# Patient Record
Sex: Female | Born: 1950 | Race: Black or African American | Hispanic: No | Marital: Married | Smoking: Former smoker
Health system: Southern US, Community
[De-identification: ages and names within clinical notes are randomized; demographics above are authoritative.]

## PROBLEM LIST (undated history)

## (undated) DIAGNOSIS — K635 Polyp of colon: Secondary | ICD-10-CM

## (undated) DIAGNOSIS — M1990 Unspecified osteoarthritis, unspecified site: Secondary | ICD-10-CM

## (undated) DIAGNOSIS — J4520 Mild intermittent asthma, uncomplicated: Secondary | ICD-10-CM

## (undated) DIAGNOSIS — Z87442 Personal history of urinary calculi: Secondary | ICD-10-CM

## (undated) HISTORY — PX: CYST EXCISION: SHX5701

## (undated) HISTORY — DX: Polyp of colon: K63.5

## (undated) HISTORY — PX: ECTOPIC PREGNANCY SURGERY: SHX613

## (undated) HISTORY — DX: Personal history of urinary calculi: Z87.442

## (undated) HISTORY — PX: COLONOSCOPY: SHX174

## (undated) HISTORY — PX: OVARIAN CYST REMOVAL: SHX89

---

## 1982-05-12 HISTORY — PX: TOTAL ABDOMINAL HYSTERECTOMY: SHX209

## 1998-11-29 ENCOUNTER — Other Ambulatory Visit: Admission: RE | Admit: 1998-11-29 | Discharge: 1998-11-29 | Payer: Self-pay | Admitting: *Deleted

## 1998-12-25 ENCOUNTER — Ambulatory Visit (HOSPITAL_COMMUNITY): Admission: RE | Admit: 1998-12-25 | Discharge: 1998-12-25 | Payer: Self-pay | Admitting: *Deleted

## 1999-01-15 ENCOUNTER — Emergency Department (HOSPITAL_COMMUNITY): Admission: EM | Admit: 1999-01-15 | Discharge: 1999-01-15 | Payer: Self-pay | Admitting: Emergency Medicine

## 1999-01-15 ENCOUNTER — Encounter: Payer: Self-pay | Admitting: Emergency Medicine

## 1999-01-26 ENCOUNTER — Other Ambulatory Visit: Admission: RE | Admit: 1999-01-26 | Discharge: 1999-01-26 | Payer: Self-pay | Admitting: *Deleted

## 1999-01-26 ENCOUNTER — Encounter (INDEPENDENT_AMBULATORY_CARE_PROVIDER_SITE_OTHER): Payer: Self-pay

## 2002-10-18 ENCOUNTER — Encounter: Payer: Self-pay | Admitting: Emergency Medicine

## 2002-10-18 ENCOUNTER — Emergency Department (HOSPITAL_COMMUNITY): Admission: EM | Admit: 2002-10-18 | Discharge: 2002-10-18 | Payer: Self-pay | Admitting: Emergency Medicine

## 2005-10-10 ENCOUNTER — Encounter (INDEPENDENT_AMBULATORY_CARE_PROVIDER_SITE_OTHER): Payer: Self-pay | Admitting: *Deleted

## 2005-10-10 ENCOUNTER — Ambulatory Visit (HOSPITAL_COMMUNITY): Admission: RE | Admit: 2005-10-10 | Discharge: 2005-10-10 | Payer: Self-pay | Admitting: Gastroenterology

## 2005-10-30 ENCOUNTER — Inpatient Hospital Stay (HOSPITAL_COMMUNITY): Admission: AD | Admit: 2005-10-30 | Discharge: 2005-10-30 | Payer: Self-pay | Admitting: Obstetrics and Gynecology

## 2005-12-03 ENCOUNTER — Ambulatory Visit (HOSPITAL_COMMUNITY): Admission: RE | Admit: 2005-12-03 | Discharge: 2005-12-03 | Payer: Self-pay | Admitting: Obstetrics & Gynecology

## 2006-02-17 ENCOUNTER — Encounter: Admission: RE | Admit: 2006-02-17 | Discharge: 2006-05-18 | Payer: Self-pay | Admitting: Internal Medicine

## 2006-07-31 ENCOUNTER — Emergency Department (HOSPITAL_COMMUNITY): Admission: EM | Admit: 2006-07-31 | Discharge: 2006-07-31 | Payer: Self-pay | Admitting: Family Medicine

## 2007-03-16 ENCOUNTER — Ambulatory Visit (HOSPITAL_COMMUNITY): Admission: RE | Admit: 2007-03-16 | Discharge: 2007-03-16 | Payer: Self-pay | Admitting: Obstetrics & Gynecology

## 2008-07-07 ENCOUNTER — Ambulatory Visit (HOSPITAL_COMMUNITY): Admission: RE | Admit: 2008-07-07 | Discharge: 2008-07-07 | Payer: Self-pay | Admitting: Obstetrics & Gynecology

## 2009-01-05 ENCOUNTER — Emergency Department (HOSPITAL_COMMUNITY): Admission: EM | Admit: 2009-01-05 | Discharge: 2009-01-05 | Payer: Self-pay | Admitting: Emergency Medicine

## 2009-07-12 ENCOUNTER — Ambulatory Visit (HOSPITAL_COMMUNITY): Admission: RE | Admit: 2009-07-12 | Discharge: 2009-07-12 | Payer: Self-pay | Admitting: Internal Medicine

## 2010-03-29 DIAGNOSIS — K635 Polyp of colon: Secondary | ICD-10-CM

## 2010-03-29 HISTORY — DX: Polyp of colon: K63.5

## 2010-07-22 ENCOUNTER — Other Ambulatory Visit: Payer: Self-pay | Admitting: Obstetrics & Gynecology

## 2010-07-22 DIAGNOSIS — Z1231 Encounter for screening mammogram for malignant neoplasm of breast: Secondary | ICD-10-CM

## 2010-07-31 ENCOUNTER — Ambulatory Visit (HOSPITAL_COMMUNITY)
Admission: RE | Admit: 2010-07-31 | Discharge: 2010-07-31 | Disposition: A | Discharge status: Home / Self Care | Payer: BC Managed Care – PPO | Source: Ambulatory Visit | Attending: Obstetrics & Gynecology | Admitting: Obstetrics & Gynecology

## 2010-07-31 DIAGNOSIS — Z1231 Encounter for screening mammogram for malignant neoplasm of breast: Secondary | ICD-10-CM | POA: Insufficient documentation

## 2010-09-27 NOTE — Op Note (Signed)
[name], [name]               ACCOUNT NO.:  [id number]   MEDICAL RECORD NO.:  [id number]          PATIENT TYPE:  AMB   LOCATION:  ENDO                         FACILITY:  [facility]   PHYSICIAN:  Anselmo Rod, M.D.  DATE OF BIRTH:  12-29-1950   DATE OF PROCEDURE:  10/10/2005  DATE OF DISCHARGE:                                 OPERATIVE REPORT   PROCEDURE:  Colonoscopy with snare polypectomy x2 and cold biopsies x8.   ENDOSCOPIST:  Anselmo Rod, M.D.   INSTRUMENT USED:  Olympus videocolonoscope.   INDICATIONS FOR PROCEDURE:  The patient is a [name] with a family history of cancerous polyp in her sister resulting in a  partial hemicolectomy.  She is undergoing screening colonoscopy to rule out  colonic polyps, masses, etc.   PREPROCEDURE PREPARATION:  Informed consent was procured from the patient.  The patient fasted for four hours prior to the procedure and prepped with  OsmoPrep pill the night of and the morning of the procedure.  The risks and  benefits of the procedure, including a 10% missed rate of cancer and polyp  were discussed with the patient as well.   PREPROCEDURE PHYSICAL EXAMINATION:  VITAL SIGNS:  Stable.  NECK:  Supple.  CHEST:  Clear to auscultation.  S1 and S2 regular.  ABDOMEN:  Obese, nontender, with normal bowel sounds.   DESCRIPTION OF PROCEDURE:  The patient was placed in the left lateral  decubitus position and sedated with a 175 mcg of Fentanyl and 14 mg of  Versed in slow incremental doses.  Once the patient was adequately sedated  and maintained on low-flow oxygen and continuous cardiac monitoring, the  Olympus videocolonoscope was advanced from the rectum to the proximal right  colon with extreme difficulty because of the patient's large abdomen.  The  patient's position was changed from the left lateral to the supine and right  lateral position and then the prone position on several occasions to reach  the cecal  base.  Gentle abdominal pressure was applied.  In spite of all of  these efforts, the cecal base was not visualized.  The IC valve was  visualized.  There was a prominent fold close the valve that was biopsied  for pathology.  Another prominent fold in the mid-right colon was biopsied  for pathology to rule out dysplasia.  There a single isolated diverticulum  seen in the proximal right colon.  The terminal ileum was not visualized.  Retroflexion in the rectum revealed no abnormalities.  A small sessile polyp  was removed via hot snare from the rectum.  A large pedunculated polyp was  snared from the mid-right colon and was removed by Amy [name] retrieval net, as it  could not be suctioned through the scope.  The patient tolerated the  procedure well without immediate complications.  It was a prolonged,  complicated procedure.   IMPRESSION:  1.  One sessile polyp removed via hot snare from the rectum.  2.  Large pedunculated polyp removed via hot snare with a cut and coag  technique (220) from the mid-right colon and retrieved by Amy [name] retrieval      net.  3.  Two prominent folds biopsied from the proximal and the mid-right colon.  4.  Cecal base not visualized.   RECOMMENDATIONS:  1.  Await pathology results.  2.  Avoid all nonsteroidals, including aspirin, for the next four weeks.  3.  Outpatient followup in the next two weeks for further recommendations.      Anselmo Rod, M.D.  Electronically Signed     JNM/MEDQ  D:  10/10/2005  T:  10/10/2005  Job:  [id number]   cc:   Amy [name], M.D.  Fax: [phone]

## 2010-10-28 ENCOUNTER — Ambulatory Visit (HOSPITAL_COMMUNITY)
Admission: RE | Admit: 2010-10-28 | Discharge: 2010-10-28 | Disposition: A | Discharge status: Home / Self Care | Payer: BC Managed Care – PPO | Source: Ambulatory Visit | Attending: Urology | Admitting: Urology

## 2010-10-28 DIAGNOSIS — E119 Type 2 diabetes mellitus without complications: Secondary | ICD-10-CM | POA: Insufficient documentation

## 2010-10-28 DIAGNOSIS — N200 Calculus of kidney: Secondary | ICD-10-CM | POA: Insufficient documentation

## 2010-10-28 LAB — CBC
HCT: 39.8 % (ref 36.0–46.0)
Hemoglobin: 13 g/dL (ref 12.0–15.0)
MCH: 27.8 pg (ref 26.0–34.0)
MCHC: 32.7 g/dL (ref 30.0–36.0)
MCV: 85.2 fL (ref 78.0–100.0)
Platelets: 248 10*3/uL (ref 150–400)
RBC: 4.67 MIL/uL (ref 3.87–5.11)
RDW: 12.9 % (ref 11.5–15.5)
WBC: 6.5 10*3/uL (ref 4.0–10.5)

## 2010-10-28 LAB — BASIC METABOLIC PANEL
BUN: 12 mg/dL (ref 6–23)
CO2: 26 mEq/L (ref 19–32)
Calcium: 9.8 mg/dL (ref 8.4–10.5)
Chloride: 105 mEq/L (ref 96–112)
Creatinine, Ser: 0.47 mg/dL — ABNORMAL LOW (ref 0.50–1.10)
GFR calc Af Amer: >60 mL/min (ref >60)
GFR calc non Af Amer: >60 mL/min (ref >60)
Glucose, Bld: 133 mg/dL — ABNORMAL HIGH (ref 70–99)
Potassium: 3.6 mEq/L (ref 3.5–5.1)
Sodium: 142 mEq/L (ref 135–145)

## 2010-10-28 LAB — GLUCOSE, CAPILLARY: Glucose-Capillary: 140 mg/dL — ABNORMAL HIGH (ref 70–99)

## 2011-05-20 ENCOUNTER — Emergency Department (HOSPITAL_COMMUNITY)
Admission: EM | Admit: 2011-05-20 | Discharge: 2011-05-20 | Disposition: A | Discharge status: Home / Self Care | Payer: BC Managed Care – PPO | Attending: Emergency Medicine | Admitting: Emergency Medicine

## 2011-05-20 ENCOUNTER — Encounter: Payer: Self-pay | Admitting: *Deleted

## 2011-05-20 DIAGNOSIS — Z79899 Other long term (current) drug therapy: Secondary | ICD-10-CM | POA: Insufficient documentation

## 2011-05-20 DIAGNOSIS — R002 Palpitations: Secondary | ICD-10-CM | POA: Insufficient documentation

## 2011-05-20 DIAGNOSIS — E119 Type 2 diabetes mellitus without complications: Secondary | ICD-10-CM | POA: Insufficient documentation

## 2011-05-20 DIAGNOSIS — M546 Pain in thoracic spine: Secondary | ICD-10-CM | POA: Insufficient documentation

## 2011-05-20 DIAGNOSIS — R079 Chest pain, unspecified: Secondary | ICD-10-CM | POA: Insufficient documentation

## 2011-05-20 DIAGNOSIS — R000 Tachycardia, unspecified: Secondary | ICD-10-CM | POA: Insufficient documentation

## 2011-05-20 LAB — CBC
HCT: 40.3 % (ref 36.0–46.0)
Hemoglobin: 13.4 g/dL (ref 12.0–15.0)
MCH: 28.7 pg (ref 26.0–34.0)
MCHC: 33.3 g/dL (ref 30.0–36.0)
MCV: 86.3 fL (ref 78.0–100.0)
Platelets: 223 10*3/uL (ref 150–400)
RBC: 4.67 MIL/uL (ref 3.87–5.11)
RDW: 13.4 % (ref 11.5–15.5)
WBC: 6.2 10*3/uL (ref 4.0–10.5)

## 2011-05-20 LAB — BASIC METABOLIC PANEL
BUN: 10 mg/dL (ref 6–23)
CO2: 26 mEq/L (ref 19–32)
Calcium: 9.6 mg/dL (ref 8.4–10.5)
Chloride: 104 mEq/L (ref 96–112)
Creatinine, Ser: 0.43 mg/dL — ABNORMAL LOW (ref 0.50–1.10)
GFR calc Af Amer: >90 mL/min (ref >90)
GFR calc non Af Amer: >90 mL/min (ref >90)
Glucose, Bld: 137 mg/dL — ABNORMAL HIGH (ref 70–99)
Potassium: 3.7 mEq/L (ref 3.5–5.1)
Sodium: 140 mEq/L (ref 135–145)

## 2011-05-20 LAB — POCT I-STAT TROPONIN I: Troponin i, poc: 0 ng/mL (ref 0.00–0.08)

## 2011-05-20 LAB — DIFFERENTIAL
Basophils Absolute: 0 10*3/uL (ref 0.0–0.1)
Basophils Relative: 1 % (ref 0–1)
Eosinophils Absolute: 0.2 10*3/uL (ref 0.0–0.7)
Eosinophils Relative: 4 % (ref 0–5)
Lymphocytes Relative: 33 % (ref 12–46)
Lymphs Abs: 2 10*3/uL (ref 0.7–4.0)
Monocytes Absolute: 0.4 10*3/uL (ref 0.1–1.0)
Monocytes Relative: 7 % (ref 3–12)
Neutro Abs: 3.5 10*3/uL (ref 1.7–7.7)
Neutrophils Relative %: 56 % (ref 43–77)

## 2011-05-20 LAB — D-DIMER, QUANTITATIVE: D-Dimer, Quant: <0.22 ug/mL-FEU (ref 0.00–0.48)

## 2011-05-20 NOTE — ED Notes (Signed)
States last weekend c/o increased heartrate when lying down. States this has been going on x 3-4 weeks. Lasted longer today. C/o pain in her right upper rib and posterior scapula area.

## 2011-05-20 NOTE — ED Provider Notes (Signed)
History     CSN: [id number]  Arrival date & time 05/20/11  0536   First MD Initiated Contact with Patient 05/20/11 [phone]      Chief Complaint  Patient presents with  . Tachycardia    (Consider location/radiation/quality/duration/timing/severity/associated sxs/prior treatment) Patient is a 61 y.o. female presenting with palpitations. The history is provided by the patient.  Palpitations  This is a new problem. The current episode started more than 2 days ago. The problem occurs every several days. The problem has not changed since onset.The problem is associated with stress. On average, each episode lasts 5 minutes. Associated symptoms include irregular heartbeat and back pain. Pertinent negatives include no diaphoresis, no fever, no chest pain, no chest pressure, no exertional chest pressure, no near-syncope, no syncope, no abdominal pain, no nausea, no vomiting, no headaches, no leg pain, no lower extremity edema, no dizziness, no weakness, no cough, no hemoptysis and no shortness of breath. Risk factors include no known risk factors. Her past medical history does not include heart disease.   States she has two to three episodes in the past several weeks of palpitations. These have occurred at night when lying down. States that the first was a sensation as if her heart was racing and the other two made her feel as if her heart was pounding in her chest. The episodes each lasted for less than 5 minutes. There are no known aggravating/alleviating factors. She has had no associated chest pain, tightness, shortness of breath. Denies nausea, vomiting, diaphoresis. She has never had anything like this before.   Additionally, she had a brief period yesterday of right sided mid rib pain. This was described as sharp and stabbing and lasted for several seconds. She later had some pain in the right upper back. This pain resolved and has not recurred since.  Patient has no prior history of DVT/PE. Denies  recent trauma, surgery, or prolonged immobilization. Denies hemoptysis or use of exogenous estrogen. Has not had any leg swelling.  Past Medical History  Diagnosis Date  . Diabetes mellitus     Past Surgical History  Procedure Date  . Abdominal hysterectomy   . Ectopic pregnancy surgery     History reviewed. No pertinent family history.  History  Substance Use Topics  . Smoking status: Former Games developer  . Smokeless tobacco: Not on file  . Alcohol Use: Yes    OB History    Grav Para Term Preterm Abortions TAB SAB Ect Mult Living                  Review of Systems  Constitutional: Negative for fever, diaphoresis and appetite change.  HENT: Negative for congestion, rhinorrhea and trouble swallowing.   Eyes: Negative for visual disturbance.  Respiratory: Negative for cough, hemoptysis, chest tightness and shortness of breath.   Cardiovascular: Positive for palpitations. Negative for chest pain, leg swelling, syncope and near-syncope.  Gastrointestinal: Negative for nausea, vomiting and abdominal pain.  Genitourinary: Negative for dysuria.  Musculoskeletal: Positive for back pain.  Neurological: Negative for dizziness, weakness and headaches.    Allergies  Review of patient's allergies indicates no known allergies.  Home Medications   Current Outpatient Rx  Name Route Sig Dispense Refill  . DOCUSATE SODIUM 100 MG PO CAPS Oral Take 100 mg by mouth 2 (two) times daily.      Marland Kitchen METFORMIN HCL 500 MG PO TABS Oral Take 500 mg by mouth daily with breakfast.     . CALCIUM POLYCARBOPHIL 625  MG PO TABS Oral Take 625 mg by mouth daily.        BP 128/86  Pulse 72  Temp(Src) 98.3 F (36.8 C) (Oral)  Resp 19  SpO2 97%  Physical Exam  Nursing note and vitals reviewed. Constitutional: She appears well-developed and well-nourished. No distress.  HENT:  Head: Normocephalic and atraumatic.  Eyes: Conjunctivae and EOM are normal. Right eye exhibits no discharge. Left eye exhibits  no discharge.  Neck: Normal range of motion. Neck supple.  Cardiovascular: Normal rate, regular rhythm and normal heart sounds.   Pulmonary/Chest: Effort normal and breath sounds normal. No respiratory distress. She has no wheezes. She has no rales. She exhibits no tenderness.  Abdominal: Soft. Bowel sounds are normal. She exhibits no distension and no mass. There is no tenderness. There is no rebound, no guarding and negative Murphy's sign.  Musculoskeletal: Normal range of motion. She exhibits no edema.  Neurological: She is alert.  Skin: Skin is warm and dry. She is not diaphoretic.  Psychiatric: She has a normal mood and affect.    ED Course  Procedures (including critical care time)   Date: 05/20/2011  Rate: 73  Rhythm: normal sinus rhythm  QRS Axis: normal  Intervals: normal  ST/T Wave abnormalities: normal  Conduction Disutrbances:none  Narrative Interpretation:   Old EKG Reviewed: as compared with June 2004, no significant changes  Labs Reviewed  BASIC METABOLIC PANEL - Abnormal; Notable for the following:    Glucose, Bld 137 (*)    Creatinine, Ser 0.43 (*)    All other components within normal limits  CBC  DIFFERENTIAL  D-DIMER, QUANTITATIVE  POCT I-STAT TROPONIN I  I-STAT TROPONIN I   No results found.   1. Palpitations       MDM  Patient presents with complaint of racing heart; she has had no associated chest pain or shortness of breath or diaphoresis. Her labs, including d-dimer and troponin, were negative. ECG was unchanged from previous. Doubt ACS. Will have her follow up with cardiology for possible event monitoring. Findings discussed with pt. She verbalized understanding and agreed to plan.        [place], [place] 05/20/11 [phone]

## 2011-05-20 NOTE — ED Notes (Signed)
Patient with feeling of heart racing all night last night.  Patient denies any CP, shortness of breath, nausea or vomiting.  No diaphoresis.  Patient states she does not have a fast heart rate at this time.  Patient states she is feeling like it is too slow at this time.  Patient is on cardiac monitor, NSR, rate in 70's.  She is CAOx3, ambulatory.

## 2011-05-20 NOTE — ED Notes (Signed)
Pt d/c home with spouse. NAD noted at this time. Pt states she will follow up with Cardiology later in the week.

## 2011-05-20 NOTE — ED Notes (Signed)
Pt states she doesn't feel like her heart is racing, but she does feel "like it's not pumping right." Pt NSR on monitor.

## 2011-05-20 NOTE — ED Provider Notes (Signed)
Medical screening examination/treatment/procedure(s) were performed by non-physician practitioner and as supervising physician I was immediately available for consultation/collaboration.   Krystal Delduca, MD 05/20/11 1816 

## 2011-08-18 ENCOUNTER — Other Ambulatory Visit (HOSPITAL_COMMUNITY): Payer: Self-pay | Admitting: *Deleted

## 2011-08-18 DIAGNOSIS — Z1231 Encounter for screening mammogram for malignant neoplasm of breast: Secondary | ICD-10-CM

## 2011-09-12 ENCOUNTER — Ambulatory Visit (HOSPITAL_COMMUNITY)
Admission: RE | Admit: 2011-09-12 | Discharge: 2011-09-12 | Disposition: A | Discharge status: Home / Self Care | Payer: BC Managed Care – PPO | Source: Ambulatory Visit | Attending: *Deleted | Admitting: *Deleted

## 2011-09-12 DIAGNOSIS — Z1231 Encounter for screening mammogram for malignant neoplasm of breast: Secondary | ICD-10-CM | POA: Insufficient documentation

## 2011-09-17 ENCOUNTER — Other Ambulatory Visit: Payer: Self-pay | Admitting: *Deleted

## 2011-09-17 DIAGNOSIS — R928 Other abnormal and inconclusive findings on diagnostic imaging of breast: Secondary | ICD-10-CM

## 2011-09-19 ENCOUNTER — Ambulatory Visit
Admission: RE | Admit: 2011-09-19 | Discharge: 2011-09-19 | Disposition: A | Discharge status: Home / Self Care | Payer: BC Managed Care – PPO | Source: Ambulatory Visit | Attending: *Deleted | Admitting: *Deleted

## 2011-09-19 DIAGNOSIS — R928 Other abnormal and inconclusive findings on diagnostic imaging of breast: Secondary | ICD-10-CM

## 2011-11-18 ENCOUNTER — Ambulatory Visit (INDEPENDENT_AMBULATORY_CARE_PROVIDER_SITE_OTHER): Payer: BC Managed Care – PPO | Admitting: Gastroenterology

## 2011-11-18 ENCOUNTER — Encounter: Payer: Self-pay | Admitting: Gastroenterology

## 2011-11-18 VITALS — BP 110/68 | HR 80 | Ht 67.0 in | Wt 225.1 lb

## 2011-11-18 DIAGNOSIS — K625 Hemorrhage of anus and rectum: Secondary | ICD-10-CM

## 2011-11-18 DIAGNOSIS — K644 Residual hemorrhoidal skin tags: Secondary | ICD-10-CM

## 2011-11-18 MED ORDER — HYDROCORTISONE ACE-PRAMOXINE 2.5-1 % RE CREA: TOPICAL_CREAM | Freq: Three times a day (TID) | RECTAL | Status: AC

## 2011-11-18 NOTE — Patient Instructions (Addendum)
Start a fiber supplement such as Metamucil daily which is over the counter. Start Analpram daily for rectal pain. A prescription has been sent to your pharmacy.  We will send this note to Dr. Bosie Clos for continuation of care.   High Fiber Diet A high fiber diet changes your normal diet to include more whole grains, legumes, fruits, and vegetables. Changes in the diet involve replacing refined carbohydrates with unrefined foods. The calorie level of the diet is essentially unchanged. The Dietary Reference Intake (recommended amount) for adult males is 38 g per day. For adult females, it is 25 g per day. Pregnant and lactating women should consume 28 g of fiber per day. Fiber is the intact part of a plant that is not broken down during digestion. Functional fiber is fiber that has been isolated from the plant to provide a beneficial effect in the body. PURPOSE  Increase stool bulk.   Ease and regulate bowel movements.   Lower cholesterol.  INDICATIONS THAT YOU NEED MORE FIBER  Constipation and hemorrhoids.   Uncomplicated diverticulosis (intestine condition) and irritable bowel syndrome.   Weight management.   As a protective measure against hardening of the arteries (atherosclerosis), diabetes, and cancer.  NOTE OF CAUTION If you have a digestive or bowel problem, ask your caregiver for advice before adding high fiber foods to your diet. Some of the following medical problems are such that a high fiber diet should not be used without consulting your caregiver:  Acute diverticulitis (intestine infection).   Partial small bowel obstructions.   Complicated diverticular disease involving bleeding, rupture (perforation), or abscess (boil, furuncle).   Presence of autonomic neuropathy (nerve damage) or gastric paresis (stomach cannot empty itself).  GUIDELINES FOR INCREASING FIBER  Start adding fiber to the diet slowly. A gradual increase of about 5 more grams (2 slices of whole-wheat  bread, 2 servings of most fruits or vegetables, or 1 bowl of high fiber cereal) per day is best. Too rapid an increase in fiber may result in constipation, flatulence, and bloating.   Drink enough water and fluids to keep your urine clear or pale yellow. Water, juice, or caffeine-free drinks are recommended. Not drinking enough fluid may cause constipation.   Eat a variety of high fiber foods rather than one type of fiber.   Try to increase your intake of fiber through using high fiber foods rather than fiber pills or supplements that contain small amounts of fiber.   The goal is to change the types of food eaten. Do not supplement your present diet with high fiber foods, but replace foods in your present diet.  INCLUDE A VARIETY OF FIBER SOURCES  Replace refined and processed grains with whole grains, canned fruits with fresh fruits, and incorporate other fiber sources. White rice, white breads, and most bakery goods contain little or no fiber.   Brown whole-grain rice, buckwheat oats, and many fruits and vegetables are all good sources of fiber. These include: broccoli, Brussels sprouts, cabbage, cauliflower, beets, sweet potatoes, white potatoes (skin on), carrots, tomatoes, eggplant, squash, berries, fresh fruits, and dried fruits.   Cereals appear to be the richest source of fiber. Cereal fiber is found in whole grains and bran. Bran is the fiber-rich outer coat of cereal grain, which is largely removed in refining. In whole-grain cereals, the bran remains. In breakfast cereals, the largest amount of fiber is found in those with "bran" in their names. The fiber content is sometimes indicated on the label.   You  may need to include additional fruits and vegetables each day.   In baking, for 1 cup white flour, you may use the following substitutions:   1 cup whole-wheat flour minus 2 tbs.    cup white flour plus  cup whole-wheat flour.  Document Released: 04/28/2005 Document Revised:  04/17/2011 Document Reviewed: 03/06/2009 [facility] Patient Information 2012 [place], [place].  Hemorrhoids Hemorrhoids are enlarged (dilated) veins around the rectum. There are 2 types of hemorrhoids, and the type of hemorrhoid is determined by its location. Internal hemorrhoids occur in the veins just inside the rectum.They are usually not painful, but they may bleed.However, they may poke through to the outside and become irritated and painful. External hemorrhoids involve the veins outside the anus and can be felt as a painful swelling or hard lump near the anus.They are often itchy and may crack and bleed. Sometimes clots will form in the veins. This makes them swollen and painful. These are called thrombosed hemorrhoids. CAUSES Causes of hemorrhoids include:  Pregnancy. This increases the pressure in the hemorrhoidal veins.   Constipation.   Straining to have a bowel movement.   Obesity.   Heavy lifting or other activity that caused you to strain.  TREATMENT Most of the time hemorrhoids improve in 1 to 2 weeks. However, if symptoms do not seem to be getting better or if you have a lot of rectal bleeding, your caregiver may perform a procedure to help make the hemorrhoids get smaller or remove them completely.Possible treatments include:  Rubber band ligation. A rubber band is placed at the base of the hemorrhoid to cut off the circulation.   Sclerotherapy. A chemical is injected to shrink the hemorrhoid.   Infrared light therapy. Tools are used to burn the hemorrhoid.   Hemorrhoidectomy. This is surgical removal of the hemorrhoid.  HOME CARE INSTRUCTIONS   Increase fiber in your diet. Ask your caregiver about using fiber supplements.   Drink enough water and fluids to keep your urine clear or pale yellow.   Exercise regularly.   Go to the bathroom when you have the urge to have a bowel movement. Do not wait.   Avoid straining to have bowel movements.   Keep the anal  area dry and clean.   Only take over-the-counter or prescription medicines for pain, discomfort, or fever as directed by your caregiver.  If your hemorrhoids are thrombosed:  Take warm sitz baths for 20 to 30 minutes, 3 to 4 times per day.   If the hemorrhoids are very tender and swollen, place ice packs on the area as tolerated. Using ice packs between sitz baths may be helpful. Fill a plastic bag with ice. Place a towel between the bag of ice and your skin.   Medicated creams and suppositories may be used or applied as directed.   Do not use a donut-shaped pillow or sit on the toilet for long periods. This increases blood pooling and pain.  SEEK MEDICAL CARE IF:   You have increasing pain and swelling that is not controlled with your medicine.   You have uncontrolled bleeding.   You have difficulty or you are unable to have a bowel movement.   You have pain or inflammation outside the area of the hemorrhoids.   You have chills or an oral temperature above 102 F (38.9 C).  MAKE SURE YOU:   Understand these instructions.   Will watch your condition.   Will get help right away if you are not doing well or  get worse.  Document Released: 04/25/2000 Document Revised: 04/17/2011 Document Reviewed: 08/31/2007 [facility] Patient Information 2012 [place], [place].

## 2011-11-18 NOTE — Progress Notes (Signed)
History of Present Illness:  This is a 61 year old African American female referred by [facility] for evaluation of asymptomatic rectal bleeding that occurred on June 15. Evaluation that time showed external hemorrhoids. She currently is asymptomatic and denies rectal bleeding, rectal or abdominal pain. She has had 2 colonoscopies by Dr. Bosie Clos at [facility] that have been normal. Her records were reviewed from outside sources. She does have type 2 diabetes and is on metformin. Family history is noncontributory. She denies systemic complaints, anorexia, weight loss, or any specific food intolerances.  I have reviewed this patient's present history, medical and surgical past history, allergies and medications.     ROS: The remainder of the 10 point ROS is negative     Physical Exam: Blood pressure 110/68, pulse 80 and regular, weight 225 pounds with BMI of 35.26. General well developed well nourished patient in no acute distress, appearing her stated age Eyes PERRLA, no icterus, fundoscopic exam per opthamologist Skin no lesions noted Neck supple, no adenopathy, no thyroid enlargement, no tenderness Chest clear to percussion and auscultation Heart no significant murmurs, gallops or rubs noted Abdomen no hepatosplenomegaly masses or tenderness, BS normal.  Rectal inspection normal no fissures, or fistulae noted.  No masses or tenderness on digital exam. Stool guaiac negative. Posterior external hemorrhoid noted without stigmata of recent bleeding. No rectal masses or tenderness noted. Extremities no acute joint lesions, edema, phlebitis or evidence of cellulitis. Neurologic patient oriented x 3, cranial nerves intact, no focal neurologic deficits noted. Psychological mental status normal and normal affect.  Assessment and plan: Mild constipation with hemorrhoidal bleeding which has subsided. Review of her labs show normal CBC. I see no need for followup colonoscopy, but have asked her to return  to the care of Dr. Bosie Clos who is her gastroenterologist of record. I have placed her on a high fiber diet, daily Metamucil, and when necessary sitz baths and Analpram cream. Also information concerning hemorrhoids and their management supplied. Please copy this note to Dr. Charlott Rakes.  No diagnosis found.

## 2012-02-23 ENCOUNTER — Other Ambulatory Visit: Payer: Self-pay | Admitting: Family Medicine

## 2012-02-23 DIAGNOSIS — R921 Mammographic calcification found on diagnostic imaging of breast: Secondary | ICD-10-CM

## 2012-03-22 ENCOUNTER — Ambulatory Visit
Admission: RE | Admit: 2012-03-22 | Discharge: 2012-03-22 | Disposition: A | Discharge status: Home / Self Care | Payer: BC Managed Care – PPO | Source: Ambulatory Visit | Attending: Family Medicine | Admitting: Family Medicine

## 2012-03-22 DIAGNOSIS — R921 Mammographic calcification found on diagnostic imaging of breast: Secondary | ICD-10-CM

## 2012-09-29 ENCOUNTER — Emergency Department (HOSPITAL_COMMUNITY)
Admission: EM | Admit: 2012-09-29 | Discharge: 2012-09-29 | Disposition: A | Discharge status: Home / Self Care | Payer: BC Managed Care – PPO | Attending: Emergency Medicine | Admitting: Emergency Medicine

## 2012-09-29 ENCOUNTER — Encounter (HOSPITAL_COMMUNITY): Payer: Self-pay | Admitting: *Deleted

## 2012-09-29 DIAGNOSIS — R109 Unspecified abdominal pain: Secondary | ICD-10-CM | POA: Insufficient documentation

## 2012-09-29 DIAGNOSIS — Z860100 Personal history of colon polyps, unspecified: Secondary | ICD-10-CM | POA: Insufficient documentation

## 2012-09-29 DIAGNOSIS — M5430 Sciatica, unspecified side: Secondary | ICD-10-CM | POA: Insufficient documentation

## 2012-09-29 DIAGNOSIS — Z87891 Personal history of nicotine dependence: Secondary | ICD-10-CM | POA: Insufficient documentation

## 2012-09-29 DIAGNOSIS — Z87442 Personal history of urinary calculi: Secondary | ICD-10-CM | POA: Insufficient documentation

## 2012-09-29 DIAGNOSIS — E119 Type 2 diabetes mellitus without complications: Secondary | ICD-10-CM | POA: Insufficient documentation

## 2012-09-29 DIAGNOSIS — M5431 Sciatica, right side: Secondary | ICD-10-CM

## 2012-09-29 LAB — CBC WITH DIFFERENTIAL/PLATELET
Basophils Absolute: 0 10*3/uL (ref 0.0–0.1)
Basophils Relative: 0 % (ref 0–1)
Eosinophils Absolute: 0.3 10*3/uL (ref 0.0–0.7)
Eosinophils Relative: 5 % (ref 0–5)
HCT: 41.2 % (ref 36.0–46.0)
Hemoglobin: 13.9 g/dL (ref 12.0–15.0)
Lymphocytes Relative: 34 % (ref 12–46)
Lymphs Abs: 1.7 10*3/uL (ref 0.7–4.0)
MCH: 28.8 pg (ref 26.0–34.0)
MCHC: 33.7 g/dL (ref 30.0–36.0)
MCV: 85.5 fL (ref 78.0–100.0)
Monocytes Absolute: 0.3 10*3/uL (ref 0.1–1.0)
Monocytes Relative: 6 % (ref 3–12)
Neutro Abs: 2.8 10*3/uL (ref 1.7–7.7)
Neutrophils Relative %: 55 % (ref 43–77)
Platelets: 223 10*3/uL (ref 150–400)
RBC: 4.82 MIL/uL (ref 3.87–5.11)
RDW: 12.8 % (ref 11.5–15.5)
WBC: 5.1 10*3/uL (ref 4.0–10.5)

## 2012-09-29 LAB — URINALYSIS, ROUTINE W REFLEX MICROSCOPIC
Bilirubin Urine: NEGATIVE
Glucose, UA: NEGATIVE mg/dL
Hgb urine dipstick: NEGATIVE
Ketones, ur: NEGATIVE mg/dL
Leukocytes, UA: NEGATIVE
Nitrite: NEGATIVE
Protein, ur: NEGATIVE mg/dL
Specific Gravity, Urine: 1.017 (ref 1.005–1.030)
Urobilinogen, UA: 0.2 mg/dL (ref 0.0–1.0)
pH: 5 (ref 5.0–8.0)

## 2012-09-29 LAB — POCT I-STAT, CHEM 8
BUN: 10 mg/dL (ref 6–23)
Calcium, Ion: 1.25 mmol/L (ref 1.13–1.30)
Chloride: 107 mEq/L (ref 96–112)
Creatinine, Ser: 0.6 mg/dL (ref 0.50–1.10)
Glucose, Bld: 129 mg/dL — ABNORMAL HIGH (ref 70–99)
HCT: 43 % (ref 36.0–46.0)
Hemoglobin: 14.6 g/dL (ref 12.0–15.0)
Potassium: 3.9 mEq/L (ref 3.5–5.1)
Sodium: 142 mEq/L (ref 135–145)
TCO2: 29 mmol/L (ref 0–100)

## 2012-09-29 MED ORDER — OXYCODONE-ACETAMINOPHEN 5-325 MG PO TABS: ORAL_TABLET | ORAL | Status: DC

## 2012-09-29 MED ORDER — CYCLOBENZAPRINE HCL 10 MG PO TABS: 10.0000 mg | ORAL_TABLET | Freq: Two times a day (BID) | ORAL | Status: DC | PRN

## 2012-09-29 MED ORDER — OXYCODONE-ACETAMINOPHEN 5-325 MG PO TABS
1.0000 | ORAL_TABLET | Freq: Once | ORAL | Status: AC
  Administered 2012-09-29: 1 via ORAL
  Filled 2012-09-29: qty 1

## 2012-09-29 NOTE — ED Notes (Signed)
Per pt report: Pt reports on Sunday, pt's back began to feel sore.  On Tuesday the pain got worse.  Pt states that this morning, the pain was so severe that it caused her feel short of breath.  Pt reports she is able to walk but walks bending over.  Pt reports pain in back shoots down to her legs.  Pt denies loss of ability of bowel movements or numbness in legs.  Pt a/o x 4 and in no acute distress.  Skin warm and dry.  No deformites or injuries noted on back.  Pt's back not tender upon palpation.

## 2012-09-29 NOTE — ED Provider Notes (Signed)
Medical screening examination/treatment/procedure(s) were performed by non-physician practitioner and as supervising physician I was immediately available for consultation/collaboration.   Charles B. Sheldon, MD 09/29/12 1145 

## 2012-09-29 NOTE — ED Provider Notes (Signed)
History     CSN: [id number]  Arrival date & time 09/29/12  0620   First MD Initiated Contact with Patient 09/29/12 [phone]      No chief complaint on file.   (Consider location/radiation/quality/duration/timing/severity/associated sxs/prior treatment) HPI Comments: 62 y.o. Female with PMHx of lower back pain, kidney stones, DM2 presents with gradual onset lower back pain radiating down the back of both legs. Pain is described as sharp, worse with movement, better with rest, with a severity of 7/10 when present. Pt denies fever, hematuria, dysuria, bowel/bladder incontinence, hx of cancer, constipation, diarrhea, nausea, vomiting.   Hx of stones involves right sided lithotripsy a number of years ago. Was told at that time that she had a small stone on the left that would be watched, but pt never followed up with primary care. Pt states she does have a PCP who recently retired and cannot remember the new physician's name.   Pt also describes sciatica-like sx over a number of years, but has never seen a doctor or been treated for it.    Past Medical History  Diagnosis Date  . Diabetes mellitus   . Colon polyp, hyperplastic 03/29/2010  . History of kidney stones     Past Surgical History  Procedure Laterality Date  . Abdominal hysterectomy    . Ectopic pregnancy surgery      Family History  Problem Relation Age of Onset  . Diabetes Father   . Vaginal cancer Maternal Grandmother   . Heart disease Maternal Grandfather     History  Substance Use Topics  . Smoking status: Former Smoker    Types: Cigarettes  . Smokeless tobacco: Never Used  . Alcohol Use: Yes     Comment: ocass    OB History   Grav Para Term Preterm Abortions TAB SAB Ect Mult Living                  Review of Systems  Constitutional: Negative for fever, chills and diaphoresis.  HENT: Negative for neck pain and neck stiffness.   Eyes: Negative for visual disturbance.  Respiratory: Negative for apnea,  chest tightness and shortness of breath.   Cardiovascular: Negative for chest pain and palpitations.  Gastrointestinal: Negative for nausea, vomiting, abdominal pain, diarrhea and constipation.  Genitourinary: Positive for flank pain. Negative for dysuria, frequency and hematuria.       Left side  Musculoskeletal: Positive for back pain. Negative for gait problem.       Bilateral  Skin: Negative for rash.  Neurological: Negative for dizziness, weakness, light-headedness, numbness and headaches.    Allergies  Review of patient's allergies indicates no known allergies.  Home Medications   Current Outpatient Rx  Name  Route  Sig  Dispense  Refill  . metFORMIN (GLUCOPHAGE) 500 MG tablet   Oral   Take 500 mg by mouth daily with breakfast.            BP 130/78  Pulse 66  Temp(Src) 98.3 F (36.8 C) (Oral)  Resp 18  Ht 5\' 8"  (1.727 m)  Wt 220 lb (99.791 kg)  BMI 33.46 kg/m2  SpO2 95%  Physical Exam  Nursing note and vitals reviewed. Constitutional: She is oriented to person, place, and time. She appears well-developed and well-nourished. No distress.  HENT:  Head: Normocephalic and atraumatic.  Eyes: Conjunctivae and EOM are normal.  Neck: Normal range of motion. Neck supple.  No meningeal signs  Cardiovascular: Normal rate, regular rhythm and normal heart sounds.  Exam reveals no gallop and no friction rub.   No murmur heard. Pulmonary/Chest: Effort normal and breath sounds normal. No respiratory distress. She has no wheezes. She has no rales. She exhibits no tenderness.  Abdominal: Soft. Bowel sounds are normal. She exhibits no distension. There is no tenderness. There is no rebound and no guarding.  Musculoskeletal: Normal range of motion. She exhibits no edema and no tenderness.  FROM to upper and lower extremities No step-offs noted on C-spine No tenderness to palpation of the spinous processes of the C-spine, T-spine or L-spine Full range of motion of C-spine,  T-spine or L-spine Mild tenderness to palpation of the paraspinous muscles   Neurological: She is alert and oriented to person, place, and time. No cranial nerve deficit.  Speech is clear and goal oriented, follows commands Sensation normal to light touch and two point discrimination Moves extremities without ataxia, coordination intact Normal gait and balance Normal strength in upper and lower extremities bilaterally including dorsiflexion and plantar flexion, strong and equal grip strength   Skin: Skin is warm and dry. She is not diaphoretic. No erythema.  Psychiatric: She has a normal mood and affect.    ED Course  Procedures (including critical care time) Medications  oxyCODONE-acetaminophen (PERCOCET/ROXICET) 5-325 MG per tablet 1 tablet (1 tablet Oral Given 09/29/12 0726)    Labs Reviewed  URINALYSIS, ROUTINE W REFLEX MICROSCOPIC - Abnormal; Notable for the following:    APPearance CLOUDY (*)    All other components within normal limits  POCT I-STAT, CHEM 8 - Abnormal; Notable for the following:    Glucose, Bld 129 (*)    All other components within normal limits  CBC WITH DIFFERENTIAL   No results found.   1. Bilateral sciatica       MDM  Patient with bilateral back pain, L>R.  No neurological deficits and normal neuro exam.  Patient can walk but states is painful.  No loss of bowel or bladder control.  No concern for cauda equina.  No fever, night sweats, weight loss, h/o cancer, IVDU.   At this time there does not appear to be any evidence of an acute emergency medical condition and the patient appears stable for discharge with appropriate outpatient follow up.Diagnosis was discussed with patient who verbalizes understanding and is agreeable to discharge. Pt case discussed with Dr. Bernette Mayers who agrees with plan.    Glade Nurse, PA-C 09/29/12 1138

## 2012-10-27 ENCOUNTER — Other Ambulatory Visit: Payer: Self-pay | Admitting: Family Medicine

## 2012-10-27 DIAGNOSIS — R921 Mammographic calcification found on diagnostic imaging of breast: Secondary | ICD-10-CM

## 2012-11-17 ENCOUNTER — Ambulatory Visit
Admission: RE | Admit: 2012-11-17 | Discharge: 2012-11-17 | Disposition: A | Discharge status: Home / Self Care | Payer: BC Managed Care – PPO | Source: Ambulatory Visit | Attending: Family Medicine | Admitting: Family Medicine

## 2012-11-17 DIAGNOSIS — R921 Mammographic calcification found on diagnostic imaging of breast: Secondary | ICD-10-CM

## 2013-02-02 ENCOUNTER — Other Ambulatory Visit: Payer: Self-pay | Admitting: Orthopaedic Surgery

## 2013-02-02 ENCOUNTER — Other Ambulatory Visit: Payer: BC Managed Care – PPO

## 2013-02-02 DIAGNOSIS — M545 Low back pain, unspecified: Secondary | ICD-10-CM

## 2013-02-04 ENCOUNTER — Ambulatory Visit
Admission: RE | Admit: 2013-02-04 | Discharge: 2013-02-04 | Disposition: A | Discharge status: Home / Self Care | Payer: BC Managed Care – PPO | Source: Ambulatory Visit | Attending: Orthopaedic Surgery | Admitting: Orthopaedic Surgery

## 2013-02-04 DIAGNOSIS — M545 Low back pain, unspecified: Secondary | ICD-10-CM

## 2013-08-10 ENCOUNTER — Ambulatory Visit (INDEPENDENT_AMBULATORY_CARE_PROVIDER_SITE_OTHER): Payer: BC Managed Care – PPO | Admitting: Gynecology

## 2013-08-10 ENCOUNTER — Encounter: Payer: Self-pay | Admitting: Gynecology

## 2013-08-10 VITALS — BP 114/82 | HR 70 | Resp 14 | Ht 68.0 in | Wt 238.0 lb

## 2013-08-10 DIAGNOSIS — Z87442 Personal history of urinary calculi: Secondary | ICD-10-CM | POA: Insufficient documentation

## 2013-08-10 DIAGNOSIS — R82998 Other abnormal findings in urine: Secondary | ICD-10-CM

## 2013-08-10 DIAGNOSIS — Z13820 Encounter for screening for osteoporosis: Secondary | ICD-10-CM

## 2013-08-10 DIAGNOSIS — R8290 Unspecified abnormal findings in urine: Secondary | ICD-10-CM

## 2013-08-10 DIAGNOSIS — E119 Type 2 diabetes mellitus without complications: Secondary | ICD-10-CM | POA: Insufficient documentation

## 2013-08-10 DIAGNOSIS — Z0000 Encounter for general adult medical examination without abnormal findings: Secondary | ICD-10-CM

## 2013-08-10 DIAGNOSIS — Z01419 Encounter for gynecological examination (general) (routine) without abnormal findings: Secondary | ICD-10-CM

## 2013-08-10 LAB — POCT WET PREP (WET MOUNT): Clue Cells Wet Prep Whiff POC: NEGATIVE

## 2013-08-10 LAB — POCT URINALYSIS DIPSTICK
Leukocytes, UA: NEGATIVE
Urobilinogen, UA: NEGATIVE
pH, UA: 7
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
## 2013-08-10 NOTE — Patient Instructions (Signed)

## 2013-08-11 LAB — URINALYSIS, MICROSCOPIC ONLY
Bacteria, UA: NONE SEEN
Casts: NONE SEEN
Crystals: NONE SEEN

## 2013-08-12 LAB — URINE CULTURE

## 2013-08-15 ENCOUNTER — Telehealth: Payer: Self-pay | Admitting: *Deleted

## 2013-08-15 NOTE — Telephone Encounter (Signed)
Message copied by Alfonzo Feller on Mon Aug 15, 2013 12:17 PM ------      Message from: Elveria Rising      Created: Mon Aug 15, 2013 11:17 AM       Inform no infection ------

## 2013-08-15 NOTE — Telephone Encounter (Signed)
Left Message To Call Back  

## 2013-08-16 NOTE — Telephone Encounter (Signed)
Patient notified (see results)

## 2013-08-16 NOTE — Telephone Encounter (Signed)
Left Message To Call Back  

## 2013-08-16 NOTE — Telephone Encounter (Signed)
Patient is returning a call to Jasmine °

## 2013-11-30 ENCOUNTER — Other Ambulatory Visit: Payer: Self-pay | Admitting: Family Medicine

## 2013-11-30 DIAGNOSIS — R921 Mammographic calcification found on diagnostic imaging of breast: Secondary | ICD-10-CM

## 2013-12-05 ENCOUNTER — Ambulatory Visit: Payer: BC Managed Care – PPO | Admitting: Podiatry

## 2013-12-21 ENCOUNTER — Ambulatory Visit: Payer: BC Managed Care – PPO | Admitting: Podiatry

## 2013-12-29 ENCOUNTER — Ambulatory Visit
Admission: RE | Admit: 2013-12-29 | Discharge: 2013-12-29 | Disposition: A | Discharge status: Home / Self Care | Payer: BC Managed Care – PPO | Source: Ambulatory Visit | Attending: Family Medicine | Admitting: Family Medicine

## 2013-12-29 DIAGNOSIS — R921 Mammographic calcification found on diagnostic imaging of breast: Secondary | ICD-10-CM

## 2014-01-11 ENCOUNTER — Ambulatory Visit: Payer: BC Managed Care – PPO | Admitting: Podiatry

## 2014-03-13 ENCOUNTER — Encounter: Payer: Self-pay | Admitting: Gynecology

## 2014-04-11 ENCOUNTER — Telehealth: Payer: Self-pay

## 2014-04-11 NOTE — Telephone Encounter (Signed)
lmtcb to reschedule AEX with Dr. Lathrop 

## 2014-04-26 ENCOUNTER — Encounter (INDEPENDENT_AMBULATORY_CARE_PROVIDER_SITE_OTHER): Payer: Self-pay

## 2014-04-26 ENCOUNTER — Encounter: Payer: Self-pay | Admitting: Internal Medicine

## 2014-04-26 ENCOUNTER — Ambulatory Visit (INDEPENDENT_AMBULATORY_CARE_PROVIDER_SITE_OTHER): Payer: BC Managed Care – PPO | Admitting: Internal Medicine

## 2014-04-26 VITALS — BP 132/88 | HR 96 | Ht 66.0 in | Wt 244.0 lb

## 2014-04-26 DIAGNOSIS — R059 Cough, unspecified: Secondary | ICD-10-CM

## 2014-04-26 DIAGNOSIS — R05 Cough: Secondary | ICD-10-CM

## 2014-04-26 MED ORDER — PANTOPRAZOLE SODIUM 40 MG PO TBEC: 40.0000 mg | DELAYED_RELEASE_TABLET | Freq: Every day | ORAL | Status: DC

## 2014-04-26 MED ORDER — FAMOTIDINE 20 MG PO TABS: ORAL_TABLET | ORAL | Status: DC

## 2014-04-26 MED ORDER — PREDNISONE 10 MG PO TABS: ORAL_TABLET | ORAL | Status: DC

## 2014-04-26 NOTE — Progress Notes (Signed)
Subjective:    Patient ID: Amy [name], female    DOB: Sep 28, 1950,   MRN: [id number]  HPI  23 yobf quit smoking in around 1982 s sequelae with onset sob mowing grass summer 2014 then ? bad cold winter 2015 needed an inhaler just during this event and then again 02/2014 same symptoms of "coming down with a cold" assoc nasal drainage and cough worse esp 2 am referred by Dr cloward to pulmonary clinic for eval of cough and first seen 04/26/14    04/26/2014 1st [facility] office visit/ Amy [name]   Chief Complaint  Patient presents with  . Pulmonary Consult    Referred by Dr. Thea Silversmith. Pt c/o "sinus issues".  Pt c/o SOB "because of the PND"- bothers her at night and has prod cough with large amounts of clear sputum.  She is using proair about 3 x per day.   abrupt onset with uri 02/2014 now refractory cough esp 2 am better p prednisone  And better transiently with inhaler 1 h prior to OV "large amt of mucus" = may a tbsp at most. Has also constant sensation of pnds already tried zpak otc cough meds/ decongestants and tussionex none of which really helped except the tussionex.  Really not sob unless coughing.  saba use very ineffective   No obvious other patterns in day to day or daytime variabilty or assoc cp or chest tightness, subjective wheeze overt sinus or hb symptoms. No unusual exp hx or h/o childhood pna/ asthma or knowledge of premature birth.   . Also denies any obvious fluctuation of symptoms with weather or environmental changes or other aggravating or alleviating factors except as outlined above   Current Medications, Allergies, Complete Past Medical History, Past Surgical History, Family History, and Social History were reviewed in [employer] record.             Review of Systems  Constitutional: Negative for fever, chills and unexpected weight change.  HENT: Positive for postnasal drip and sinus pressure. Negative for congestion, dental problem, ear  pain, nosebleeds, rhinorrhea, sneezing, sore throat, trouble swallowing and voice change.   Eyes: Negative for visual disturbance.  Respiratory: Positive for cough and shortness of breath. Negative for choking.   Cardiovascular: Negative for chest pain and leg swelling.  Gastrointestinal: Negative for vomiting, abdominal pain and diarrhea.  Genitourinary: Negative for difficulty urinating.  Musculoskeletal: Negative for arthralgias.  Skin: Negative for rash.  Neurological: Positive for headaches. Negative for tremors and syncope.  Hematological: Does not bruise/bleed easily.       Objective:   Physical Exam   amb bf nad top dentures mostly pseudowheeze  Wt Readings from Last 3 Encounters:  04/26/14 244 lb (110.678 kg)  08/10/13 238 lb (107.956 kg)  09/29/12 220 lb (99.791 kg)    Vital signs reviewed  HEENT: nl dentition, turbinates, and orophanx. Nl external ear canals without cough reflex   NECK :  without JVD/Nodes/TM/ nl carotid upstrokes bilaterally   LUNGS: no acc muscle use, clear to A and P bilaterally without cough on insp or exp maneuvers   CV:  RRR  no s3 or murmur or increase in P2, no edema   ABD:  soft and nontender with nl excursion in the supine position. No bruits or organomegaly, bowel sounds nl  MS:  warm without deformities, calf tenderness, cyanosis or clubbing  SKIN: warm and dry without lesions    NEURO:  alert, approp, no deficits    cxr 03/05/14  per care everywhere No significant infiltrates. Heart size normal. No adenopathy. Mild plaque in the aorta. Mild degenerative spondylosis of the spine      Assessment & Plan:

## 2014-04-26 NOTE — Patient Instructions (Addendum)
Prednisone 10 mg take  4 each am x 2 days,   2 each am x 2 days,  1 each am x 2 days and stop   Pantoprazole (protonix) 40 mg   Take 30-60 min before first meal of the day and Pepcid 20 mg one bedtime until return to office - this is the best way to tell whether stomach acid is contributing to your problem.    For drainage take chlortrimeton (chlorpheniramine) 4 mg every 4 hours available over the counter (may cause drowsiness)   GERD (REFLUX)  is an extremely common cause of respiratory symptoms just like yours , many times with no obvious heartburn at all.    It can be treated with medication, but also with lifestyle changes including avoidance of late meals, excessive alcohol, smoking cessation, and avoid fatty foods, chocolate, peppermint, colas, red wine, and acidic juices such as orange juice.  NO MINT OR MENTHOL PRODUCTS SO NO COUGH DROPS  USE SUGARLESS CANDY INSTEAD (Jolley ranchers or Stover's or Life Savers) or even ice chips will also do - the key is to swallow to prevent all throat clearing. NO OIL BASED VITAMINS - use powdered substitutes.  Only use your albuterol (proair) as a rescue medication to be used if you can't catch your breath by resting or doing a relaxed purse lip breathing pattern.  - The less you use it, the better it will work when you need it. - Ok to use up to 2 puffs  every 4 hours if you must but call for immediate appointment if use goes up over your usual need - Don't leave home without it !!  (think of it like the spare tire for your car)    Work on inhaler technique:  relax and gently blow all the way out then take a nice smooth deep breath back in, triggering the inhaler at same time you start breathing in.  Hold for up to 5 seconds if you can.  Rinse and gargle with water when done     Please schedule a follow up office visit in 4 weeks, sooner if needed with pfts and repeat cxr if still having symptoms

## 2014-04-27 DIAGNOSIS — R05 Cough: Secondary | ICD-10-CM | POA: Insufficient documentation

## 2014-04-27 DIAGNOSIS — R059 Cough, unspecified: Secondary | ICD-10-CM | POA: Insufficient documentation

## 2014-04-27 NOTE — Assessment & Plan Note (Addendum)
The most common causes of chronic cough in immunocompetent adults include the following: upper airway cough syndrome (UACS), previously referred to as postnasal drip syndrome (PNDS), which is caused by variety of rhinosinus conditions; (2) asthma; (3) GERD; (4) chronic bronchitis from cigarette smoking or other inhaled environmental irritants; (5) nonasthmatic eosinophilic bronchitis; and (6) bronchiectasis.   These conditions, singly or in combination, have accounted for up to 94% of the causes of chronic cough in prospective studies.   Other conditions have constituted no >6% of the causes in prospective studies These have included bronchogenic carcinoma, chronic interstitial pneumonia, sarcoidosis, left ventricular failure, ACEI-induced cough, and aspiration from a condition associated with pharyngeal dysfunction.    Chronic cough is often simultaneously caused by more than one condition. A single cause has been found from 38 to 82% of the time, multiple causes from 18 to 62%. Multiply caused cough has been the result of three diseases up to 42% of the time.       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   These are the same pts (now being labeled as having "irritable larynx syndrome" by some cough centers) who not infrequently have a history of having failed to tolerate ace inhibitors,  dry powder inhalers or biphosphonates or report having atypical reflux symptoms that don't respond to standard doses of PPI , and  are easily confused as having aecopd or asthma flares by even experienced allergists/ pulmonologists.   The first step is to maximize acid suppression and eliminate cyclical coughing then regroup if the cough persists.  See instructions for specific recommendations which were reviewed directly with the patient who was given a copy with highlighter outlining the key components.    The proper method of use, as well as anticipated side effects, of a metered-dose inhaler are discussed and demonstrated to the patient. Improved effectiveness after extensive coaching during this visit to a level of approximately  75% from a baseline of < 25 % effective so ok to use the saba prn up to q4 if really helps.  F/u in 4 weeks with pfts off inhalers

## 2014-05-14 ENCOUNTER — Emergency Department (HOSPITAL_COMMUNITY)
Admission: EM | Admit: 2014-05-14 | Discharge: 2014-05-14 | Disposition: A | Discharge status: Home / Self Care | Payer: BC Managed Care – PPO | Attending: Emergency Medicine | Admitting: Emergency Medicine

## 2014-05-14 ENCOUNTER — Emergency Department (HOSPITAL_COMMUNITY): Payer: BC Managed Care – PPO

## 2014-05-14 ENCOUNTER — Encounter (HOSPITAL_COMMUNITY): Payer: Self-pay | Admitting: Emergency Medicine

## 2014-05-14 DIAGNOSIS — Z79899 Other long term (current) drug therapy: Secondary | ICD-10-CM | POA: Insufficient documentation

## 2014-05-14 DIAGNOSIS — R05 Cough: Secondary | ICD-10-CM | POA: Diagnosis present

## 2014-05-14 DIAGNOSIS — Z87442 Personal history of urinary calculi: Secondary | ICD-10-CM | POA: Diagnosis not present

## 2014-05-14 DIAGNOSIS — Z87891 Personal history of nicotine dependence: Secondary | ICD-10-CM | POA: Insufficient documentation

## 2014-05-14 DIAGNOSIS — E119 Type 2 diabetes mellitus without complications: Secondary | ICD-10-CM | POA: Diagnosis not present

## 2014-05-14 DIAGNOSIS — J189 Pneumonia, unspecified organism: Secondary | ICD-10-CM

## 2014-05-14 DIAGNOSIS — Z86010 Personal history of colonic polyps: Secondary | ICD-10-CM | POA: Diagnosis not present

## 2014-05-14 DIAGNOSIS — R062 Wheezing: Secondary | ICD-10-CM

## 2014-05-14 MED ORDER — IPRATROPIUM-ALBUTEROL 0.5-2.5 (3) MG/3ML IN SOLN
3.0000 mL | Freq: Once | RESPIRATORY_TRACT | Status: AC
  Administered 2014-05-14: 3 mL via RESPIRATORY_TRACT
  Filled 2014-05-14: qty 3

## 2014-05-14 MED ORDER — PREDNISONE 20 MG PO TABS: ORAL_TABLET | ORAL | Status: DC

## 2014-05-14 MED ORDER — PREDNISONE 20 MG PO TABS
60.0000 mg | ORAL_TABLET | Freq: Once | ORAL | Status: AC
  Administered 2014-05-14: 60 mg via ORAL
  Filled 2014-05-14: qty 3

## 2014-05-14 NOTE — ED Notes (Signed)
She is awake, alert and in no distress; and is currently receiving her h.h.n. Treatment.  She has just ambulated to and from x-ray without difficulty.  Her husband is with her.

## 2014-05-14 NOTE — ED Notes (Signed)
Pt arrived to the ED with a complaint of a cough.  Pt has been seen for Upper respiratory ailments for three weeks .  Pt is taking various medications which has lead to a cough.  Pt states cough has been present for several days but became irritable in the last two days.

## 2014-05-14 NOTE — Discharge Instructions (Signed)
Pneumonitis Pneumonitis is inflammation of the lungs.  CAUSES  Many things can cause pneumonitis. These can include:   A bacterial or viral infection. Pneumonitis due to an infection is usually called pneumonia.  Work-related exposures, including farm and industrial work. Some substances that can cause pneumonitis include asbestos, silica, inhaled acids, or inhaled chlorine gas.   Repeated exposure to bird feathers, bird feces, or other allergens.   Medicine such as chemotherapy drugs, certain antibiotics, and some heart medicines.   Radiation therapy.   Exposure to mold. A hot tub, sauna, or home humidifier can have mold growing in it, even if it looks clean. The mold can be breathed in through water vapor.  Breathing (aspirating) stomach contents, food, or liquids into the lungs.  SIGNS AND SYMPTOMS   Cough.   Shortness of breath or difficulty breathing.   Fever.   Decreased energy.   Decreased appetite.  DIAGNOSIS  To diagnose pneumonitis, your health care provider will do a complete history and physical exam. Various tests may be ordered, such as:   Pulmonary function test.   Chest X-ray.   CT scan of the lungs.   Bronchoscopy.   Lung biopsy.  TREATMENT  Treatment will depend on the cause of the pneumonitis. If the cause is exposure to a substance, avoiding further exposure to that substance will help reduce your symptoms. Possible medical treatments for pneumonitis include:   Corticosteroid medicine to help decrease inflammation in the lungs.   Antibiotic medicine to help fight a bacterial lung infection.   Oxygen therapy if you are having difficulty breathing.  HOME CARE INSTRUCTIONS   Avoid exposure to any substance identified as the cause of your pneumonitis.   If you must continue to work with substances that can cause pneumonitis, wear a mask to protect your lungs.   Only take over-the-counter or prescription medicine as directed by  your health care provider.   Do not smoke.   If you use inhalers, keep them with you at all times.   Follow up with your health care provider as directed.  SEEK IMMEDIATE MEDICAL CARE IF:   You develop new or increased shortness of breath.   You develop a blue color (cyanosis) under your fingernails.   You have a fever.  MAKE SURE YOU:   Understand these instructions.  Will watch your condition.  Will get help right away if you are not doing well or get worse. Document Released: 10/16/2009 Document Revised: 12/29/2012 Document Reviewed: 10/18/2012 [facility] Patient Information 2015 [place], [place]. This information is not intended to replace advice given to you by your health care provider. Make sure you discuss any questions you have with your health care provider.
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
## 2014-05-25 ENCOUNTER — Emergency Department (HOSPITAL_COMMUNITY)
Admission: EM | Admit: 2014-05-25 | Discharge: 2014-05-25 | Disposition: A | Discharge status: Home / Self Care | Payer: BC Managed Care – PPO | Attending: Emergency Medicine | Admitting: Emergency Medicine

## 2014-05-25 ENCOUNTER — Emergency Department (HOSPITAL_COMMUNITY): Payer: BC Managed Care – PPO

## 2014-05-25 ENCOUNTER — Encounter (HOSPITAL_COMMUNITY): Payer: Self-pay | Admitting: Emergency Medicine

## 2014-05-25 DIAGNOSIS — R0602 Shortness of breath: Secondary | ICD-10-CM | POA: Diagnosis present

## 2014-05-25 DIAGNOSIS — Z7952 Long term (current) use of systemic steroids: Secondary | ICD-10-CM | POA: Diagnosis not present

## 2014-05-25 DIAGNOSIS — R0981 Nasal congestion: Secondary | ICD-10-CM

## 2014-05-25 DIAGNOSIS — J45901 Unspecified asthma with (acute) exacerbation: Secondary | ICD-10-CM | POA: Diagnosis not present

## 2014-05-25 DIAGNOSIS — Z87442 Personal history of urinary calculi: Secondary | ICD-10-CM | POA: Diagnosis not present

## 2014-05-25 DIAGNOSIS — Z86010 Personal history of colonic polyps: Secondary | ICD-10-CM | POA: Insufficient documentation

## 2014-05-25 DIAGNOSIS — Z87891 Personal history of nicotine dependence: Secondary | ICD-10-CM | POA: Diagnosis not present

## 2014-05-25 DIAGNOSIS — E119 Type 2 diabetes mellitus without complications: Secondary | ICD-10-CM | POA: Diagnosis not present

## 2014-05-25 DIAGNOSIS — J392 Other diseases of pharynx: Secondary | ICD-10-CM | POA: Diagnosis not present

## 2014-05-25 DIAGNOSIS — Z79899 Other long term (current) drug therapy: Secondary | ICD-10-CM | POA: Diagnosis not present

## 2014-05-25 MED ORDER — DEXAMETHASONE SODIUM PHOSPHATE 4 MG/ML IJ SOLN
4.0000 mg | Freq: Once | INTRAMUSCULAR | Status: AC
  Administered 2014-05-25: 4 mg via INTRAMUSCULAR
  Filled 2014-05-25: qty 1

## 2014-05-25 MED ORDER — FLUTICASONE PROPIONATE 50 MCG/ACT NA SUSP: 2.0000 | Freq: Every day | NASAL | Status: DC
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
## 2014-05-25 NOTE — Discharge Instructions (Signed)
Try flonase twice a day.   Continue albuterol as needed.   Follow up with your doctor.   Return to ER if you have trouble breathing, throat swelling.
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
## 2014-05-31 ENCOUNTER — Ambulatory Visit: Payer: BC Managed Care – PPO | Admitting: Internal Medicine

## 2014-06-27 ENCOUNTER — Ambulatory Visit: Payer: BC Managed Care – PPO | Admitting: Internal Medicine

## 2014-07-05 ENCOUNTER — Emergency Department (HOSPITAL_COMMUNITY)
Admission: EM | Admit: 2014-07-05 | Discharge: 2014-07-05 | Disposition: A | Discharge status: Home / Self Care | Payer: BC Managed Care – PPO | Attending: Emergency Medicine | Admitting: Emergency Medicine

## 2014-07-05 ENCOUNTER — Emergency Department (HOSPITAL_COMMUNITY): Payer: BC Managed Care – PPO

## 2014-07-05 ENCOUNTER — Encounter (HOSPITAL_COMMUNITY): Payer: Self-pay | Admitting: Emergency Medicine

## 2014-07-05 DIAGNOSIS — Z86010 Personal history of colonic polyps: Secondary | ICD-10-CM | POA: Insufficient documentation

## 2014-07-05 DIAGNOSIS — Z7982 Long term (current) use of aspirin: Secondary | ICD-10-CM | POA: Insufficient documentation

## 2014-07-05 DIAGNOSIS — Z79899 Other long term (current) drug therapy: Secondary | ICD-10-CM | POA: Insufficient documentation

## 2014-07-05 DIAGNOSIS — J45901 Unspecified asthma with (acute) exacerbation: Secondary | ICD-10-CM | POA: Insufficient documentation

## 2014-07-05 DIAGNOSIS — R0602 Shortness of breath: Secondary | ICD-10-CM | POA: Diagnosis present

## 2014-07-05 DIAGNOSIS — Z87891 Personal history of nicotine dependence: Secondary | ICD-10-CM | POA: Diagnosis not present

## 2014-07-05 DIAGNOSIS — Z87442 Personal history of urinary calculi: Secondary | ICD-10-CM | POA: Insufficient documentation

## 2014-07-05 DIAGNOSIS — Z7951 Long term (current) use of inhaled steroids: Secondary | ICD-10-CM | POA: Insufficient documentation

## 2014-07-05 DIAGNOSIS — E119 Type 2 diabetes mellitus without complications: Secondary | ICD-10-CM | POA: Insufficient documentation

## 2014-07-05 MED ORDER — PREDNISONE 50 MG PO TABS: 50.0000 mg | ORAL_TABLET | Freq: Every day | ORAL | Status: DC

## 2014-07-05 MED ORDER — IPRATROPIUM-ALBUTEROL 0.5-2.5 (3) MG/3ML IN SOLN
3.0000 mL | Freq: Once | RESPIRATORY_TRACT | Status: AC
  Administered 2014-07-05: 3 mL via RESPIRATORY_TRACT
  Filled 2014-07-05: qty 3

## 2014-07-05 MED ORDER — IPRATROPIUM-ALBUTEROL 0.5-2.5 (3) MG/3ML IN SOLN: 3.0000 mL | Freq: Once | RESPIRATORY_TRACT | Status: DC

## 2014-07-05 MED ORDER — ALBUTEROL (5 MG/ML) CONTINUOUS INHALATION SOLN
10.0000 mg/h | INHALATION_SOLUTION | RESPIRATORY_TRACT | Status: DC
  Administered 2014-07-05: 10 mg/h via RESPIRATORY_TRACT
  Filled 2014-07-05: qty 20

## 2014-07-05 NOTE — ED Notes (Signed)
Patient's lung sounds improved after first neb treatment, but she still has end expiratory wheezing in all lobes.

## 2014-07-05 NOTE — ED Notes (Signed)
Patient comes from home with a one week history of cold symptoms.  Patient c/o cough productive of yellow sputum.  Patient denies N/V and fever.  On exam, patient has expiratory wheezing in all lobes.  Heart sounds are WNL, S1/S2 and no murmur, gallop or rub.  +1 radial and pedal pulses palpated.  No pedal or pre-tibial edema noted.

## 2014-07-05 NOTE — Discharge Instructions (Signed)

## 2014-07-05 NOTE — ED Provider Notes (Signed)
CSN: [id number]     Arrival date & time 07/05/14  0383 History   First MD Initiated Contact with Patient 07/05/14 0801     Chief Complaint  Patient presents with  . Shortness of Breath  . Cough     (Consider location/radiation/quality/duration/timing/severity/associated sxs/prior Treatment) Patient is a 64 y.o. female presenting with shortness of breath and cough.  Shortness of Breath Severity:  Moderate Onset quality:  Gradual Duration:  1 week Timing:  Constant Progression:  Unchanged Chronicity:  Recurrent Context: URI (1 week ago)   Relieved by:  Nothing Worsened by:  Nothing tried Ineffective treatments:  Inhaler Associated symptoms: cough (yellow, productive)   Associated symptoms: no fever, no rash and no vomiting   Cough Associated symptoms: shortness of breath   Associated symptoms: no fever and no rash     Past Medical History  Diagnosis Date  . Diabetes mellitus   . Colon polyp, hyperplastic 03/29/2010  . History of kidney stones   . Asthma    Past Surgical History  Procedure Laterality Date  . Total abdominal hysterectomy  1984  . Ectopic pregnancy surgery    . Ovarian cyst removal  35 years ago  . Cyst excision Left 15 years ago    Axillary    Family History  Problem Relation Age of Onset  . Diabetes Father   . Vaginal cancer Maternal Grandmother   . Heart disease Maternal Grandfather   . Heart attack Maternal Grandfather   . Diabetes Brother   . Hypertension Mother   . Hypertension Sister   . Thyroid disease Daughter   . Allergies Daughter   . Allergies Sister    History  Substance Use Topics  . Smoking status: Former Smoker -- 0.50 packs/day for 12 years    Types: Cigarettes    Quit date: 05/12/1980  . Smokeless tobacco: Never Used  . Alcohol Use: 0.0 oz/week    0 Standard drinks or equivalent per week     Comment: ocass   OB History    Gravida Para Term Preterm AB TAB SAB Ectopic Multiple Living   3 2 2  1  1   2      Review of  Systems  Constitutional: Negative for fever.  Respiratory: Positive for cough (yellow, productive) and shortness of breath.   Gastrointestinal: Negative for vomiting.  Skin: Negative for rash.  All other systems reviewed and are negative.     Allergies  Review of patient's allergies indicates no known allergies.  Home Medications   Prior to Admission medications   Medication Sig Start Date End Date Taking? Authorizing Provider  albuterol (PROAIR HFA) 108 (90 BASE) MCG/ACT inhaler Inhale 2 puffs into the lungs every 4 (four) hours as needed for wheezing or shortness of breath.   Yes Historical Provider, MD  aspirin 81 MG chewable tablet Chew 81 mg by mouth daily.   Yes Historical Provider, MD  BAYER ASPIRIN PO Take 1 tablet by mouth daily as needed (pain).   Yes Historical Provider, MD  docusate sodium (COLACE) 100 MG capsule Take 100 mg by mouth daily.   Yes Historical Provider, MD  famotidine (PEPCID) 20 MG tablet One at bedtime Patient taking differently: Take 20 mg by mouth daily as needed for heartburn or indigestion. One at bedtime 04/26/14  Yes Tanda Rockers, MD  fluticasone [facility]) 50 MCG/ACT nasal spray Place 2 sprays into both nostrils daily. 05/25/14  Yes Wandra Arthurs, MD  guaiFENesin (MUCINEX) 600 MG 12 hr tablet  Take 600 mg by mouth 2 (two) times daily.   Yes Historical Provider, MD  GuaiFENesin (ROBITUSSIN CHEST CONGESTION PO) Take 10 mLs by mouth every 6 (six) hours as needed (cough). As directed when needed   Yes Historical Provider, MD  HYDROcodone-acetaminophen (NORCO/VICODIN) 5-325 MG per tablet Take 1 tablet by mouth every 6 (six) hours as needed for moderate pain.   Yes Historical Provider, MD  metFORMIN (GLUCOPHAGE-XR) 500 MG 24 hr tablet Take 500 mg by mouth daily with breakfast.   Yes Historical Provider, MD  pantoprazole (PROTONIX) 40 MG tablet Take 1 tablet (40 mg total) by mouth daily. Take 30-60 min before first meal of the day Patient not taking: Reported on  07/05/2014 04/26/14   Tanda Rockers, MD  predniSONE (DELTASONE) 50 MG tablet Take 1 tablet (50 mg total) by mouth daily. 07/05/14   Debby Freiberg, MD   BP 132/77 mmHg  Pulse 90  Temp(Src) 98.7 F (37.1 C) (Oral)  Resp 17  SpO2 96%  LMP 05/12/1982 Physical Exam  Constitutional: She is oriented to person, place, and time. She appears well-developed and well-nourished.  HENT:  Head: Normocephalic and atraumatic.  Right Ear: External ear normal.  Left Ear: External ear normal.  Eyes: Conjunctivae and EOM are normal. Pupils are equal, round, and reactive to light.  Neck: Normal range of motion. Neck supple.  Cardiovascular: Normal rate, regular rhythm, normal heart sounds and intact distal pulses.   Pulmonary/Chest: Effort normal. She has wheezes (diffuse).  Abdominal: Soft. Bowel sounds are normal. There is no tenderness.  Musculoskeletal: Normal range of motion.  Neurological: She is alert and oriented to person, place, and time.  Skin: Skin is warm and dry.  Vitals reviewed.   ED Course  Procedures (including critical care time) Labs Review Labs Reviewed - No data to display  Imaging Review Dg Chest 2 View (if Patient Has Fever And/or Copd)  07/05/2014   CLINICAL DATA:  One week history of difficulty breathing with cough and congestion  EXAM: CHEST  2 VIEW  COMPARISON:  May 14, 2014  FINDINGS: Lungs are clear. Heart size and pulmonary vascularity are normal. No adenopathy. There is mild degenerative change in the thoracic spine.  IMPRESSION: No edema or consolidation.   Electronically Signed   By: Lowella Grip III M.D.   On: 07/05/2014 08:19     EKG Interpretation   Date/Time:  Wednesday July 05 2014 08:43:54 EST Ventricular Rate:  98 PR Interval:  197 QRS Duration: 99 QT Interval:  387 QTC Calculation: 494 R Axis:   72 Text Interpretation:  Sinus rhythm Borderline prolonged QT interval No  significant change since last tracing Confirmed by Debby Freiberg  [phone])  on 07/05/2014 8:49:26 AM      MDM   Final diagnoses:  Asthma attack    64 y.o. female with pertinent PMH of asthma presents with recrrent dyspnea in setting of antecedent URI.  No fevers and pt is systemically well otherwise.  On arrival vital signs and physical exam as above, diffuse wheezing appreciated. Albuterol one hour continuous neb administered, with great improvement. This was followed by a duoneb.  Pt felt better and requested to leave.  Likely asthma attack.  DC home in stable condition with steroids.  I have reviewed all laboratory and imaging studies if ordered as above  1. Asthma attack         Debby Freiberg, MD 07/05/14 1444

## 2014-07-05 NOTE — ED Notes (Addendum)
Pt from home c/o shortness of breath and cough x 1 week with yellow exporectorant. Was seen at PCP yesterday but reports she is worse today. Inspiratory and expiratory wheeze throughout. Albuterol inhaler used with out relief. Appointment with pulmonologist in March.

## 2014-07-19 ENCOUNTER — Ambulatory Visit: Payer: BC Managed Care – PPO | Admitting: Internal Medicine

## 2014-08-02 ENCOUNTER — Other Ambulatory Visit: Payer: Self-pay | Admitting: Internal Medicine

## 2014-08-02 DIAGNOSIS — R059 Cough, unspecified: Secondary | ICD-10-CM

## 2014-08-02 DIAGNOSIS — R05 Cough: Secondary | ICD-10-CM

## 2014-08-15 ENCOUNTER — Ambulatory Visit: Payer: BC Managed Care – PPO | Admitting: Gynecology

## 2014-08-21 ENCOUNTER — Telehealth: Payer: Self-pay | Admitting: Internal Medicine

## 2014-08-21 MED ORDER — FAMOTIDINE 20 MG PO TABS: ORAL_TABLET | ORAL | Status: DC

## 2014-08-21 MED ORDER — PANTOPRAZOLE SODIUM 40 MG PO TBEC: 40.0000 mg | DELAYED_RELEASE_TABLET | Freq: Every day | ORAL | Status: DC

## 2014-08-21 NOTE — Telephone Encounter (Signed)
Rx's have been sent in. Pt has pending appointment with MW on 08/30/14. Pt is aware. Nothing further was needed.

## 2014-08-26 ENCOUNTER — Emergency Department (HOSPITAL_COMMUNITY): Payer: BC Managed Care – PPO

## 2014-08-26 ENCOUNTER — Encounter (HOSPITAL_COMMUNITY): Payer: Self-pay | Admitting: Emergency Medicine

## 2014-08-26 ENCOUNTER — Emergency Department (HOSPITAL_COMMUNITY)
Admission: EM | Admit: 2014-08-26 | Discharge: 2014-08-26 | Disposition: A | Discharge status: Home / Self Care | Payer: BC Managed Care – PPO | Attending: Emergency Medicine | Admitting: Emergency Medicine

## 2014-08-26 DIAGNOSIS — Z86010 Personal history of colonic polyps: Secondary | ICD-10-CM | POA: Diagnosis not present

## 2014-08-26 DIAGNOSIS — Z7982 Long term (current) use of aspirin: Secondary | ICD-10-CM | POA: Diagnosis not present

## 2014-08-26 DIAGNOSIS — Z7951 Long term (current) use of inhaled steroids: Secondary | ICD-10-CM | POA: Diagnosis not present

## 2014-08-26 DIAGNOSIS — J45901 Unspecified asthma with (acute) exacerbation: Secondary | ICD-10-CM | POA: Diagnosis not present

## 2014-08-26 DIAGNOSIS — Z87891 Personal history of nicotine dependence: Secondary | ICD-10-CM | POA: Insufficient documentation

## 2014-08-26 DIAGNOSIS — E119 Type 2 diabetes mellitus without complications: Secondary | ICD-10-CM | POA: Insufficient documentation

## 2014-08-26 DIAGNOSIS — R0602 Shortness of breath: Secondary | ICD-10-CM | POA: Diagnosis present

## 2014-08-26 DIAGNOSIS — Z87442 Personal history of urinary calculi: Secondary | ICD-10-CM | POA: Diagnosis not present

## 2014-08-26 MED ORDER — IPRATROPIUM BROMIDE 0.02 % IN SOLN
0.5000 mg | Freq: Once | RESPIRATORY_TRACT | Status: AC
  Administered 2014-08-26: 0.5 mg via RESPIRATORY_TRACT
  Filled 2014-08-26: qty 2.5

## 2014-08-26 MED ORDER — ALBUTEROL (5 MG/ML) CONTINUOUS INHALATION SOLN
15.0000 mg/h | INHALATION_SOLUTION | Freq: Once | RESPIRATORY_TRACT | Status: AC
  Administered 2014-08-26: 15 mg/h via RESPIRATORY_TRACT
  Filled 2014-08-26: qty 20

## 2014-08-26 MED ORDER — IPRATROPIUM-ALBUTEROL 0.5-2.5 (3) MG/3ML IN SOLN
3.0000 mL | Freq: Once | RESPIRATORY_TRACT | Status: AC
  Administered 2014-08-26: 3 mL via RESPIRATORY_TRACT
  Filled 2014-08-26: qty 3

## 2014-08-26 MED ORDER — PREDNISONE 50 MG PO TABS: ORAL_TABLET | ORAL | Status: DC

## 2014-08-26 MED ORDER — ALBUTEROL SULFATE HFA 108 (90 BASE) MCG/ACT IN AERS: 1.0000 | INHALATION_SPRAY | Freq: Four times a day (QID) | RESPIRATORY_TRACT | Status: DC | PRN

## 2014-08-26 MED ORDER — PREDNISONE 20 MG PO TABS
60.0000 mg | ORAL_TABLET | Freq: Once | ORAL | Status: AC
  Administered 2014-08-26: 60 mg via ORAL
  Filled 2014-08-26: qty 3

## 2014-08-26 NOTE — Discharge Instructions (Signed)
Asthma Take the steroids as prescribed. Monitor your blood sugars closely. Follow up with Dr. Melvyn Novas as scheduled. Return to the ED if you develop new or worsening symptoms. Asthma is a recurring condition in which the airways tighten and narrow. Asthma can make it difficult to breathe. It can cause coughing, wheezing, and shortness of breath. Asthma episodes, also called asthma attacks, range from minor to life-threatening. Asthma cannot be cured, but medicines and lifestyle changes can help control it. CAUSES Asthma is believed to be caused by inherited (genetic) and environmental factors, but its exact cause is unknown. Asthma may be triggered by allergens, lung infections, or irritants in the air. Asthma triggers are different for each person. Common triggers include:   Animal dander.  Dust mites.  Cockroaches.  Pollen from trees or grass.  Mold.  Smoke.  Air pollutants such as dust, household cleaners, hair sprays, aerosol sprays, paint fumes, strong chemicals, or strong odors.  Cold air, weather changes, and winds (which increase molds and pollens in the air).  Strong emotional expressions such as crying or laughing hard.  Stress.  Certain medicines (such as aspirin) or types of drugs (such as beta-blockers).  Sulfites in foods and drinks. Foods and drinks that may contain sulfites include dried fruit, potato chips, and sparkling grape juice.  Infections or inflammatory conditions such as the flu, a cold, or an inflammation of the nasal membranes (rhinitis).  Gastroesophageal reflux disease (GERD).  Exercise or strenuous activity. SYMPTOMS Symptoms may occur immediately after asthma is triggered or many hours later. Symptoms include:  Wheezing.  Excessive nighttime or early morning coughing.  Frequent or severe coughing with a common cold.  Chest tightness.  Shortness of breath. DIAGNOSIS  The diagnosis of asthma is made by a review of your medical history and a  physical exam. Tests may also be performed. These may include:  Lung function studies. These tests show how much air you breathe in and out.  Allergy tests.  Imaging tests such as X-rays. TREATMENT  Asthma cannot be cured, but it can usually be controlled. Treatment involves identifying and avoiding your asthma triggers. It also involves medicines. There are 2 classes of medicine used for asthma treatment:   Controller medicines. These prevent asthma symptoms from occurring. They are usually taken every day.  Reliever or rescue medicines. These quickly relieve asthma symptoms. They are used as needed and provide short-term relief. Your health care provider will help you create an asthma action plan. An asthma action plan is a written plan for managing and treating your asthma attacks. It includes a list of your asthma triggers and how they may be avoided. It also includes information on when medicines should be taken and when their dosage should be changed. An action plan may also involve the use of a device called a peak flow meter. A peak flow meter measures how well the lungs are working. It helps you monitor your condition. HOME CARE INSTRUCTIONS   Take medicines only as directed by your health care provider. Speak with your health care provider if you have questions about how or when to take the medicines.  Use a peak flow meter as directed by your health care provider. Record and keep track of readings.  Understand and use the action plan to help minimize or stop an asthma attack without needing to seek medical care.  Control your home environment in the following ways to help prevent asthma attacks:  Do not smoke. Avoid being exposed to secondhand  smoke.  Change your heating and air conditioning filter regularly.  Limit your use of fireplaces and wood stoves.  Get rid of pests (such as roaches and mice) and their droppings.  Throw away plants if you see mold on them.  Clean  your floors and dust regularly. Use unscented cleaning products.  Try to have someone else vacuum for you regularly. Stay out of rooms while they are being vacuumed and for a short while afterward. If you vacuum, use a dust mask from a hardware store, a double-layered or microfilter vacuum cleaner bag, or a vacuum cleaner with a HEPA filter.  Replace carpet with wood, tile, or vinyl flooring. Carpet can trap dander and dust.  Use allergy-proof pillows, mattress covers, and box spring covers.  Wash bed sheets and blankets every week in hot water and dry them in a dryer.  Use blankets that are made of polyester or cotton.  Clean bathrooms and kitchens with bleach. If possible, have someone repaint the walls in these rooms with mold-resistant paint. Keep out of the rooms that are being cleaned and painted.  Wash hands frequently. SEEK MEDICAL CARE IF:   You have wheezing, shortness of breath, or a cough even if taking medicine to prevent attacks.  The colored mucus you cough up (sputum) is thicker than usual.  Your sputum changes from clear or white to yellow, green, gray, or bloody.  You have any problems that may be related to the medicines you are taking (such as a rash, itching, swelling, or trouble breathing).  You are using a reliever medicine more than 2-3 times per week.  Your peak flow is still at 50-79% of your personal best after following your action plan for 1 hour.  You have a fever. SEEK IMMEDIATE MEDICAL CARE IF:   You seem to be getting worse and are unresponsive to treatment during an asthma attack.  You are short of breath even at rest.  You get short of breath when doing very little physical activity.  You have difficulty eating, drinking, or talking due to asthma symptoms.  You develop chest pain.  You develop a fast heartbeat.  You have a bluish color to your lips or fingernails.  You are light-headed, dizzy, or faint.  Your peak flow is less than  50% of your personal best. MAKE SURE YOU:   Understand these instructions.  Will watch your condition.  Will get help right away if you are not doing well or get worse. Document Released: 04/28/2005 Document Revised: 09/12/2013 Document Reviewed: 11/25/2012 [facility] Patient Information 2015 [place], [place]. This information is not intended to replace advice given to you by your health care provider. Make sure you discuss any questions you have with your health care provider.

## 2014-08-26 NOTE — ED Notes (Signed)
Pt did well while ambulating oxygen  97%

## 2014-08-26 NOTE — ED Notes (Addendum)
Pt from home c/o shortness of breath. Hx of asthma. Expiratory wheezes present. Productive cough with white expectorant. Taking mucinex without relief. She reports having an appointment with a pulmonologist on tuesday

## 2014-08-26 NOTE — ED Notes (Signed)
Rt called for neb tx

## 2014-08-26 NOTE — ED Notes (Signed)
Pt ambulated to BR and back w/o assistance or difficulty

## 2014-08-26 NOTE — ED Provider Notes (Signed)
CSN: [id number]     Arrival date & time 08/26/14  0711 History   First MD Initiated Contact with Patient 08/26/14 [phone]     Chief Complaint  Patient presents with  . Shortness of Breath  . Asthma     (Consider location/radiation/quality/duration/timing/severity/associated sxs/prior Treatment) HPI Comments: Patient presents with 2 day history of shortness of breath, cough, congestion and thinks her asthma is acting up. Her color was red and white sputum. She did Mucinex without relief. She is to inhaler multiple times last night but can't tell me how many. She feels short of breath and has been wheezing. Denies fever, chest pain, abdominal pain, nausea or vomiting. No leg pain or leg swelling. No previous hospitalizations for asthma. She does not smoke. She saw her lung specialist last week and told that she had issues with acid reflux and started on Protonix and Pepcid.  Patient is a 64 y.o. female presenting with asthma. The history is provided by the patient.  Asthma Associated symptoms include shortness of breath. Pertinent negatives include no chest pain and no abdominal pain.    Past Medical History  Diagnosis Date  . Diabetes mellitus   . Colon polyp, hyperplastic 03/29/2010  . History of kidney stones   . Asthma    Past Surgical History  Procedure Laterality Date  . Total abdominal hysterectomy  1984  . Ectopic pregnancy surgery    . Ovarian cyst removal  35 years ago  . Cyst excision Left 15 years ago    Axillary    Family History  Problem Relation Age of Onset  . Diabetes Father   . Vaginal cancer Maternal Grandmother   . Heart disease Maternal Grandfather   . Heart attack Maternal Grandfather   . Diabetes Brother   . Hypertension Mother   . Hypertension Sister   . Thyroid disease Daughter   . Allergies Daughter   . Allergies Sister    History  Substance Use Topics  . Smoking status: Former Smoker -- 0.50 packs/day for 12 years    Types: Cigarettes    Quit  date: 05/12/1980  . Smokeless tobacco: Never Used  . Alcohol Use: 0.0 oz/week    0 Standard drinks or equivalent per week     Comment: ocass   OB History    Gravida Para Term Preterm AB TAB SAB Ectopic Multiple Living   3 2 2  1  1   2      Review of Systems  Constitutional: Negative for fever, activity change and appetite change.  HENT: Positive for congestion.   Respiratory: Positive for cough, shortness of breath and wheezing. Negative for chest tightness.   Cardiovascular: Negative for chest pain.  Gastrointestinal: Negative for nausea, vomiting and abdominal pain.  Genitourinary: Negative for dysuria, hematuria, vaginal bleeding and vaginal discharge.  Musculoskeletal: Negative for myalgias and arthralgias.  Skin: Negative for rash.  A complete 10 system review of systems was obtained and all systems are negative except as noted in the HPI and PMH.      Allergies  Review of patient's allergies indicates no known allergies.  Home Medications   Prior to Admission medications   Medication Sig Start Date End Date Taking? Authorizing Provider  aspirin 81 MG chewable tablet Chew 81 mg by mouth daily.   Yes Historical Provider, MD  docusate sodium (COLACE) 100 MG capsule Take 100 mg by mouth daily.   Yes Historical Provider, MD  famotidine (PEPCID) 20 MG tablet One at bedtime 08/21/14  Yes Tanda Rockers, MD  fluticasone (FLONASE) 50 MCG/ACT nasal spray Place 2 sprays into both nostrils daily. 05/25/14  Yes Wandra Arthurs, MD  guaiFENesin (MUCINEX) 600 MG 12 hr tablet Take 1,200 mg by mouth 2 (two) times daily.    Yes Historical Provider, MD  GuaiFENesin (ROBITUSSIN CHEST CONGESTION PO) Take 10 mLs by mouth every 6 (six) hours as needed (cough). As directed when needed   Yes Historical Provider, MD  HYDROcodone-acetaminophen (NORCO/VICODIN) 5-325 MG per tablet Take 1 tablet by mouth every 6 (six) hours as needed for moderate pain.   Yes Historical Provider, MD  metFORMIN  (GLUCOPHAGE-XR) 500 MG 24 hr tablet Take 500 mg by mouth daily with breakfast.   Yes Historical Provider, MD  pantoprazole (PROTONIX) 40 MG tablet Take 1 tablet (40 mg total) by mouth daily. Take 30-60 min before first meal of the day 08/21/14  Yes Tanda Rockers, MD  albuterol (PROVENTIL HFA;VENTOLIN HFA) 108 (90 BASE) MCG/ACT inhaler Inhale 1-2 puffs into the lungs every 6 (six) hours as needed for wheezing or shortness of breath. 08/26/14   Ezequiel Essex, MD  predniSONE (DELTASONE) 50 MG tablet 1 tablet PO daily 08/26/14   Ezequiel Essex, MD   BP 130/78 mmHg  Pulse 86  Temp(Src) 98.4 F (36.9 C) (Oral)  Resp 22  Wt 230 lb (104.327 kg)  SpO2 97%  LMP 05/12/1982 Physical Exam  Constitutional: She is oriented to person, place, and time. She appears well-developed and well-nourished. No distress.  Speaking in full sentences, no distress  HENT:  Head: Normocephalic and atraumatic.  Mouth/Throat: Oropharynx is clear and moist.  Eyes: Conjunctivae and EOM are normal. Pupils are equal, round, and reactive to light.  Neck: Normal range of motion. Neck supple.  Cardiovascular: Normal rate, regular rhythm and normal heart sounds.   No murmur heard. Pulmonary/Chest: Effort normal. No respiratory distress. She has wheezes.  Diffuse wheezing throughout, inspiratory and expiratory.  Abdominal: Soft. There is no tenderness. There is no rebound and no guarding.  Musculoskeletal: Normal range of motion. She exhibits no edema or tenderness.  Neurological: She is alert and oriented to person, place, and time. No cranial nerve deficit. She exhibits normal muscle tone. Coordination normal.  Skin: Skin is warm. No rash noted.  Nursing note and vitals reviewed.   ED Course  Procedures (including critical care time) Labs Review Labs Reviewed - No data to display  Imaging Review Dg Chest 2 View  08/26/2014   CLINICAL DATA:  Productive cough, shortness of breath and wheezing for several weeks,  worsened over the last 2 days. History of asthma.  EXAM: CHEST  2 VIEW  COMPARISON:  07/05/2014  FINDINGS: The heart size and mediastinal contours are within normal limits. Both lungs are clear. No pleural effusion or pneumothorax. The visualized skeletal structures are unremarkable.  IMPRESSION: No active cardiopulmonary disease.   Electronically Signed   By: Lajean Manes M.D.   On: 08/26/2014 08:29     EKG Interpretation   Date/Time:  Saturday August 26 2014 07:41:49 EDT Ventricular Rate:  81 PR Interval:  194 QRS Duration: 103 QT Interval:  432 QTC Calculation: 501 R Axis:   52 Text Interpretation:  Sinus rhythm Prolonged QT interval Baseline wander  in lead(s) II III aVF No significant change was found Confirmed by Wyvonnia Dusky   MD, Evalise Abruzzese [phone]) on 08/26/2014 7:47:21 AM      MDM   Final diagnoses:  Asthma exacerbation   Wheezing, cough, congestion since yesterday.  No distress, no hypoxia, diffuse wheezing on exam. We'll give nebulizers, steroids Chest x-ray and reassess.  Patient improved after nebulizer. Chest x-ray shows no infiltrate. Wheezing has improved and she is in no distress. Lungs clear on recheck.  She is ambulatory maintaining saturations at 97%.  Patient will be given a course of steroids and refill of her inhaler. She is cautioned to monitor her blood sugars closely while on the prednisone. She has follow-up with her pulmonologist in 3 days. Return precautions discussed.  Ezequiel Essex, MD 08/26/14 [phone]

## 2014-08-30 ENCOUNTER — Encounter: Payer: Self-pay | Admitting: Internal Medicine

## 2014-08-30 ENCOUNTER — Ambulatory Visit (INDEPENDENT_AMBULATORY_CARE_PROVIDER_SITE_OTHER): Payer: BC Managed Care – PPO | Admitting: Internal Medicine

## 2014-08-30 VITALS — BP 120/84 | HR 76 | Ht 68.75 in | Wt 237.0 lb

## 2014-08-30 DIAGNOSIS — R05 Cough: Secondary | ICD-10-CM

## 2014-08-30 DIAGNOSIS — R059 Cough, unspecified: Secondary | ICD-10-CM

## 2014-08-30 LAB — PULMONARY FUNCTION TEST
DL/VA % pred: 120 %
DL/VA: 6.4 ml/min/mmHg/L
DLCO unc % pred: 108 %
DLCO unc: 33.25 ml/min/mmHg
FEF 25-75 Post: 3.81 L/sec
FEF 25-75 Pre: 3.37 L/sec
FEF2575-%Change-Post: 12 %
FEF2575-%Pred-Post: 162 %
FEF2575-%Pred-Pre: 143 %
FEV1-%Change-Post: 5 %
FEV1-%Pred-Post: 114 %
FEV1-%Pred-Pre: 108 %
FEV1-Post: 2.85 L
FEV1-Pre: 2.69 L
FEV1FVC-%Change-Post: 2 %
FEV1FVC-%Pred-Pre: 105 %
FEV6-%Change-Post: 3 %
FEV6-%Pred-Post: 108 %
FEV6-%Pred-Pre: 105 %
FEV6-Post: 3.32 L
FEV6-Pre: 3.22 L
FEV6FVC-%Pred-Post: 103 %
FEV6FVC-%Pred-Pre: 103 %
FVC-%Change-Post: 3 %
FVC-%Pred-Post: 104 %
FVC-%Pred-Pre: 101 %
FVC-Post: 3.32 L
FVC-Pre: 3.22 L
Post FEV1/FVC ratio: 86 %
Post FEV6/FVC ratio: 100 %
Pre FEV1/FVC ratio: 83 %
Pre FEV6/FVC Ratio: 100 %
RV % pred: 101 %
RV: 2.3 L
TLC % pred: 98 %
TLC: 5.68 L

## 2014-08-30 NOTE — Progress Notes (Signed)
PFT done today. 

## 2014-08-30 NOTE — Assessment & Plan Note (Addendum)
The most common causes of chronic cough in immunocompetent adults include the following: upper airway cough syndrome (UACS), previously referred to as postnasal drip syndrome (PNDS), which is caused by variety of rhinosinus conditions; (2) asthma; (3) GERD; (4) chronic bronchitis from cigarette smoking or other inhaled environmental irritants; (5) nonasthmatic eosinophilic bronchitis; and (6) bronchiectasis.   These conditions, singly or in combination, have accounted for up to 94% of the causes of chronic cough in prospective studies.   Other conditions have constituted no >6% of the causes in prospective studies These have included bronchogenic carcinoma, chronic interstitial pneumonia, sarcoidosis, left ventricular failure, ACEI-induced cough, and aspiration from a condition associated with pharyngeal dysfunction.    Chronic cough is often simultaneously caused by more than one condition. A single cause has been found from 38 to 82% of the time, multiple causes from 18 to 62%. Multiply caused cough has been the result of three diseases up to 42% of the time.       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   These are the same pts (now being labeled as having "irritable larynx syndrome" by some cough centers) who not infrequently have a history of having failed to tolerate ace inhibitors,  dry powder inhalers or biphosphonates or report having atypical reflux symptoms that don't respond to standard  doses of PPI , and are easily confused as having aecopd or asthma flares by even experienced allergists/ pulmonologists.   The first step is to maximize acid suppression and check sinus ct/ use h1 at hs and prn dayime then return for allergy profile/ perhaps methacholine testing if dx remains in doubt.  See instructions for specific recommendations which were reviewed directly with the patient who was given a copy with highlighter outlining the key components.

## 2014-08-30 NOTE — Patient Instructions (Addendum)
You appear to have a throat problem not a lung problem that is assoc with sensation of too much throat drainage   First take pantoprazole (protonix) 40 mg   Take 30-60 min before first meal of the day and Pepcid ac 20 mg one bedtime /Plus chorpheriamine 4 mg x 2  until return to office -     For drainage sensation daytime also ok to t ake chlortrimeton (chlorpheniramine) 4 mg every 4 hours available over the counter (may cause drowsiness)   GERD (REFLUX)  is an extremely common cause of respiratory symptoms just like yours , many times with no obvious heartburn at all.    It can be treated with medication, but also with lifestyle changes including avoidance of late meals, excessive alcohol, smoking cessation, and avoid fatty foods, chocolate, peppermint, colas, red wine, and acidic juices such as orange juice.  NO MINT OR MENTHOL PRODUCTS SO NO COUGH DROPS  USE SUGARLESS CANDY INSTEAD (Jolley ranchers or Stover's or Life Savers) or even ice chips will also do - the key is to swallow to prevent all throat clearing. NO OIL BASED VITAMINS - use powdered substitutes.  Please see patient coordinator before you leave today  to schedule sinus CT - we will postpone the allergy and swallowing evaluation until we first verify that you are off prednisone for at least a week and have an accurate inventory of exactly what you are taking   See Tammy NP w/in 2 weeks with all your medications, even over the counter meds, separated in two separate bags, the ones you take no matter what vs the ones you stop once you feel better and take only as needed when you feel you need them.   Tammy  will generate for you a new user friendly medication calendar that will put [place] all on the same page re: your medication use.     Without this process, it simply isn't possible to assure that we are providing  your outpatient care  with  the attention to detail we feel you deserve.   If we cannot assure that you're getting that kind  of care,  then we cannot manage your problem effectively from this clinic.  Once you have seen Tammy and we are sure that we're all on the same page with your medication use she will arrange follow up with me.  Needs allergy profile on return

## 2014-08-30 NOTE — Progress Notes (Signed)
Subjective:    Patient ID: Amy [name], female    DOB: 1951/02/21,   MRN: [id number]    History of Present Illness  6 yobf quit smoking in around 1982 s sequelae with onset sob mowing grass summer 2014 then ? bad cold winter 2015 needed an inhaler just during this event and then again 02/2014 same symptoms of "coming down with a cold" assoc nasal drainage and cough worse esp 2 am referred by Dr cloward to pulmonary clinic for eval of cough and first seen 04/26/14 with pfts perfectly normal 4 days p ER eval for refractory wheeze.     History of Present Illness  04/26/2014 1st [facility] office visit/ Makayela Secrest   Chief Complaint  Patient presents with  . Pulmonary Consult    Referred by Dr. Thea Silversmith. Pt c/o "sinus issues".  Pt c/o SOB "because of the PND"- bothers her at night and has prod cough with large amounts of clear sputum.  She is using proair about 3 x per day.   abrupt onset with uri 02/2014 now refractory cough esp 2 am better p prednisone  And better transiently with inhaler 1 h prior to OV "large amt of mucus" = may a tbsp at most. Has also constant sensation of pnds already tried zpak otc cough meds/ decongestants and tussionex none of which really helped except the tussionex.  Really not sob unless coughing.  saba use very ineffective  rec Prednisone 10 mg take  4 each am x 2 days,   2 each am x 2 days,  1 each am x 2 days and stop  Pantoprazole (protonix) 40 mg   Take 30-60 min before first meal of the day and Pepcid 20 mg one bedtime until return to office - this is the best way to tell whether stomach acid is contributing to your problem.   For drainage take chlortrimeton (chlorpheniramine) 4 mg every 4 hours available over the counter (may cause drowsiness)  GERD  Only use your albuterol (proair)   Work on inhaler technique Did not return    08/30/2014 f/u ov/Jaycelynn Knickerbocker re: chronic cough/ sensation of pnds with "wheezing" since 02/2014  Chief Complaint  Patient presents  with  . Follow-up    PFT done. Pt states that her cough has improved since the last visit. She c/o PND and her cough is occ prod with minimal brown sputum. She is using albuterol inhaler once per day on average.   drainage worse at hs and early in am also aware of it but it doesn't typically wake her up, is better on prednisone Very unsure of what else she's taking  Usually not sob unless coughing   No obvious day to day or daytime variabilty or assoc   cp or chest tightness,  overt  hb symptoms. No unusual exp hx or h/o childhood pna/ asthma or knowledge of premature birth.  Sleeping ok without nocturnal  or early am exacerbation  of respiratory  c/o's or need for noct saba. Also denies any obvious fluctuation of symptoms with weather or environmental changes or other aggravating or alleviating factors except as outlined above   Current Medications, Allergies, Complete Past Medical History, Past Surgical History, Family History, and Social History were reviewed in [employer] record.  ROS  The following are not active complaints unless bolded sore throat, dysphagia, dental problems, itching, sneezing,  nasal congestion or excess mostly mucoid secretions, ear ache,   fever, chills, sweats, unintended wt loss, pleuritic or  exertional cp, hemoptysis,  orthopnea pnd or leg swelling, presyncope, palpitations, heartburn, abdominal pain, anorexia, nausea, vomiting, diarrhea  or change in bowel or urinary habits, change in stools or urine, dysuria,hematuria,  rash, arthralgias, visual complaints, headache, numbness weakness or ataxia or problems with walking or coordination,  change in mood/affect or memory.             Objective:   Physical Exam   amb bf nad top dentures mostly pseudowheeze  08/30/2014       237  Wt Readings from Last 3 Encounters:  04/26/14 244 lb (110.678 kg)  08/10/13 238 lb (107.956 kg)  09/29/12 220 lb (99.791 kg)    Vital signs  reviewed  HEENT: nl dentition, turbinates, and orophanx. Nl external ear canals without cough reflex   NECK :  without JVD/Nodes/TM/ nl carotid upstrokes bilaterally   LUNGS: no acc muscle use, clear to A and P bilaterally without cough on insp or exp maneuvers   CV:  RRR  no s3 or murmur or increase in P2, no edema   ABD:  soft and nontender with nl excursion in the supine position. No bruits or organomegaly, bowel sounds nl  MS:  warm without deformities, calf tenderness, cyanosis or clubbing  SKIN: warm and dry without lesions    NEURO:  alert, approp, no deficits    I personally reviewed images and agree with radiology impression as follows:  CXR:  08/26/14  No active cardiopulmonary disease     Assessment & Plan:

## 2014-09-01 ENCOUNTER — Ambulatory Visit (INDEPENDENT_AMBULATORY_CARE_PROVIDER_SITE_OTHER)
Admission: RE | Admit: 2014-09-01 | Discharge: 2014-09-01 | Disposition: A | Discharge status: Home / Self Care | Payer: BC Managed Care – PPO | Source: Ambulatory Visit | Attending: Internal Medicine | Admitting: Internal Medicine

## 2014-09-01 DIAGNOSIS — R05 Cough: Secondary | ICD-10-CM

## 2014-09-01 DIAGNOSIS — R059 Cough, unspecified: Secondary | ICD-10-CM

## 2014-09-04 NOTE — Progress Notes (Signed)
Quick Note:  Spoke with pt and notified of results per Dr. Wert. Pt verbalized understanding and denied any questions.  ______ 

## 2014-09-14 ENCOUNTER — Encounter: Payer: BC Managed Care – PPO | Admitting: Adult Health

## 2014-09-28 ENCOUNTER — Encounter: Payer: BC Managed Care – PPO | Admitting: Adult Health

## 2014-12-18 ENCOUNTER — Other Ambulatory Visit: Payer: Self-pay

## 2014-12-18 DIAGNOSIS — Z1231 Encounter for screening mammogram for malignant neoplasm of breast: Secondary | ICD-10-CM

## 2015-01-03 ENCOUNTER — Ambulatory Visit: Payer: BC Managed Care – PPO

## 2015-03-01 ENCOUNTER — Ambulatory Visit: Payer: BC Managed Care – PPO | Admitting: Certified Nurse Midwife

## 2015-03-01 ENCOUNTER — Telehealth: Payer: Self-pay | Admitting: Certified Nurse Midwife

## 2015-03-01 NOTE — Telephone Encounter (Signed)
Patient got her appointment mixed up [facility]. She will call back to reschedule.

## 2015-03-01 NOTE — Telephone Encounter (Signed)
Patient missed her aex today due to having another appointment at the same time. Will call back to reschedule.  Routing to provider as Juluis Rainier. Will close encounter.

## 2015-03-28 ENCOUNTER — Ambulatory Visit
Admission: RE | Admit: 2015-03-28 | Discharge: 2015-03-28 | Disposition: A | Discharge status: Home / Self Care | Payer: BC Managed Care – PPO | Source: Ambulatory Visit

## 2015-03-28 DIAGNOSIS — Z1231 Encounter for screening mammogram for malignant neoplasm of breast: Secondary | ICD-10-CM

## 2015-06-16 ENCOUNTER — Emergency Department (HOSPITAL_COMMUNITY)
Admission: EM | Admit: 2015-06-16 | Discharge: 2015-06-16 | Disposition: A | Discharge status: Home / Self Care | Payer: BC Managed Care – PPO | Attending: Emergency Medicine | Admitting: Emergency Medicine

## 2015-06-16 ENCOUNTER — Emergency Department (HOSPITAL_COMMUNITY): Payer: BC Managed Care – PPO

## 2015-06-16 ENCOUNTER — Encounter (HOSPITAL_COMMUNITY): Payer: Self-pay | Admitting: Emergency Medicine

## 2015-06-16 DIAGNOSIS — E119 Type 2 diabetes mellitus without complications: Secondary | ICD-10-CM | POA: Diagnosis not present

## 2015-06-16 DIAGNOSIS — Z87442 Personal history of urinary calculi: Secondary | ICD-10-CM | POA: Diagnosis not present

## 2015-06-16 DIAGNOSIS — Z7982 Long term (current) use of aspirin: Secondary | ICD-10-CM | POA: Insufficient documentation

## 2015-06-16 DIAGNOSIS — Z86010 Personal history of colonic polyps: Secondary | ICD-10-CM | POA: Diagnosis not present

## 2015-06-16 DIAGNOSIS — I493 Ventricular premature depolarization: Secondary | ICD-10-CM | POA: Insufficient documentation

## 2015-06-16 DIAGNOSIS — Z79899 Other long term (current) drug therapy: Secondary | ICD-10-CM | POA: Insufficient documentation

## 2015-06-16 DIAGNOSIS — R079 Chest pain, unspecified: Secondary | ICD-10-CM | POA: Diagnosis present

## 2015-06-16 DIAGNOSIS — Z87891 Personal history of nicotine dependence: Secondary | ICD-10-CM | POA: Insufficient documentation

## 2015-06-16 DIAGNOSIS — J45909 Unspecified asthma, uncomplicated: Secondary | ICD-10-CM | POA: Insufficient documentation

## 2015-06-16 DIAGNOSIS — Z7984 Long term (current) use of oral hypoglycemic drugs: Secondary | ICD-10-CM | POA: Diagnosis not present

## 2015-06-16 LAB — CBC
HCT: 40.6 % (ref 36.0–46.0)
Hemoglobin: 13.4 g/dL (ref 12.0–15.0)
MCH: 28.9 pg (ref 26.0–34.0)
MCHC: 33 g/dL (ref 30.0–36.0)
MCV: 87.5 fL (ref 78.0–100.0)
Platelets: 281 10*3/uL (ref 150–400)
RBC: 4.64 MIL/uL (ref 3.87–5.11)
RDW: 13.1 % (ref 11.5–15.5)
WBC: 8.4 10*3/uL (ref 4.0–10.5)

## 2015-06-16 LAB — APTT: aPTT: 28 seconds (ref 24–37)

## 2015-06-16 LAB — BASIC METABOLIC PANEL
Anion gap: 13 (ref 5–15)
BUN: 9 mg/dL (ref 6–20)
CO2: 25 mmol/L (ref 22–32)
Calcium: 10 mg/dL (ref 8.9–10.3)
Chloride: 107 mmol/L (ref 101–111)
Creatinine, Ser: 0.52 mg/dL (ref 0.44–1.00)
GFR calc Af Amer: >60 mL/min (ref >60)
GFR calc non Af Amer: >60 mL/min (ref >60)
Glucose, Bld: 189 mg/dL — ABNORMAL HIGH (ref 65–99)
Potassium: 3.8 mmol/L (ref 3.5–5.1)
Sodium: 145 mmol/L (ref 135–145)

## 2015-06-16 LAB — DIFFERENTIAL
Basophils Absolute: 0 10*3/uL (ref 0.0–0.1)
Basophils Relative: 0 %
Eosinophils Absolute: 0.4 10*3/uL (ref 0.0–0.7)
Eosinophils Relative: 4 %
Lymphocytes Relative: 32 %
Lymphs Abs: 2.7 10*3/uL (ref 0.7–4.0)
Monocytes Absolute: 0.6 10*3/uL (ref 0.1–1.0)
Monocytes Relative: 7 %
Neutro Abs: 4.8 10*3/uL (ref 1.7–7.7)
Neutrophils Relative %: 57 %

## 2015-06-16 LAB — PROTIME-INR
INR: 1 (ref 0.00–1.49)
Prothrombin Time: 13.4 seconds (ref 11.6–15.2)

## 2015-06-16 LAB — I-STAT TROPONIN, ED: Troponin i, poc: 0 ng/mL (ref 0.00–0.08)

## 2015-06-16 MED ORDER — ASPIRIN 81 MG PO CHEW: 324.0000 mg | CHEWABLE_TABLET | Freq: Once | ORAL | Status: DC

## 2015-06-16 MED ORDER — GI COCKTAIL ~~LOC~~
30.0000 mL | Freq: Once | ORAL | Status: AC
  Administered 2015-06-16: 30 mL via ORAL
  Filled 2015-06-16: qty 30

## 2015-06-16 NOTE — Discharge Instructions (Signed)
Premature Ventricular Contraction A premature ventricular contraction is an irregularity in the normal heart rhythm. These contractions are extra heartbeats that occur too early in the normal sequence. In most cases, these contractions are harmless and do not require treatment. CAUSES Premature ventricular contractions may occur without a known cause. In healthy people, the extra contractions may be caused by:  Smoking.  Drinking alcohol.  Caffeine.  Certain medicines.  Some illegal drugs.  Stress. Sometimes, changes in chemicals in the blood (electrolytes) can also cause premature ventricular contractions. They can also occur in people with heart diseases that cause a decrease in blood flow to the heart. SIGNS AND SYMPTOMS Premature ventricular contractions often do not cause any symptoms. In some cases, you may have a feeling of your heart beating fast or skipping a beat (palpitations). DIAGNOSIS Your health care provider will take your medical history and do a physical exam. During the exam, the health care provider will check for irregular heartbeats. Various tests may be done to help diagnose premature ventricular contractions. These tests may include:  An ECG (electrocardiogram) to monitor the electrical activity of your heart.  Holter monitor testing. A Holter monitor is a portable device that can monitor the electrical activity of your heart over longer periods of time.  Stress tests to see how exercise affects your heart rhythm.  Echocardiogram. This test uses sound waves (ultrasound) to produce an image of your heart.  Electrophysiology study. This is used to evaluate the electrical conduction system of your heart. TREATMENT Usually, no treatment is needed. You may be advised to avoid things that can trigger the premature contractions, such as caffeine or alcohol. Medicines are sometimes given if symptoms are severe or if the extra heartbeats are very frequent. Treatment may  also be needed for an underlying cause of the contractions if one is found. HOME CARE INSTRUCTIONS  Take medicines only as directed by your health care provider.  Make any lifestyle changes recommended by your health care provider. These may include:  Quitting smoking.  Avoiding or limiting caffeine or alcohol.  Exercising. Talk to your health care provider about what type of exercise is safe for you.  Trying to reduce stress.  Keep all follow-up visits with your health care provider. This is important. SEEK IMMEDIATE MEDICAL CARE IF:  You feel palpitations that are frequent or continual.  You have chest pain.  You have shortness of breath.  You have sweating for no reason.  You have nausea and vomiting.  You become light-headed or faint.   This information is not intended to replace advice given to you by your health care provider. Make sure you discuss any questions you have with your health care provider.   Document Released: 12/14/2003 Document Revised: 05/19/2014 Document Reviewed: 09/29/2013 Elsevier Interactive Patient Education 2016 [employer]  

## 2015-06-16 NOTE — ED Notes (Signed)
Pt presents with central chest discomfort, pt states feeling woke her up yesterday @ 0700, feeling resolved then returned this evening. Pt denies n/v/d, denies SHOB. Pt states this week she used laxatives and has used her Breo inhaler. NS on CM with frequent PVC's. Pt A & O.

## 2015-06-16 NOTE — ED Provider Notes (Signed)
CSN: [id number]     Arrival date & time 06/16/15  0051 History  By signing my name below, I, Julien Nordmann, attest that this documentation has been prepared under the direction and in the presence of Nevaeh Casillas, MD. Electronically Signed: Julien Nordmann, ED Scribe. 06/16/2015. 1:29 AM.    Chief Complaint  Patient presents with  . Chest Pain     Patient is a 65 y.o. female presenting with chest pain and palpitations. The history is provided by the patient. No language interpreter was used.  Chest Pain Pain location:  Substernal area Pain quality: dull   Pain radiates to:  Does not radiate Pain radiates to the back: no   Pain severity:  Moderate Onset quality:  Sudden Duration:  1 day Timing:  Constant Progression:  Worsening Chronicity:  New Context: not breathing and no stress   Relieved by:  Nothing Worsened by:  Nothing tried Ineffective treatments:  None tried Associated symptoms: no anorexia, no cough, no diaphoresis, no fever, no palpitations, no shortness of breath, no syncope and not vomiting   Risk factors: diabetes mellitus   Risk factors: not female and no prior DVT/PE   Palpitations Palpitations quality:  Irregular Onset quality:  Gradual Timing:  Intermittent Progression:  Unchanged Chronicity:  New Context: not caffeine   Relieved by:  Nothing Worsened by:  Nothing Ineffective treatments:  None tried Associated symptoms: chest pain   Associated symptoms: no cough, no diaphoresis, no shortness of breath, no syncope and no vomiting   Risk factors: no hx of PE    HPI Comments: Aika Depies is a 65 y.o. female who has a hx of DM presents to the Emergency Department complaining of intermittent, moderate, gradual worsening central chest discomfort with associated PVC's onset this morning, 20 hours ago. Pt reports taking Breo and used Metamucil this week and is unsure if it caused her symptoms or not. She states she believes after she started her Breo on Wednesday  is when her symptoms started to occur. Pt has not taken any medication to alleviate her symptoms. Denies nausea, vomiting, diarrhea, and shortness of breath.  Past Medical History  Diagnosis Date  . Diabetes mellitus   . Colon polyp, hyperplastic 03/29/2010  . History of kidney stones   . Asthma    Past Surgical History  Procedure Laterality Date  . Total abdominal hysterectomy  1984  . Ectopic pregnancy surgery    . Ovarian cyst removal  35 years ago  . Cyst excision Left 15 years ago    Axillary    Family History  Problem Relation Age of Onset  . Diabetes Father   . Vaginal cancer Maternal Grandmother   . Heart disease Maternal Grandfather   . Heart attack Maternal Grandfather   . Diabetes Brother   . Hypertension Mother   . Hypertension Sister   . Thyroid disease Daughter   . Allergies Daughter   . Allergies Sister    Social History  Substance Use Topics  . Smoking status: Former Smoker -- 0.50 packs/day for 12 years    Types: Cigarettes    Quit date: 05/12/1980  . Smokeless tobacco: Never Used  . Alcohol Use: 0.0 oz/week    0 Standard drinks or equivalent per week     Comment: ocass   OB History    Gravida Para Term Preterm AB TAB SAB Ectopic Multiple Living   3 2 2  1  1   2      Review of Systems  Constitutional: Negative for fever and diaphoresis.  Respiratory: Negative for cough, shortness of breath and wheezing.   Cardiovascular: Positive for chest pain. Negative for palpitations, leg swelling and syncope.  Gastrointestinal: Negative for vomiting and anorexia.  All other systems reviewed and are negative.     Allergies  Review of patient's allergies indicates no known allergies.  Home Medications   Prior to Admission medications   Medication Sig Start Date End Date Taking? Authorizing Provider  albuterol (PROVENTIL HFA;VENTOLIN HFA) 108 (90 BASE) MCG/ACT inhaler Inhale 1-2 puffs into the lungs every 6 (six) hours as needed for wheezing or  shortness of breath. 08/26/14   Ezequiel Essex, MD  aspirin 81 MG chewable tablet Chew 81 mg by mouth daily.    Historical Provider, MD  docusate sodium (COLACE) 100 MG capsule Take 100 mg by mouth daily.    Historical Provider, MD  famotidine (PEPCID) 20 MG tablet One at bedtime 08/21/14   Tanda Rockers, MD  metFORMIN (GLUCOPHAGE-XR) 500 MG 24 hr tablet Take 500 mg by mouth daily with breakfast.    Historical Provider, MD  pantoprazole (PROTONIX) 40 MG tablet Take 1 tablet (40 mg total) by mouth daily. Take 30-60 min before first meal of the day 08/21/14   Tanda Rockers, MD   Triage vitals: BP 154/97 mmHg  Pulse 87  Temp(Src) 98.2 F (36.8 C) (Oral)  Resp 20  SpO2 98%  LMP 05/12/1982 Physical Exam  Constitutional: She is oriented to person, place, and time. She appears well-developed and well-nourished. No distress.  HENT:  Head: Normocephalic and atraumatic.  Mouth/Throat: Oropharynx is clear and moist.  Eyes: Conjunctivae are normal. Pupils are equal, round, and reactive to light.  Neck: Normal range of motion. Neck supple. No JVD present.  No Bruit's  Cardiovascular: Normal rate, regular rhythm and intact distal pulses.   Pulmonary/Chest: Effort normal and breath sounds normal. No stridor. No respiratory distress. She has no wheezes. She has no rales.  Abdominal: Soft. Bowel sounds are normal. She exhibits no mass. There is no tenderness. There is no rebound and no guarding.  Musculoskeletal: Normal range of motion. She exhibits no edema or tenderness.  Neurological: She is alert and oriented to person, place, and time. She has normal reflexes.  Skin: Skin is warm and dry.  Psychiatric: She has a normal mood and affect.  Nursing note and vitals reviewed.   ED Course  Procedures  DIAGNOSTIC STUDIES: Oxygen Saturation is 98% on RA, normal by my interpretation.  COORDINATION OF CARE:  1:25 AM Discussed treatment plan with pt which includes CXR at bedside and pt agreed to  plan.  Labs Review Labs Reviewed - No data to display  Imaging Review No results found. I have personally reviewed and evaluated these images and lab results as part of my medical decision-making.   EKG Interpretation None      MDM   Final diagnoses:  None    EKG Interpretation  Date/Time:  Saturday June 16 2015 00:58:36 EST Ventricular Rate:  81 PR Interval:  200 QRS Duration: 96 QT Interval:  381 QTC Calculation: 442 R Axis:   55 Text Interpretation:  Sinus rhythm Premature ventricular and fusion complexes Confirmed by [facility]  MD, Yigit Norkus ([id number]) on 06/16/2015 1:59:12 AM       PVCs improved during visit.  Pressure better with GI cocktail.  Will refer to cards for halter monitor.  No caffeine, no alcohol.  Limit stress.  In the setting of normal EKG and  troponin and > 8 hours of continuous pain ACS is excluded  I personally performed the services described in this documentation, which was scribed in my presence. The recorded information has been reviewed and is accurate.     Veatrice Kells, MD 06/16/15 [phone]

## 2015-07-31 ENCOUNTER — Encounter: Payer: Self-pay | Admitting: Internal Medicine

## 2016-06-04 ENCOUNTER — Other Ambulatory Visit: Payer: Self-pay | Admitting: Family Medicine

## 2016-06-04 DIAGNOSIS — Z1231 Encounter for screening mammogram for malignant neoplasm of breast: Secondary | ICD-10-CM

## 2016-06-17 ENCOUNTER — Ambulatory Visit
Admission: RE | Admit: 2016-06-17 | Discharge: 2016-06-17 | Disposition: A | Discharge status: Home / Self Care | Payer: BC Managed Care – PPO | Source: Ambulatory Visit | Attending: Family Medicine | Admitting: Family Medicine

## 2016-06-17 DIAGNOSIS — Z1231 Encounter for screening mammogram for malignant neoplasm of breast: Secondary | ICD-10-CM

## 2016-07-08 DIAGNOSIS — H00019 Hordeolum externum unspecified eye, unspecified eyelid: Secondary | ICD-10-CM | POA: Insufficient documentation

## 2017-07-08 ENCOUNTER — Emergency Department (HOSPITAL_COMMUNITY): Payer: Medicare Other

## 2017-07-08 ENCOUNTER — Encounter (HOSPITAL_COMMUNITY): Payer: Self-pay | Admitting: Emergency Medicine

## 2017-07-08 ENCOUNTER — Emergency Department (HOSPITAL_COMMUNITY)
Admission: EM | Admit: 2017-07-08 | Discharge: 2017-07-08 | Disposition: A | Discharge status: Home / Self Care | Payer: Medicare Other | Attending: Emergency Medicine | Admitting: Emergency Medicine

## 2017-07-08 DIAGNOSIS — R0602 Shortness of breath: Secondary | ICD-10-CM | POA: Diagnosis present

## 2017-07-08 DIAGNOSIS — Z87891 Personal history of nicotine dependence: Secondary | ICD-10-CM | POA: Insufficient documentation

## 2017-07-08 DIAGNOSIS — E119 Type 2 diabetes mellitus without complications: Secondary | ICD-10-CM | POA: Insufficient documentation

## 2017-07-08 DIAGNOSIS — J40 Bronchitis, not specified as acute or chronic: Secondary | ICD-10-CM

## 2017-07-08 DIAGNOSIS — Z7984 Long term (current) use of oral hypoglycemic drugs: Secondary | ICD-10-CM | POA: Diagnosis not present

## 2017-07-08 DIAGNOSIS — Z7982 Long term (current) use of aspirin: Secondary | ICD-10-CM | POA: Insufficient documentation

## 2017-07-08 DIAGNOSIS — Z79899 Other long term (current) drug therapy: Secondary | ICD-10-CM | POA: Insufficient documentation

## 2017-07-08 LAB — CBC
HCT: 40.2 % (ref 36.0–46.0)
Hemoglobin: 13.6 g/dL (ref 12.0–15.0)
MCH: 29.7 pg (ref 26.0–34.0)
MCHC: 33.8 g/dL (ref 30.0–36.0)
MCV: 87.8 fL (ref 78.0–100.0)
Platelets: 277 10*3/uL (ref 150–400)
RBC: 4.58 MIL/uL (ref 3.87–5.11)
RDW: 12.9 % (ref 11.5–15.5)
WBC: 8.5 10*3/uL (ref 4.0–10.5)

## 2017-07-08 LAB — CBG MONITORING, ED: Glucose-Capillary: 189 mg/dL — ABNORMAL HIGH (ref 65–99)

## 2017-07-08 LAB — BASIC METABOLIC PANEL
Anion gap: 8 (ref 5–15)
BUN: 10 mg/dL (ref 6–20)
CO2: 25 mmol/L (ref 22–32)
Calcium: 9.2 mg/dL (ref 8.9–10.3)
Chloride: 104 mmol/L (ref 101–111)
Creatinine, Ser: 0.54 mg/dL (ref 0.44–1.00)
GFR calc Af Amer: >60 mL/min (ref >60)
GFR calc non Af Amer: >60 mL/min (ref >60)
Glucose, Bld: 170 mg/dL — ABNORMAL HIGH (ref 65–99)
Potassium: 4 mmol/L (ref 3.5–5.1)
Sodium: 137 mmol/L (ref 135–145)

## 2017-07-08 LAB — I-STAT TROPONIN, ED
Troponin i, poc: 0 ng/mL (ref 0.00–0.08)
Troponin i, poc: 0 ng/mL (ref 0.00–0.08)

## 2017-07-08 MED ORDER — BENZONATATE 100 MG PO CAPS: 100.0000 mg | ORAL_CAPSULE | Freq: Three times a day (TID) | ORAL | 0 refills | Status: DC

## 2017-07-08 MED ORDER — GI COCKTAIL ~~LOC~~
30.0000 mL | Freq: Once | ORAL | Status: AC
  Administered 2017-07-08: 30 mL via ORAL
  Filled 2017-07-08: qty 30

## 2017-07-08 MED ORDER — GUAIFENESIN 200 MG PO TABS: 200.0000 mg | ORAL_TABLET | ORAL | 0 refills | Status: DC | PRN

## 2017-07-08 MED ORDER — ALBUTEROL SULFATE HFA 108 (90 BASE) MCG/ACT IN AERS
2.0000 | INHALATION_SPRAY | RESPIRATORY_TRACT | Status: DC | PRN
  Administered 2017-07-08: 2 via RESPIRATORY_TRACT
  Filled 2017-07-08: qty 6.7

## 2017-07-08 NOTE — ED Triage Notes (Signed)
Patient presents ambulatory c/o similar symptoms from 2 years ago where she was prescribed steroids and inhaler that helped with symptoms. C/o shortness of breath and indigestion feeling onset of Friday night. Pt reports one episode of emesis after having alcoholic beverage, but is now having cramp in neck since.

## 2017-07-08 NOTE — Discharge Instructions (Signed)
Please use albuterol inhaler 2 puffs every 4 hours as needed for your wheezing and shortness of breath.  Take Tessalon for cough and guaifenesin for congestion.  Follow-up with your doctor for further management.  Return if you have any concern.

## 2017-07-08 NOTE — ED Provider Notes (Signed)
[facility] DEPT Provider Note   CSN: [id number] Arrival date & time: 07/08/17  1042     History   Chief Complaint Chief Complaint  Patient presents with  . Shortness of Breath    HPI Amy [name] is a 67 y.o. female.  HPI   67 year old female with history of asthma, diabetes, kidney stone presenting complaining of shortness of breath.  Patient mentioned 2 years ago she has shortness of breath and chest congestion and was prescribed steroid inhaler to help with her symptoms.  For the past for 5 days she has the same symptoms of sinus congestion, chest congestion, some mild shortness of breath.  She used 1 of her inhaler that was prescribed previously called Breo but states that she developed indigestion from the medication.  She described as an uncomfortable sensation to her midsternal region as well as her right side of chest.  Symptom has been waxing and waning.  She also went down to the beach over the weekend and admits to eating spicy food and drink alcoholic beverage.  Shortly after, she developed more heartburn and indigestion.  She still complaining some chest congestion.  She was seen by her PCP was giving a course of steroid but her symptoms still persist.  At this time symptom is minimal, she does want some treatment for her congestion.  She denies having fever, lightheadedness, dizziness, diaphoresis, back pain or rash.  Denies any prior cardiac history.  Past Medical History:  Diagnosis Date  . Asthma   . Colon polyp, hyperplastic 03/29/2010  . Diabetes mellitus   . History of kidney stones     Patient Active Problem List   Diagnosis Date Noted  . Cough 04/27/2014  . Diabetes ([place]) 08/10/2013  . History of kidney stones   . Colon polyp, hyperplastic 03/29/2010    Past Surgical History:  Procedure Laterality Date  . CYST EXCISION Left 15 years ago   Axillary   . ECTOPIC PREGNANCY SURGERY    . OVARIAN CYST REMOVAL  35 years ago  .  TOTAL ABDOMINAL HYSTERECTOMY  1984    OB History    Gravida Para Term Preterm AB Living   3 2 2   1 2    SAB TAB Ectopic Multiple Live Births   1               Home Medications    Prior to Admission medications   Medication Sig Start Date End Date Taking? Authorizing Provider  albuterol (PROVENTIL HFA;VENTOLIN HFA) 108 (90 BASE) MCG/ACT inhaler Inhale 1-2 puffs into the lungs every 6 (six) hours as needed for wheezing or shortness of breath. 08/26/14   Rancour, Annie Main, MD  aspirin 81 MG chewable tablet Chew 81 mg by mouth daily.    [provider]  cholecalciferol (VITAMIN D) 1000 units tablet Take 1,000 Units by mouth daily.    [provider]  docusate sodium (COLACE) 100 MG capsule Take 100 mg by mouth daily.    [provider]  famotidine (PEPCID) 20 MG tablet One at bedtime Patient not taking: Reported on 06/16/2015 08/21/14   Tanda Rockers, MD  metFORMIN (GLUCOPHAGE-XR) 500 MG 24 hr tablet Take 500 mg by mouth daily with breakfast.    [provider]  pantoprazole (PROTONIX) 40 MG tablet Take 1 tablet (40 mg total) by mouth daily. Take 30-60 min before first meal of the day Patient not taking: Reported on 06/16/2015 08/21/14   Tanda Rockers, MD  Family History Family History  Problem Relation Age of Onset  . Diabetes Father   . Diabetes Brother   . Hypertension Mother   . Hypertension Sister   . Vaginal cancer Maternal Grandmother   . Heart disease Maternal Grandfather   . Heart attack Maternal Grandfather   . Thyroid disease Daughter   . Allergies Daughter   . Allergies Sister     Social History Social History   Tobacco Use  . Smoking status: Former Smoker    Packs/day: 0.50    Years: 12.00    Pack years: 6.00    Types: Cigarettes    Last attempt to quit: 05/12/1980    Years since quitting: 37.1  . Smokeless tobacco: Never Used  Substance Use Topics  . Alcohol use: Yes    Alcohol/week: 0.0 oz    Comment: ocass  . Drug  use: No     Allergies   Patient has no known allergies.   Review of Systems Review of Systems  All other systems reviewed and are negative.    Physical Exam Updated Vital Signs BP (!) 134/94 (BP Location: Right Arm)   Pulse 78   Temp 97.9 F (36.6 C) (Oral)   Resp 14   LMP 05/12/1982   SpO2 100%   Physical Exam  Constitutional: She is oriented to person, place, and time. She appears well-developed and well-nourished. No distress.  HENT:  Head: Atraumatic.  Eyes: Conjunctivae are normal.  Neck: Neck supple.  Cardiovascular: Normal rate and regular rhythm.  Pulmonary/Chest: Effort normal and breath sounds normal. She has no wheezes. She has no rhonchi. She has no rales.  Abdominal: Soft. There is no tenderness.  Musculoskeletal: Normal range of motion.       Right lower leg: She exhibits no edema.       Left lower leg: She exhibits no edema.  Neurological: She is alert and oriented to person, place, and time.  Skin: Skin is warm. Capillary refill takes less than 2 seconds. No rash noted.  Psychiatric: She has a normal mood and affect.  Nursing note and vitals reviewed.    ED Treatments / Results  Labs (all labs ordered are listed, but only abnormal results are displayed) Labs Reviewed  BASIC METABOLIC PANEL - Abnormal; Notable for the following components:      Result Value   Glucose, Bld 170 (*)    All other components within normal limits  CBG MONITORING, ED - Abnormal; Notable for the following components:   Glucose-Capillary 189 (*)    All other components within normal limits  CBC  I-STAT TROPONIN, ED  I-STAT TROPONIN, ED    EKG  EKG Interpretation  Date/Time:  Wednesday July 08 2017 10:49:41 EST Ventricular Rate:  83 PR Interval:    QRS Duration: 93 QT Interval:  382 QTC Calculation: 449 R Axis:   67 Text Interpretation:  Sinus rhythm Ventricular premature complex Low voltage, precordial leads similar to prior 2/17 Confirmed by Aletta Edouard [phone]) on 07/08/2017 3:51:23 PM       Radiology Dg Chest 2 View  Result Date: 07/08/2017 CLINICAL DATA:  Shortness of breath, sensation of indigestion several nights ago. Former smoker. EXAM: CHEST  2 VIEW COMPARISON:  Chest x-ray of June 16, 2015 FINDINGS: The lungs are well-expanded. The interstitial markings are increased bilaterally. There is no alveolar infiltrate or pleural effusion. The heart and pulmonary vascularity are normal. There is mild multilevel degenerative disc disease of the thoracic spine. IMPRESSION: Mildly increased  prominence of the interstitial markings bilaterally as compared to the previous study. No focal pneumonia. The findings may reflect exacerbation of reactive airway disease or acute bronchitis superimposed upon chronic bronchitis and smoking related changes. No evidence of pulmonary edema. Electronically Signed   By: David  Martinique M.D.   On: 07/08/2017 11:21    Procedures Procedures (including critical care time)  Medications Ordered in ED Medications  gi cocktail (Maalox,Lidocaine,Donnatal) (30 mLs Oral Given 07/08/17 1536)     Initial Impression / Assessment and Plan / ED Course  I have reviewed the triage vital signs and the nursing notes.  Pertinent labs & imaging results that were available during my care of the patient were reviewed by me and considered in my medical decision making (see chart for details).     BP (!) 134/94 (BP Location: Right Arm)   Pulse 78   Temp 97.9 F (36.6 C) (Oral)   Resp 14   LMP 05/12/1982   SpO2 100%    Final Clinical Impressions(s) / ED Diagnoses   Final diagnoses:  Bronchitis    ED Discharge Orders        Ordered    benzonatate (TESSALON) 100 MG capsule  Every 8 hours     07/08/17 1634    guaiFENesin 200 MG tablet  Every 4 hours PRN     07/08/17 1634     3:21 PM Patient's primary complaint is more of a chest congestion/esophageal discomfort or reflux symptoms.  This was brought on by  eating spicy food, and as well as using Breo inhaler.  I suspect the steroid inhaler may have caused some esophagitis.  Her pain is not suggestive of ACS or PE.  She is well-appearing, no hypoxia, no active shortness of breath.  Her labs are reassuring, chest x-ray shown no evidence of pneumonia but may reflect reactive airway disease or acute bronchitis.  She was seen by her PCP recently and was given steroid.  She is requesting additional medication to help with her congestion and indigestion.  Plan to obtain serial troponin  4:29 PM Normal delta troponin.  Plan to treat patient's bronchitis with albuterol rescue inhaler, cough medication, and decongestant.  Patient does not want any additional steroid stating that she finished one very recently.  She otherwise stable for discharge.  No hypoxia.  Return precautions discussed.   Domenic Moras, PA-C 07/08/17 1635    Hayden Rasmussen, MD 07/09/17 [phone]

## 2017-08-19 ENCOUNTER — Emergency Department (HOSPITAL_COMMUNITY)
Admission: EM | Admit: 2017-08-19 | Discharge: 2017-08-19 | Disposition: A | Discharge status: Home / Self Care | Payer: Medicare Other | Attending: Emergency Medicine | Admitting: Emergency Medicine

## 2017-08-19 ENCOUNTER — Emergency Department (HOSPITAL_COMMUNITY): Payer: Medicare Other

## 2017-08-19 ENCOUNTER — Encounter (HOSPITAL_COMMUNITY): Payer: Self-pay

## 2017-08-19 DIAGNOSIS — Z7984 Long term (current) use of oral hypoglycemic drugs: Secondary | ICD-10-CM | POA: Diagnosis not present

## 2017-08-19 DIAGNOSIS — J40 Bronchitis, not specified as acute or chronic: Secondary | ICD-10-CM | POA: Diagnosis not present

## 2017-08-19 DIAGNOSIS — E119 Type 2 diabetes mellitus without complications: Secondary | ICD-10-CM | POA: Insufficient documentation

## 2017-08-19 DIAGNOSIS — Z79899 Other long term (current) drug therapy: Secondary | ICD-10-CM | POA: Insufficient documentation

## 2017-08-19 DIAGNOSIS — Z87891 Personal history of nicotine dependence: Secondary | ICD-10-CM | POA: Diagnosis not present

## 2017-08-19 DIAGNOSIS — R05 Cough: Secondary | ICD-10-CM | POA: Diagnosis present

## 2017-08-19 LAB — CBC WITH DIFFERENTIAL/PLATELET
Basophils Absolute: 0 10*3/uL (ref 0.0–0.1)
Basophils Relative: 0 %
Eosinophils Absolute: 0.2 10*3/uL (ref 0.0–0.7)
Eosinophils Relative: 2 %
HCT: 40.7 % (ref 36.0–46.0)
Hemoglobin: 13.6 g/dL (ref 12.0–15.0)
Lymphocytes Relative: 15 %
Lymphs Abs: 1.4 10*3/uL (ref 0.7–4.0)
MCH: 29.4 pg (ref 26.0–34.0)
MCHC: 33.4 g/dL (ref 30.0–36.0)
MCV: 88.1 fL (ref 78.0–100.0)
Monocytes Absolute: 0.6 10*3/uL (ref 0.1–1.0)
Monocytes Relative: 7 %
Neutro Abs: 6.9 10*3/uL (ref 1.7–7.7)
Neutrophils Relative %: 76 %
Platelets: 261 10*3/uL (ref 150–400)
RBC: 4.62 MIL/uL (ref 3.87–5.11)
RDW: 13.1 % (ref 11.5–15.5)
WBC: 9.1 10*3/uL (ref 4.0–10.5)

## 2017-08-19 LAB — COMPREHENSIVE METABOLIC PANEL
ALT: 19 U/L (ref 14–54)
AST: 20 U/L (ref 15–41)
Albumin: 3.9 g/dL (ref 3.5–5.0)
Alkaline Phosphatase: 70 U/L (ref 38–126)
Anion gap: 11 (ref 5–15)
BUN: 6 mg/dL (ref 6–20)
CO2: 22 mmol/L (ref 22–32)
Calcium: 9.2 mg/dL (ref 8.9–10.3)
Chloride: 106 mmol/L (ref 101–111)
Creatinine, Ser: 0.46 mg/dL (ref 0.44–1.00)
GFR calc Af Amer: >60 mL/min (ref >60)
GFR calc non Af Amer: >60 mL/min (ref >60)
Glucose, Bld: 164 mg/dL — ABNORMAL HIGH (ref 65–99)
Potassium: 3.8 mmol/L (ref 3.5–5.1)
Sodium: 139 mmol/L (ref 135–145)
Total Bilirubin: 0.3 mg/dL (ref 0.3–1.2)
Total Protein: 7.6 g/dL (ref 6.5–8.1)

## 2017-08-19 MED ORDER — ALBUTEROL SULFATE (2.5 MG/3ML) 0.083% IN NEBU
5.0000 mg | INHALATION_SOLUTION | Freq: Once | RESPIRATORY_TRACT | Status: AC
  Administered 2017-08-19: 5 mg via RESPIRATORY_TRACT
  Filled 2017-08-19: qty 6

## 2017-08-19 MED ORDER — DIPHENHYDRAMINE HCL 50 MG/ML IJ SOLN
25.0000 mg | Freq: Once | INTRAMUSCULAR | Status: AC
  Administered 2017-08-19: 25 mg via INTRAVENOUS
  Filled 2017-08-19: qty 1

## 2017-08-19 MED ORDER — PREDNISONE 20 MG PO TABS: ORAL_TABLET | ORAL | 0 refills | Status: DC

## 2017-08-19 MED ORDER — METHYLPREDNISOLONE SODIUM SUCC 125 MG IJ SOLR
125.0000 mg | Freq: Once | INTRAMUSCULAR | Status: AC
  Administered 2017-08-19: 125 mg via INTRAVENOUS
  Filled 2017-08-19: qty 2

## 2017-08-19 MED ORDER — ALBUTEROL SULFATE HFA 108 (90 BASE) MCG/ACT IN AERS
2.0000 | INHALATION_SPRAY | Freq: Once | RESPIRATORY_TRACT | Status: AC
  Administered 2017-08-19: 2 via RESPIRATORY_TRACT
  Filled 2017-08-19: qty 6.7

## 2017-08-19 MED ORDER — IPRATROPIUM BROMIDE 0.02 % IN SOLN
0.5000 mg | Freq: Once | RESPIRATORY_TRACT | Status: AC
  Administered 2017-08-19: 0.5 mg via RESPIRATORY_TRACT
  Filled 2017-08-19: qty 2.5

## 2017-08-19 MED ORDER — SODIUM CHLORIDE 0.9 % IV BOLUS
1000.0000 mL | Freq: Once | INTRAVENOUS | Status: AC
  Administered 2017-08-19: 1000 mL via INTRAVENOUS

## 2017-08-19 NOTE — ED Provider Notes (Signed)
[facility] DEPT Provider Note   CSN: [id number] Arrival date & time: 08/19/17  0506     History   Chief Complaint Chief Complaint  Patient presents with  . Cough    HPI Amy [name] is a 67 y.o. female history of asthma, diabetes, previous kidney failure presenting with cough, congestion.  Patient states that she was seen here recently was diagnosed with bronchitis and was given albuterol as well as a cough medicine.  She states that the cough has improved this morning, patient has been coughing up more and felt that some mucus was stuck in her throat.  She has some subjective shortness of breath.  Denies any chest pain or fevers.  Patient states that she has been having more sinus congestion and postnasal drip as well due to the allergy seasons.  Patient denies any sick contacts.   The history is provided by the patient.    Past Medical History:  Diagnosis Date  . Asthma   . Colon polyp, hyperplastic 03/29/2010  . Diabetes mellitus   . History of kidney stones     Patient Active Problem List   Diagnosis Date Noted  . Cough 04/27/2014  . Diabetes ([place]) 08/10/2013  . History of kidney stones   . Colon polyp, hyperplastic 03/29/2010    Past Surgical History:  Procedure Laterality Date  . CYST EXCISION Left 15 years ago   Axillary   . ECTOPIC PREGNANCY SURGERY    . OVARIAN CYST REMOVAL  35 years ago  . TOTAL ABDOMINAL HYSTERECTOMY  1984     OB History    Gravida  3   Para  2   Term  2   Preterm      AB  1   Living  2     SAB  1   TAB      Ectopic      Multiple      Live Births               Home Medications    Prior to Admission medications   Medication Sig Start Date End Date Taking? Authorizing Provider  aspirin 81 MG chewable tablet Chew 81 mg by mouth daily.   Yes [provider]  BREO ELLIPTA 200-25 MCG/INH AEPB Inhale 1 puff into the lungs daily as needed (wheezing).  06/16/17  Yes [provider]  cholecalciferol (VITAMIN D) 1000 units tablet Take 1,000 Units by mouth daily.   Yes [provider]  docusate sodium (COLACE) 100 MG capsule Take 100 mg by mouth daily.   Yes [provider]  guaiFENesin 200 MG tablet Take 1 tablet (200 mg total) by mouth every 4 (four) hours as needed for cough or to loosen phlegm. 07/08/17  Yes Domenic Moras, PA-C  loratadine (CLARITIN) 10 MG tablet Take 10 mg by mouth daily as needed for allergies.   Yes [provider]  metFORMIN (GLUCOPHAGE-XR) 500 MG 24 hr tablet Take 500 mg by mouth daily with breakfast.   Yes [provider]  Multiple Vitamins-Minerals (ALIVE ONCE DAILY WOMENS PO) Take 1 tablet by mouth daily.   Yes [provider]  benzonatate (TESSALON) 100 MG capsule Take 1 capsule (100 mg total) by mouth every 8 (eight) hours. Patient not taking: Reported on 08/19/2017 07/08/17   Domenic Moras, PA-C    Family History Family History  Problem Relation Age of Onset  . Diabetes Father   . Diabetes Brother   . Hypertension  Mother   . Hypertension Sister   . Vaginal cancer Maternal Grandmother   . Heart disease Maternal Grandfather   . Heart attack Maternal Grandfather   . Thyroid disease Daughter   . Allergies Daughter   . Allergies Sister     Social History Social History   Tobacco Use  . Smoking status: Former Smoker    Packs/day: 0.50    Years: 12.00    Pack years: 6.00    Types: Cigarettes    Last attempt to quit: 05/12/1980    Years since quitting: 37.2  . Smokeless tobacco: Never Used  Substance Use Topics  . Alcohol use: Yes    Alcohol/week: 0.0 oz    Comment: ocass  . Drug use: No     Allergies   Patient has no known allergies.   Review of Systems Review of Systems  Respiratory: Positive for cough.   All other systems reviewed and are negative.    Physical Exam Updated Vital Signs BP (!) 159/95 (BP Location: Left Arm)   Pulse (!) 109   Temp 99.3 F (37.4  C) (Oral)   Resp (!) 22   Ht 5\' 6"  (1.676 m)   Wt 106.6 kg (235 lb)   LMP 05/12/1982   SpO2 98%   BMI 37.93 kg/m   Physical Exam  Constitutional: She is oriented to person, place, and time.  + sinus congestion   HENT:  Head: Normocephalic.  Mouth/Throat: Oropharynx is clear and moist.  Eyes: Pupils are equal, round, and reactive to light. Conjunctivae and EOM are normal.  Neck: Normal range of motion. Neck supple.  No stridor   Cardiovascular: Normal rate, regular rhythm and normal heart sounds.  Pulmonary/Chest:  Diminished throughout, minimal wheezing throughout, no retractions   Abdominal: Soft. Bowel sounds are normal. She exhibits no distension. There is no tenderness. There is no guarding.  Musculoskeletal: Normal range of motion.  Neurological: She is alert and oriented to person, place, and time.  Skin: Skin is warm.  Psychiatric: She has a normal mood and affect.  Nursing note and vitals reviewed.    ED Treatments / Results  Labs (all labs ordered are listed, but only abnormal results are displayed) Labs Reviewed  COMPREHENSIVE METABOLIC PANEL - Abnormal; Notable for the following components:      Result Value   Glucose, Bld 164 (*)    All other components within normal limits  CBC WITH DIFFERENTIAL/PLATELET    EKG None  Radiology Dg Chest 2 View  Result Date: 08/19/2017 CLINICAL DATA:  Cough and congestion. EXAM: CHEST - 2 VIEW COMPARISON:  07/08/2017 FINDINGS: The heart size appears within normal limits. There is no pleural effusion or edema identified. Diminished lung volumes. No airspace opacities identified. Spondylosis noted within the thoracic spine. IMPRESSION: No active cardiopulmonary disease. Electronically Signed   By: Kerby Moors M.D.   On: 08/19/2017 07:44    Procedures Procedures (including critical care time)  Medications Ordered in ED Medications  sodium chloride 0.9 % bolus 1,000 mL (1,000 mLs Intravenous New Bag/Given 08/19/17  0807)  albuterol (PROVENTIL) (2.5 MG/3ML) 0.083% nebulizer solution 5 mg (5 mg Nebulization Given 08/19/17 0808)  ipratropium (ATROVENT) nebulizer solution 0.5 mg (0.5 mg Nebulization Given 08/19/17 0808)  methylPREDNISolone sodium succinate (SOLU-MEDROL) 125 mg/2 mL injection 125 mg (125 mg Intravenous Given 08/19/17 0808)  diphenhydrAMINE (BENADRYL) injection 25 mg (25 mg Intravenous Given 08/19/17 0817)     Initial Impression / Assessment and Plan / ED Course  I have  reviewed the triage vital signs and the nursing notes.  Pertinent labs & imaging results that were available during my care of the patient were reviewed by me and considered in my medical decision making (see chart for details).     Amy [name] is a 67 y.o. female here with cough, congestion, post nasal drip. Likely seasonal allergies vs bronchitis vs asthma exacerbation. Will get labs, CXR. Will give nebs and steroids and reassess.   9:02 AM Labs and CXR unremarkable. Minimal wheezing after nebs, steroids. Will dc home with the same. Stable for discharge.   Final Clinical Impressions(s) / ED Diagnoses   Final diagnoses:  None    ED Discharge Orders    None       Drenda Freeze, MD 08/19/17 [phone]

## 2017-08-19 NOTE — ED Triage Notes (Signed)
Pt states that she had been coughing up mucous and this am it was stuck in her throat, she finally got it back down and she's scared to cough anymore because it will block her airway

## 2017-08-19 NOTE — Discharge Instructions (Signed)
Take prednisone as prescribed.   Use albuterol every 4-6 hrs for cough or wheezing   See your doctor  Return to ER if you have worse shortness of breath, wheezing, cough, fever

## 2017-08-21 ENCOUNTER — Emergency Department (HOSPITAL_COMMUNITY)
Admission: EM | Admit: 2017-08-21 | Discharge: 2017-08-21 | Disposition: A | Discharge status: Home / Self Care | Payer: Medicare Other | Attending: Emergency Medicine | Admitting: Emergency Medicine

## 2017-08-21 ENCOUNTER — Encounter (HOSPITAL_COMMUNITY): Payer: Self-pay | Admitting: Emergency Medicine

## 2017-08-21 DIAGNOSIS — E119 Type 2 diabetes mellitus without complications: Secondary | ICD-10-CM | POA: Diagnosis not present

## 2017-08-21 DIAGNOSIS — J45909 Unspecified asthma, uncomplicated: Secondary | ICD-10-CM | POA: Insufficient documentation

## 2017-08-21 DIAGNOSIS — R05 Cough: Secondary | ICD-10-CM | POA: Diagnosis not present

## 2017-08-21 DIAGNOSIS — B9789 Other viral agents as the cause of diseases classified elsewhere: Secondary | ICD-10-CM

## 2017-08-21 DIAGNOSIS — Z87891 Personal history of nicotine dependence: Secondary | ICD-10-CM | POA: Diagnosis not present

## 2017-08-21 DIAGNOSIS — R062 Wheezing: Secondary | ICD-10-CM

## 2017-08-21 DIAGNOSIS — J069 Acute upper respiratory infection, unspecified: Secondary | ICD-10-CM | POA: Diagnosis not present

## 2017-08-21 DIAGNOSIS — Z7984 Long term (current) use of oral hypoglycemic drugs: Secondary | ICD-10-CM | POA: Insufficient documentation

## 2017-08-21 MED ORDER — NAPHAZOLINE-PHENIRAMINE 0.025-0.3 % OP SOLN: 1.0000 [drp] | Freq: Four times a day (QID) | OPHTHALMIC | 0 refills | Status: DC | PRN

## 2017-08-21 MED ORDER — BENZONATATE 100 MG PO CAPS: 100.0000 mg | ORAL_CAPSULE | Freq: Three times a day (TID) | ORAL | 0 refills | Status: DC | PRN

## 2017-08-21 MED ORDER — FLUTICASONE PROPIONATE 50 MCG/ACT NA SUSP: 2.0000 | Freq: Every day | NASAL | 0 refills | Status: DC

## 2017-08-21 MED ORDER — IPRATROPIUM-ALBUTEROL 0.5-2.5 (3) MG/3ML IN SOLN
3.0000 mL | Freq: Once | RESPIRATORY_TRACT | Status: AC
  Administered 2017-08-21: 3 mL via RESPIRATORY_TRACT
  Filled 2017-08-21: qty 3

## 2017-08-21 NOTE — Discharge Instructions (Signed)
Continue taking your prednisone as prescribed.  Use your albuterol inhaler every 6 hours as needed for shortness of breath.  You may take Tessalon as needed for cough.  Use Flonase for nasal congestion.  You may continue taking Benadryl up to every 6 hours as needed for your symptoms.  You can also use an over-the-counter allergy medicine such as Allegra or Zyrtec in place of your Benadryl.  Use the eyedrops as needed up to every 6 hours for itching of the eyes.  Follow-up with your primary care physician for reevaluation of your symptoms.  Return to the emergency department if any concerning signs or symptoms develop such as persisting chest pain, shortness of breath, high fevers, or coughing up blood.

## 2017-08-21 NOTE — ED Triage Notes (Signed)
Pt reports that she was seen here two days ago and given inhalers, steroids and been taking. But reports her cough is dry and still congested and has not sleep as good as Wed night when given Benadryl here. pt denied taking any more OTC Benadryl at home since, just states that her Claritin isnt working.

## 2017-08-21 NOTE — ED Provider Notes (Signed)
[facility] DEPT Provider Note   CSN: [id number] Arrival date & time: 08/21/17  1404     History   Chief Complaint Chief Complaint  Patient presents with  . Cough  . Nasal Congestion    HPI Amy [name] is a 67 y.o. female with history of asthma, diabetes presents today for evaluation of ongoing nasal congestion and cough.  She was seen and evaluated 2 days ago on 08/19/17 for these symptoms and was given albuterol and steroids with improvement in her symptoms.  Chest x-ray was negative at that time.  She notes a nonproductive cough but states "it feels like there is something that wants to be coughed up ".  She also notes nasal congestion, watery eyes and itchy eyes and mild itching to the back of the throat.  She states that "I get this way every year around the springtime ".    She notes that she took Benadryl 3 days ago which was helpful for her symptoms but has not had any since then. She notes mild shortness of breath which worsens during her coughing fits. Coughing worsens at night.  She was seen and evaluated by her primary care physician who had prescribed Brio in the past for her which she states is helpful.  She denies any indigestion symptoms although it is because this in the past.  She is also been using an albuterol inhaler twice with mild temporary relief of her symptoms.  She is a former smoker and quit several decades ago.  No recent travel or surgeries, no hemoptysis, no prior history of DVT or PE.  The history is provided by the patient.    Past Medical History:  Diagnosis Date  . Asthma   . Colon polyp, hyperplastic 03/29/2010  . Diabetes mellitus   . History of kidney stones     Patient Active Problem List   Diagnosis Date Noted  . Cough 04/27/2014  . Diabetes ([place]) 08/10/2013  . History of kidney stones   . Colon polyp, hyperplastic 03/29/2010    Past Surgical History:  Procedure Laterality Date  . CYST EXCISION Left 15  years ago   Axillary   . ECTOPIC PREGNANCY SURGERY    . OVARIAN CYST REMOVAL  35 years ago  . TOTAL ABDOMINAL HYSTERECTOMY  1984     OB History    Gravida  3   Para  2   Term  2   Preterm      AB  1   Living  2     SAB  1   TAB      Ectopic      Multiple      Live Births               Home Medications    Prior to Admission medications   Medication Sig Start Date End Date Taking? Authorizing Provider  metFORMIN (GLUCOPHAGE-XR) 500 MG 24 hr tablet Take 500 mg by mouth daily with breakfast.   Yes [provider]  aspirin 81 MG chewable tablet Chew 81 mg by mouth daily.    [provider]  benzonatate (TESSALON) 100 MG capsule Take 1 capsule (100 mg total) by mouth 3 (three) times daily as needed for cough. 08/21/17   Logan Vegh A, PA-C  cholecalciferol (VITAMIN D) 1000 units tablet Take 1,000 Units by mouth daily.    [provider]  docusate sodium (COLACE) 100 MG capsule Take 100 mg by mouth daily.  [provider]  fluticasone (FLONASE) 50 MCG/ACT nasal spray Place 2 sprays into both nostrils daily. 08/21/17   Londa Mackowski A, PA-C  guaiFENesin 200 MG tablet Take 1 tablet (200 mg total) by mouth every 4 (four) hours as needed for cough or to loosen phlegm. Patient not taking: Reported on 08/21/2017 07/08/17   Domenic Moras, PA-C  loratadine (CLARITIN) 10 MG tablet Take 10 mg by mouth daily as needed for allergies.    [provider]  Multiple Vitamins-Minerals (ALIVE ONCE DAILY WOMENS PO) Take 1 tablet by mouth daily.    [provider]  naphazoline-pheniramine (NAPHCON-A) 0.025-0.3 % ophthalmic solution Place 1 drop into both eyes 4 (four) times daily as needed for eye irritation. 08/21/17   Nils Flack, Kaniah Rizzolo A, PA-C  predniSONE (DELTASONE) 20 MG tablet Take 60 mg daily x 2 days then 40 mg daily x 2 days then 20 mg daily x 2 days Patient not taking: Reported on 08/21/2017 08/19/17   Drenda Freeze, MD    Family  History Family History  Problem Relation Age of Onset  . Diabetes Father   . Diabetes Brother   . Hypertension Mother   . Hypertension Sister   . Vaginal cancer Maternal Grandmother   . Heart disease Maternal Grandfather   . Heart attack Maternal Grandfather   . Thyroid disease Daughter   . Allergies Daughter   . Allergies Sister     Social History Social History   Tobacco Use  . Smoking status: Former Smoker    Packs/day: 0.50    Years: 12.00    Pack years: 6.00    Types: Cigarettes    Last attempt to quit: 05/12/1980    Years since quitting: 37.3  . Smokeless tobacco: Never Used  Substance Use Topics  . Alcohol use: Yes    Alcohol/week: 0.0 oz    Comment: ocass  . Drug use: No     Allergies   Patient has no known allergies.   Review of Systems Review of Systems  Constitutional: Negative for chills and fever.  HENT: Positive for congestion and sore throat. Negative for sinus pressure and sinus pain.   Respiratory: Positive for cough and shortness of breath.   Cardiovascular: Negative for chest pain.  Gastrointestinal: Negative for abdominal pain, nausea and vomiting.     Physical Exam Updated Vital Signs BP 126/75 (BP Location: Right Arm)   Pulse 71   Temp 98 F (36.7 C) (Oral)   Resp 16   LMP 05/12/1982   SpO2 98%   Physical Exam  Constitutional: She appears well-developed and well-nourished. No distress.  HENT:  Head: Normocephalic and atraumatic.  Right Ear: External ear and ear canal normal. A middle ear effusion is present.  Left Ear: External ear and ear canal normal. A middle ear effusion is present.  Nose: Mucosal edema present. No septal deviation or nasal septal hematoma. Right sinus exhibits no maxillary sinus tenderness and no frontal sinus tenderness. Left sinus exhibits no maxillary sinus tenderness and no frontal sinus tenderness.  Mouth/Throat: Uvula is midline, oropharynx is clear and moist and mucous membranes are normal. No trismus  in the jaw. No oropharyngeal exudate, posterior oropharyngeal edema, posterior oropharyngeal erythema or tonsillar abscesses. No tonsillar exudate.  Postnasal drip noted.  Pale pink boggy mucosa.  Eyes: Pupils are equal, round, and reactive to light. Conjunctivae and EOM are normal. Right eye exhibits no discharge. Left eye exhibits no discharge.  No conjunctival injection, no chemosis, proptosis, or consensual photophobia.  Slight glassy eyed appearance  Neck: Normal range of motion. Neck supple. No JVD present. No tracheal deviation present.  Cardiovascular: Normal rate, regular rhythm, normal heart sounds and intact distal pulses.  2+ radial and DP/PT pulses bl, negative Homan's bl   Pulmonary/Chest: Effort normal. She has wheezes. She exhibits no tenderness.  Equal rise and fall of chest, no increased work of breathing.  Speaking in full sentences without difficulty.  Scattered expiratory wheezing on auscultation of the lungs anteriorly and posteriorly.  Abdominal: Soft. Bowel sounds are normal. She exhibits no distension. There is no tenderness.  Musculoskeletal: She exhibits no edema.  Lymphadenopathy:    She has no cervical adenopathy.  Neurological: She is alert.  Skin: Skin is warm and dry. No erythema.  Psychiatric: She has a normal mood and affect. Her behavior is normal.  Nursing note and vitals reviewed.    ED Treatments / Results  Labs (all labs ordered are listed, but only abnormal results are displayed) Labs Reviewed - No data to display  EKG None  Radiology No results found.  Procedures Procedures (including critical care time)  Medications Ordered in ED Medications  ipratropium-albuterol (DUONEB) 0.5-2.5 (3) MG/3ML nebulizer solution 3 mL (3 mLs Nebulization Given 08/21/17 1655)     Initial Impression / Assessment and Plan / ED Course  I have reviewed the triage vital signs and the nursing notes.  Pertinent labs & imaging results that were available during  my care of the patient were reviewed by me and considered in my medical decision making (see chart for details).     Patient presents with complaint of ongoing nonproductive cough, nasal congestion, watery/itchy eyes for several days.  Recently treated with bronchitis in February with improvement/resolution of her symptoms.  She is afebrile, vital signs are stable today.  She is nontoxic and nonseptic in appearance.  She is tolerating secretions without difficulty.  She has mild scattered expiratory wheezing on auscultation of the lungs.  In the absence of fever or productive cough I doubt pneumonia.  She did have a chest x-ray 2 days ago which was showed no acute abnormalities.  I doubt PE or cardiac etiology of symptoms.  She states the symptoms occur yearly with seasonal changes and seem more consistent with allergic rhinitis/conjunctivitis with associated wheezing secondary to her asthma.  Her wheezing resolved entirely after breathing treatment.  I encouraged her to use her albuterol inhaler as prescribed every 6 hours as needed for shortness of breath.  I also encouraged her to use Benadryl or a nondrowsy antihistamine for her symptoms which she has not done for 3 days.  On reevaluation after the breathing treatment her wheezing has resolved, she is resting comfortably in no apparent distress and states she feels comfortable with discharge home.  She has no increased work of breathing whatsoever while in the ED and vital signs remain within normal limits.  We will also discharged with symptomatic treatment of her cough, nasal congestion, and eye itching.  Discussed strict ED return precautions.  She will follow-up with her primary care physician on an outpatient basis for reevaluation of her symptoms and further management of her asthma.  Pt verbalized understanding of and agreement with plan and is safe for discharge home at this time.  She has no complaints prior to discharge per  Final Clinical  Impressions(s) / ED Diagnoses   Final diagnoses:  Viral URI with cough  Wheezing    ED Discharge Orders  Ordered    benzonatate (TESSALON) 100 MG capsule  3 times daily PRN     08/21/17 1731    fluticasone (FLONASE) 50 MCG/ACT nasal spray  Daily     08/21/17 1731    naphazoline-pheniramine (NAPHCON-A) 0.025-0.3 % ophthalmic solution  4 times daily PRN     08/21/17 1731       Renita Papa, PA-C 08/21/17 Sharen Heck, MD 08/26/17 [phone]

## 2017-10-01 ENCOUNTER — Other Ambulatory Visit: Payer: Self-pay | Admitting: Family Medicine

## 2017-10-01 DIAGNOSIS — Z1231 Encounter for screening mammogram for malignant neoplasm of breast: Secondary | ICD-10-CM

## 2017-10-26 ENCOUNTER — Encounter: Payer: Self-pay | Admitting: Allergy and Immunology

## 2017-10-26 ENCOUNTER — Other Ambulatory Visit: Payer: Self-pay | Admitting: Allergy and Immunology

## 2017-10-26 ENCOUNTER — Ambulatory Visit (INDEPENDENT_AMBULATORY_CARE_PROVIDER_SITE_OTHER): Payer: Medicare Other | Admitting: Allergy and Immunology

## 2017-10-26 VITALS — BP 120/72 | HR 71 | Temp 98.1°F | Resp 18 | Ht 66.6 in | Wt 237.0 lb

## 2017-10-26 DIAGNOSIS — J3089 Other allergic rhinitis: Secondary | ICD-10-CM | POA: Insufficient documentation

## 2017-10-26 DIAGNOSIS — H1013 Acute atopic conjunctivitis, bilateral: Secondary | ICD-10-CM | POA: Diagnosis not present

## 2017-10-26 DIAGNOSIS — J4520 Mild intermittent asthma, uncomplicated: Secondary | ICD-10-CM | POA: Insufficient documentation

## 2017-10-26 DIAGNOSIS — H1010 Acute atopic conjunctivitis, unspecified eye: Secondary | ICD-10-CM | POA: Insufficient documentation

## 2017-10-26 MED ORDER — MONTELUKAST SODIUM 10 MG PO TABS: 10.0000 mg | ORAL_TABLET | Freq: Every day | ORAL | 5 refills | Status: DC

## 2017-10-26 MED ORDER — AZELASTINE HCL 0.15 % NA SOLN: 2.0000 | Freq: Two times a day (BID) | NASAL | 5 refills | Status: DC

## 2017-10-26 MED ORDER — CARBINOXAMINE MALEATE 4 MG PO TABS: 4.0000 mg | ORAL_TABLET | Freq: Three times a day (TID) | ORAL | 0 refills | Status: DC

## 2017-10-26 NOTE — Patient Instructions (Addendum)
Allergic rhinitis with a nonallergic component  Aeroallergen avoidance measures have been discussed and provided in written form.  A prescription has been provided for carbinoxamine, 4 mg every 8 hours if needed.  A prescription has been provided for azelastine nasal spray, 1-2 sprays per nostril 2 times daily as needed. Proper nasal spray technique has been discussed and demonstrated.   Nasal saline lavage (NeilMed) has been recommended as needed and prior to medicated nasal sprays along with instructions for proper administration.  Mild intermittent asthma The patient experiences coughing and wheezing which is relieved by albuterol.  Spirometry today revealed 160 mL postbronchodilator reversibility while asymptomatic.  A prescription has been provided for montelukast 10 mg daily at bedtime.  Continue albuterol HFA, 1 to 2 inhalations every 4-6 hours if needed.  Subjective and objective measures of pulmonary function will be followed and the treatment plan will be adjusted accordingly.   Return in about 4 months (around 02/25/2018), or if symptoms worsen or fail to improve.  Control of House Dust Mite Allergen  House dust mites play a major role in allergic asthma and rhinitis.  They occur in environments with high humidity wherever human skin, the food for dust mites is found. High levels have been detected in dust obtained from mattresses, pillows, carpets, upholstered furniture, bed covers, clothes and soft toys.  The principal allergen of the house dust mite is found in its feces.  A gram of dust may contain 1,000 mites and 250,000 fecal particles.  Mite antigen is easily measured in the air during house cleaning activities.    1. Encase mattresses, including the box spring, and pillow, in an air tight cover.  Seal the zipper end of the encased mattresses with wide adhesive tape. 2. Wash the bedding in water of 130 degrees Farenheit weekly.  Avoid cotton comforters/quilts and flannel  bedding: the most ideal bed covering is the dacron comforter. 3. Remove all upholstered furniture from the bedroom. 4. Remove carpets, carpet padding, rugs, and non-washable window drapes from the bedroom.  Wash drapes weekly or use plastic window coverings. 5. Remove all non-washable stuffed toys from the bedroom.  Wash stuffed toys weekly. 6. Have the room cleaned frequently with a vacuum cleaner and a damp dust-mop.  The patient should not be in a room which is being cleaned and should wait 1 hour after cleaning before going into the room. 7. Close and seal all heating outlets in the bedroom.  Otherwise, the room will become filled with dust-laden air.  An electric heater can be used to heat the room. 8. Reduce indoor humidity to less than 50%.  Do not use a humidifier.  Reducing Pollen Exposure  The American Academy of Allergy, Asthma and Immunology suggests the following steps to reduce your exposure to pollen during allergy seasons.    1. Do not hang sheets or clothing out to dry; pollen may collect on these items. 2. Do not mow lawns or spend time around freshly cut grass; mowing stirs up pollen. 3. Keep windows closed at night.  Keep car windows closed while driving. 4. Minimize morning activities outdoors, a time when pollen counts are usually at their highest. 5. Stay indoors as much as possible when pollen counts or humidity is high and on windy days when pollen tends to remain in the air longer. 6. Use air conditioning when possible.  Many air conditioners have filters that trap the pollen spores. 7. Use a HEPA room air filter to remove pollen form the  indoor air you breathe.

## 2017-10-26 NOTE — Telephone Encounter (Signed)
Dr. Verlin Fester alternatives are desloratadine or semprex-d. Please advise and thank you.

## 2017-10-26 NOTE — Telephone Encounter (Signed)
She has tried and failed these classes of medications. Please send in a PA. Thanks.

## 2017-10-26 NOTE — Assessment & Plan Note (Addendum)
The patient experiences coughing and wheezing which is relieved by albuterol.  Spirometry today revealed 160 mL postbronchodilator reversibility while asymptomatic.  A prescription has been provided for montelukast 10 mg daily at bedtime.  Continue albuterol HFA, 1 to 2 inhalations every 4-6 hours if needed.  Subjective and objective measures of pulmonary function will be followed and the treatment plan will be adjusted accordingly.

## 2017-10-26 NOTE — Assessment & Plan Note (Signed)
   Aeroallergen avoidance measures have been discussed and provided in written form.  A prescription has been provided for carbinoxamine, 4 mg every 8 hours if needed.  A prescription has been provided for azelastine nasal spray, 1-2 sprays per nostril 2 times daily as needed. Proper nasal spray technique has been discussed and demonstrated.   Nasal saline lavage (NeilMed) has been recommended as needed and prior to medicated nasal sprays along with instructions for proper administration.

## 2017-10-26 NOTE — Progress Notes (Signed)
New Patient Note  RE: Amy [name] MRN: [id number] DOB: [birth year] Date of Office Visit: 10/26/2017  Referring provider: Thurman Coyer, MD Primary care provider: Cloward, Dianna Rossetti, MD  Chief Complaint: Sinus Problem; Nasal Congestion; and Wheezing   History of present illness: Amy [name] is a 67 y.o. female seen today in consultation requested by Gwenlyn Perking, MD.  She reports that over the past 3 years she has experienced nasal congestion, thick postnasal drainage, and occasionally chest congestion and wheezing.  Approximately 3 years ago she went to the emergency department and was treated for coughing and wheezing with systemic steroids and albuterol.  She reports that the thick postnasal drainage is persistent.  Loratadine has failed to provide symptom relief.  Guaifenesin has provided mild/moderate relief, however the postnasal drainage has continued.  She states that the postnasal drainage is at its worst at bedtime and occasionally causes wheezing requiring albuterol rescue.  She notes that albuterol does relieve the wheezing and chest congestion temporarily.  She will also use the albuterol in the morning time with exercise or activity with benefit.  Her respiratory symptoms occur year around but tend to be more frequent and severe with pollen exposure.  Assessment and plan: Allergic rhinitis with a nonallergic component  Aeroallergen avoidance measures have been discussed and provided in written form.  A prescription has been provided for carbinoxamine, 4 mg every 8 hours if needed.  A prescription has been provided for azelastine nasal spray, 1-2 sprays per nostril 2 times daily as needed. Proper nasal spray technique has been discussed and demonstrated.   Nasal saline lavage (NeilMed) has been recommended as needed and prior to medicated nasal sprays along with instructions for proper administration.  Mild intermittent asthma The patient experiences coughing and  wheezing which is relieved by albuterol.  Spirometry today revealed 160 mL postbronchodilator reversibility while asymptomatic.  A prescription has been provided for montelukast 10 mg daily at bedtime.  Continue albuterol HFA, 1 to 2 inhalations every 4-6 hours if needed.  Subjective and objective measures of pulmonary function will be followed and the treatment plan will be adjusted accordingly.   Meds ordered this encounter  Medications  . Carbinoxamine Maleate 4 MG TABS    Sig: Take 1 tablet (4 mg total) by mouth every 8 (eight) hours.    Dispense:  60 each    Refill:  0  . Azelastine HCl 0.15 % SOLN    Sig: Place 2 sprays into both nostrils 2 (two) times daily.    Dispense:  30 mL    Refill:  5  . montelukast (SINGULAIR) 10 MG tablet    Sig: Take 1 tablet (10 mg total) by mouth at bedtime.    Dispense:  30 tablet    Refill:  5    Diagnostics: Spirometry: Spirometry reveals an FVC of 2.28 L and an FEV1 of 2.10 L with 160 mL postbronchodilator improvement.  This study was performed while the patient was asymptomatic.  Please see scanned spirometry results for details. Epicutaneous testing: Negative despite a positive histamine control. Intradermal testing: Borderline positive to grass pollen and dust mite antigen.    Physical examination: Blood pressure 120/72, pulse 71, temperature 98.1 F (36.7 C), temperature source Oral, resp. rate 18, height 5' 6.6" (1.692 m), weight 237 lb (107.5 kg), last menstrual period 05/12/1982, SpO2 95 %.  General: Alert, interactive, in no acute distress. HEENT: TMs pearly gray, turbinates moderately edematous without discharge, post-pharynx moderately erythematous. Neck: Supple without lymphadenopathy.  Lungs: Clear to auscultation without wheezing, rhonchi or rales. CV: Normal S1, S2 without murmurs. Abdomen: Nondistended, nontender. Skin: Warm and dry, without lesions or rashes. Extremities:  No clubbing, cyanosis or edema. Neuro:    Grossly intact.  Review of systems:  Review of systems negative except as noted in HPI / PMHx or noted below: Review of Systems  Constitutional: Negative.   HENT: Negative.   Eyes: Negative.   Respiratory: Negative.   Cardiovascular: Negative.   Gastrointestinal: Negative.   Genitourinary: Negative.   Musculoskeletal: Negative.   Skin: Negative.   Neurological: Negative.   Endo/Heme/Allergies: Negative.   Psychiatric/Behavioral: Negative.     Past medical history:  Past Medical History:  Diagnosis Date  . Colon polyp, hyperplastic 03/29/2010  . Diabetes mellitus   . History of kidney stones     Past surgical history:  Past Surgical History:  Procedure Laterality Date  . CYST EXCISION Left 15 years ago   Axillary   . ECTOPIC PREGNANCY SURGERY    . OVARIAN CYST REMOVAL  35 years ago  . TOTAL ABDOMINAL HYSTERECTOMY  1984    Family history: Family History  Problem Relation Age of Onset  . Diabetes Father   . Diabetes Brother   . Hypertension Mother   . Hypertension Sister   . Vaginal cancer Maternal Grandmother   . Heart disease Maternal Grandfather   . Heart attack Maternal Grandfather   . Thyroid disease Daughter   . Allergies Daughter   . Allergies Sister     Social history: Social History   Socioeconomic History  . Marital status: Married    Spouse name: Not on file  . Number of children: 2  . Years of education: Not on file  . Highest education level: Not on file  Occupational History    Employer: [employer]  Social Needs  . Financial resource strain: Not on file  . Food insecurity:    Worry: Not on file    Inability: Not on file  . Transportation needs:    Medical: Not on file    Non-medical: Not on file  Tobacco Use  . Smoking status: Former Smoker    Packs/day: 0.50    Years: 12.00    Pack years: 6.00    Types: Cigarettes    Last attempt to quit: 05/12/1980    Years since quitting: 37.4  . Smokeless tobacco: Never Used    Substance and Sexual Activity  . Alcohol use: Yes    Alcohol/week: 0.0 oz    Comment: ocass  . Drug use: No  . Sexual activity: Yes    Partners: Male    Birth control/protection: Surgical    Comment: Hysterectomy  Lifestyle  . Physical activity:    Days per week: Not on file    Minutes per session: Not on file  . Stress: Not on file  Relationships  . Social connections:    Talks on phone: Not on file    Gets together: Not on file    Attends religious service: Not on file    Active member of club or organization: Not on file    Attends meetings of clubs or organizations: Not on file    Relationship status: Not on file  . Intimate partner violence:    Fear of current or ex partner: Not on file    Emotionally abused: Not on file    Physically abused: Not on file    Forced sexual activity: Not on file  Other  Topics Concern  . Not on file  Social History Narrative  . Not on file   Environmental History: The patient lives in a 65 year old house with laminate floors throughout and central air/heat.  She is a former cigarette smoker having quit in 1980.  There are no pets in the home.  Allergies as of 10/26/2017   No Known Allergies     Medication List        Accurate as of 10/26/17 11:42 AM. Always use your most recent med list.          ALIVE ONCE DAILY WOMENS PO Take 1 tablet by mouth daily.   aspirin 81 MG chewable tablet Chew 81 mg by mouth daily.   Azelastine HCl 0.15 % Soln Place 2 sprays into both nostrils 2 (two) times daily.   Carbinoxamine Maleate 4 MG Tabs Take 1 tablet (4 mg total) by mouth every 8 (eight) hours.   cholecalciferol 1000 units tablet Commonly known as:  VITAMIN D Take 1,000 Units by mouth daily.   docusate sodium 100 MG capsule Commonly known as:  COLACE Take 100 mg by mouth daily.   loratadine 10 MG tablet Commonly known as:  CLARITIN Take 10 mg by mouth daily as needed for allergies.   metFORMIN 500 MG 24 hr tablet Commonly  known as:  GLUCOPHAGE-XR Take 500 mg by mouth daily with breakfast.   montelukast 10 MG tablet Commonly known as:  SINGULAIR Take 1 tablet (10 mg total) by mouth at bedtime.   PROAIR HFA 108 (90 Base) MCG/ACT inhaler Generic drug:  albuterol INHALE 1 TO 2 PUFFS BY MOUTH EVERY 4 TO 6 HOURS AS NEEDED FOR SHORTNESS OF BREATH/WHEEZING       Known medication allergies: No Known Allergies  I appreciate the opportunity to take part in Hidaya's care. Please do not hesitate to contact me with questions.  Sincerely,   R. Edgar Frisk, MD

## 2017-10-27 NOTE — Telephone Encounter (Signed)
Noted. Thanks.

## 2017-10-27 NOTE — Telephone Encounter (Signed)
[id number] Approved per insurance. Pharmacy is aware as is patient.

## 2017-10-27 NOTE — Telephone Encounter (Signed)
The patient called inquiring about the medication at the pharmacy and I let her know that a prior auth was needing to be done. She understands.

## 2017-10-27 NOTE — Telephone Encounter (Signed)
Submitted PA via [email or web address]

## 2017-10-28 ENCOUNTER — Ambulatory Visit
Admission: RE | Admit: 2017-10-28 | Discharge: 2017-10-28 | Disposition: A | Discharge status: Home / Self Care | Payer: Medicare Other | Source: Ambulatory Visit | Attending: Family Medicine | Admitting: Family Medicine

## 2017-10-28 DIAGNOSIS — Z1231 Encounter for screening mammogram for malignant neoplasm of breast: Secondary | ICD-10-CM

## 2017-10-28 HISTORY — DX: Mild intermittent asthma, uncomplicated: J45.20

## 2017-10-29 ENCOUNTER — Other Ambulatory Visit: Payer: Self-pay | Admitting: Internal Medicine

## 2017-10-29 DIAGNOSIS — R5381 Other malaise: Secondary | ICD-10-CM

## 2017-11-04 ENCOUNTER — Other Ambulatory Visit: Payer: Self-pay | Admitting: Internal Medicine

## 2017-11-04 DIAGNOSIS — E2839 Other primary ovarian failure: Secondary | ICD-10-CM

## 2017-12-18 ENCOUNTER — Ambulatory Visit
Admission: RE | Admit: 2017-12-18 | Discharge: 2017-12-18 | Disposition: A | Discharge status: Home / Self Care | Payer: Medicare Other | Source: Ambulatory Visit | Attending: Internal Medicine | Admitting: Internal Medicine

## 2017-12-18 DIAGNOSIS — E2839 Other primary ovarian failure: Secondary | ICD-10-CM

## 2018-03-29 ENCOUNTER — Encounter (HOSPITAL_COMMUNITY): Payer: Self-pay | Admitting: Emergency Medicine

## 2018-03-29 ENCOUNTER — Emergency Department (HOSPITAL_COMMUNITY)
Admission: EM | Admit: 2018-03-29 | Discharge: 2018-03-29 | Disposition: A | Discharge status: Home / Self Care | Payer: Medicare Other | Attending: Emergency Medicine | Admitting: Emergency Medicine

## 2018-03-29 ENCOUNTER — Other Ambulatory Visit: Payer: Self-pay

## 2018-03-29 ENCOUNTER — Emergency Department (HOSPITAL_COMMUNITY): Payer: Medicare Other

## 2018-03-29 DIAGNOSIS — Z79899 Other long term (current) drug therapy: Secondary | ICD-10-CM | POA: Diagnosis not present

## 2018-03-29 DIAGNOSIS — R0600 Dyspnea, unspecified: Secondary | ICD-10-CM | POA: Insufficient documentation

## 2018-03-29 DIAGNOSIS — R079 Chest pain, unspecified: Secondary | ICD-10-CM

## 2018-03-29 DIAGNOSIS — Z7982 Long term (current) use of aspirin: Secondary | ICD-10-CM | POA: Insufficient documentation

## 2018-03-29 DIAGNOSIS — E119 Type 2 diabetes mellitus without complications: Secondary | ICD-10-CM | POA: Diagnosis not present

## 2018-03-29 DIAGNOSIS — Z7984 Long term (current) use of oral hypoglycemic drugs: Secondary | ICD-10-CM | POA: Insufficient documentation

## 2018-03-29 DIAGNOSIS — R05 Cough: Secondary | ICD-10-CM | POA: Diagnosis not present

## 2018-03-29 DIAGNOSIS — Z87891 Personal history of nicotine dependence: Secondary | ICD-10-CM | POA: Insufficient documentation

## 2018-03-29 DIAGNOSIS — J45909 Unspecified asthma, uncomplicated: Secondary | ICD-10-CM | POA: Insufficient documentation

## 2018-03-29 LAB — BASIC METABOLIC PANEL
Anion gap: 11 (ref 5–15)
BUN: 10 mg/dL (ref 8–23)
CO2: 24 mmol/L (ref 22–32)
Calcium: 9.3 mg/dL (ref 8.9–10.3)
Chloride: 105 mmol/L (ref 98–111)
Creatinine, Ser: 0.56 mg/dL (ref 0.44–1.00)
GFR calc Af Amer: >60 mL/min (ref >60)
GFR calc non Af Amer: >60 mL/min (ref >60)
Glucose, Bld: 187 mg/dL — ABNORMAL HIGH (ref 70–99)
Potassium: 3.8 mmol/L (ref 3.5–5.1)
Sodium: 140 mmol/L (ref 135–145)

## 2018-03-29 LAB — I-STAT TROPONIN, ED
Troponin i, poc: 0 ng/mL (ref 0.00–0.08)
Troponin i, poc: 0 ng/mL (ref 0.00–0.08)

## 2018-03-29 LAB — CBC
HCT: 41.3 % (ref 36.0–46.0)
Hemoglobin: 13.4 g/dL (ref 12.0–15.0)
MCH: 29.2 pg (ref 26.0–34.0)
MCHC: 32.4 g/dL (ref 30.0–36.0)
MCV: 90 fL (ref 80.0–100.0)
Platelets: 265 10*3/uL (ref 150–400)
RBC: 4.59 MIL/uL (ref 3.87–5.11)
RDW: 12.7 % (ref 11.5–15.5)
WBC: 6.6 10*3/uL (ref 4.0–10.5)
nRBC: 0 % (ref 0.0–0.2)

## 2018-03-29 MED ORDER — ASPIRIN 81 MG PO CHEW
324.0000 mg | CHEWABLE_TABLET | Freq: Once | ORAL | Status: AC
  Administered 2018-03-29: 324 mg via ORAL
  Filled 2018-03-29: qty 4

## 2018-03-29 MED ORDER — PANTOPRAZOLE SODIUM 20 MG PO TBEC: 20.0000 mg | DELAYED_RELEASE_TABLET | Freq: Every day | ORAL | 0 refills | Status: DC

## 2018-03-29 NOTE — ED Notes (Signed)
Discharge instructions reviewed with patient. Patient verbalizes understanding. VSS.   

## 2018-03-29 NOTE — ED Provider Notes (Signed)
[facility] DEPT Provider Note   CSN: [id number] Arrival date & time: 03/29/18  7371     History   Chief Complaint Chief Complaint  Patient presents with  . Chest Pain    HPI Amy [name] is a 67 y.o. female.  HPI  67 year old female presents with chest pain.  Occurred to her once last week, then had no symptoms until yesterday.  Last night she noticed a sharp pain under her left breast.  It did not radiate.  The pain occurs quickly and lasts only seconds.  Has happened a couple times, including in the middle of the night and then again this morning.  Sometimes she will also have a concurrent pain in her back but it does not feel like the chest pain is radiating.  There is no radiation of the pain to her shoulders.  She has chronic dyspnea and chronic cough from her asthma, but has not had any new or worsening symptoms.  No leg swelling or calf pain.  Currently is not having any symptoms.  She has not been able to relate the onset or recurrence of the pain with any specific cause.  No exertional symptoms.  Past Medical History:  Diagnosis Date  . Colon polyp, hyperplastic 03/29/2010  . Diabetes mellitus   . History of kidney stones   . Mild intermittent asthma     Patient Active Problem List   Diagnosis Date Noted  . Allergic rhinitis with a nonallergic component 10/26/2017  . Mild intermittent asthma 10/26/2017  . Allergic conjunctivitis 10/26/2017  . Cough 04/27/2014  . Diabetes ([place]) 08/10/2013  . History of kidney stones   . Colon polyp, hyperplastic 03/29/2010    Past Surgical History:  Procedure Laterality Date  . CYST EXCISION Left 15 years ago   Axillary   . ECTOPIC PREGNANCY SURGERY    . OVARIAN CYST REMOVAL  35 years ago  . TOTAL ABDOMINAL HYSTERECTOMY  1984     OB History    Gravida  3   Para  2   Term  2   Preterm      AB  1   Living  2     SAB  1   TAB      Ectopic      Multiple      Live Births              Home Medications    Prior to Admission medications   Medication Sig Start Date End Date Taking? Authorizing Provider  albuterol (PROAIR HFA) 108 (90 Base) MCG/ACT inhaler Inhale 1-2 puffs into the lungs every 4 (four) hours as needed for wheezing or shortness of breath.  08/26/17  Yes [provider]  aspirin 81 MG chewable tablet Chew 81 mg by mouth daily.   Yes [provider]  Azelastine HCl 0.15 % SOLN Place 2 sprays into both nostrils 2 (two) times daily. 10/26/17  Yes Bobbitt, Sedalia Muta, MD  cholecalciferol (VITAMIN D) 1000 units tablet Take 1,000 Units by mouth daily.   Yes [provider]  docusate sodium (COLACE) 100 MG capsule Take 100 mg by mouth daily.   Yes [provider]  loratadine (CLARITIN) 10 MG tablet Take 10 mg by mouth daily as needed for allergies.   Yes [provider]  metFORMIN (GLUCOPHAGE-XR) 500 MG 24 hr tablet Take 500 mg by mouth daily with breakfast.   Yes [provider]  montelukast (SINGULAIR) 10 MG tablet Take  1 tablet (10 mg total) by mouth at bedtime. 10/26/17  Yes Bobbitt, Sedalia Muta, MD  Multiple Vitamins-Minerals (ALIVE ONCE DAILY WOMENS PO) Take 1 tablet by mouth daily.   Yes [provider]  Carbinoxamine Maleate 4 MG TABS Take 1 tablet (4 mg total) by mouth every 8 (eight) hours. Patient not taking: Reported on 03/29/2018 10/26/17   Bobbitt, Sedalia Muta, MD  pantoprazole (PROTONIX) 20 MG tablet Take 1 tablet (20 mg total) by mouth daily. 03/29/18   Sherwood Gambler, MD    Family History Family History  Problem Relation Age of Onset  . Diabetes Father   . Diabetes Brother   . Hypertension Mother   . Hypertension Sister   . Vaginal cancer Maternal Grandmother   . Heart disease Maternal Grandfather   . Heart attack Maternal Grandfather   . Thyroid disease Daughter   . Allergies Daughter   . Allergies Sister     Social History Social History   Tobacco Use  .  Smoking status: Former Smoker    Packs/day: 0.50    Years: 12.00    Pack years: 6.00    Types: Cigarettes    Last attempt to quit: 05/12/1980    Years since quitting: 37.9  . Smokeless tobacco: Never Used  Substance Use Topics  . Alcohol use: Yes    Alcohol/week: 0.0 standard drinks    Comment: ocass  . Drug use: No     Allergies   Patient has no known allergies.   Review of Systems Review of Systems  Respiratory: Positive for cough and shortness of breath.   Cardiovascular: Positive for chest pain. Negative for leg swelling.  Gastrointestinal: Negative for abdominal pain.  Musculoskeletal: Positive for back pain.  All other systems reviewed and are negative.    Physical Exam Updated Vital Signs BP 114/71   Pulse 61   Temp 97.8 F (36.6 C) (Oral)   Resp 17   Ht 5\' 6"  (1.676 m)   Wt 104.3 kg   LMP 05/12/1982   SpO2 97%   BMI 37.12 kg/m   Physical Exam  Constitutional: She appears well-developed and well-nourished.  Non-toxic appearance. She does not appear ill. No distress.  obese  HENT:  Head: Normocephalic and atraumatic.  Right Ear: External ear normal.  Left Ear: External ear normal.  Nose: Nose normal.  Eyes: Right eye exhibits no discharge. Left eye exhibits no discharge.  Cardiovascular: Normal rate, regular rhythm and normal heart sounds.  Pulmonary/Chest: Effort normal and breath sounds normal. She exhibits no tenderness.  Abdominal: Soft. There is no tenderness.  Musculoskeletal:       Right lower leg: She exhibits no tenderness and no edema.       Left lower leg: She exhibits no tenderness and no edema.  Neurological: She is alert.  Skin: Skin is warm and dry.  Psychiatric: Her mood appears not anxious.  Nursing note and vitals reviewed.    ED Treatments / Results  Labs (all labs ordered are listed, but only abnormal results are displayed) Labs Reviewed  BASIC METABOLIC PANEL - Abnormal; Notable for the following components:      Result  Value   Glucose, Bld 187 (*)    All other components within normal limits  CBC  I-STAT TROPONIN, ED  I-STAT TROPONIN, ED    EKG EKG Interpretation  Date/Time:  Monday March 29 2018 07:01:15 EST Ventricular Rate:  82 PR Interval:    QRS Duration: 101 QT Interval:  395 QTC Calculation:  462 R Axis:   63 Text Interpretation:  Sinus rhythm Multiple ventricular premature complexes Low voltage, precordial leads no significant change since Feb 2019 Confirmed by Sherwood Gambler [phone]) on 03/29/2018 7:13:37 AM   EKG Interpretation  Date/Time:  Monday March 29 2018 10:41:39 EST Ventricular Rate:  65 PR Interval:    QRS Duration: 105 QT Interval:  423 QTC Calculation: 440 R Axis:   58 Text Interpretation:  Sinus rhythm Low voltage, precordial leads PVCs no longer present compared to earlier in the day Confirmed by Sherwood Gambler [phone]) on 03/29/2018 11:34:34 AM        Radiology Dg Chest 2 View  Result Date: 03/29/2018 CLINICAL DATA:  Chest pain EXAM: CHEST - 2 VIEW COMPARISON:  August 19, 2017 FINDINGS: Lungs are clear. The heart size and pulmonary vascularity are normal. No adenopathy. No pneumothorax. There is mild degenerative change in the thoracic spine. IMPRESSION: No edema or consolidation. Electronically Signed   By: Lowella Grip III M.D.   On: 03/29/2018 07:42    Procedures Procedures (including critical care time)  Medications Ordered in ED Medications  aspirin chewable tablet 324 mg (324 mg Oral Given 03/29/18 0734)     Initial Impression / Assessment and Plan / ED Course  I have reviewed the triage vital signs and the nursing notes.  Pertinent labs & imaging results that were available during my care of the patient were reviewed by me and considered in my medical decision making (see chart for details).     Patient's CP is pretty atypical. Later, patient tells me she also has some indigestion like feeling in between the sharp pains. Abdominal exam  is benign.  I doubt ACS.  I also doubt PE or dissection.  No abdominal tenderness right upper quadrant tenderness to suggest gallbladder pathology.  I will prescribe her PPI and I think is reasonable to discharge her home given the very atypical nature of her chest pain and low suspicion that this is cardiac.  However we did discuss strict return precautions.  She already has follow-up in 2 days with her PCP.  Final Clinical Impressions(s) / ED Diagnoses   Final diagnoses:  Nonspecific chest pain    ED Discharge Orders         Ordered    pantoprazole (PROTONIX) 20 MG tablet  Daily     03/29/18 1135           Sherwood Gambler, MD 03/29/18 1144

## 2018-03-29 NOTE — Discharge Instructions (Signed)
While your labs do not show evidence of heart attack today, labs cannot rule out any heart disease at all.  It is very important to follow-up with your primary care physician.  If you develop recurrent or worsening chest pain, shortness of breath, fever, or any other new/concerning symptoms and return to the ER for evaluation.

## 2018-03-29 NOTE — ED Triage Notes (Signed)
Pt reports having chest pain that has been intermittent since Wednesday and worse this morning. Pt reports pain radiates from anterior chest to back and right shoulder.

## 2018-03-29 NOTE — ED Notes (Signed)
Bed: WA04 Expected date:  Expected time:  Means of arrival:  Comments: 

## 2018-05-17 DIAGNOSIS — Z0000 Encounter for general adult medical examination without abnormal findings: Secondary | ICD-10-CM | POA: Insufficient documentation

## 2018-05-17 DIAGNOSIS — Z23 Encounter for immunization: Secondary | ICD-10-CM | POA: Insufficient documentation

## 2019-01-25 ENCOUNTER — Other Ambulatory Visit: Payer: Self-pay | Admitting: Family Medicine

## 2019-01-25 ENCOUNTER — Other Ambulatory Visit: Payer: Self-pay | Admitting: Internal Medicine

## 2019-01-25 ENCOUNTER — Other Ambulatory Visit: Payer: Self-pay

## 2019-01-25 DIAGNOSIS — Z1231 Encounter for screening mammogram for malignant neoplasm of breast: Secondary | ICD-10-CM

## 2019-03-01 ENCOUNTER — Other Ambulatory Visit: Payer: Self-pay | Admitting: Internal Medicine

## 2019-03-01 DIAGNOSIS — Z1231 Encounter for screening mammogram for malignant neoplasm of breast: Secondary | ICD-10-CM

## 2019-04-08 ENCOUNTER — Other Ambulatory Visit: Payer: Self-pay

## 2019-04-08 ENCOUNTER — Emergency Department (HOSPITAL_COMMUNITY): Payer: Medicare Other

## 2019-04-08 ENCOUNTER — Encounter (HOSPITAL_COMMUNITY): Payer: Self-pay

## 2019-04-08 ENCOUNTER — Emergency Department (HOSPITAL_COMMUNITY)
Admission: EM | Admit: 2019-04-08 | Discharge: 2019-04-08 | Disposition: A | Discharge status: Home / Self Care | Payer: Medicare Other | Attending: Emergency Medicine | Admitting: Emergency Medicine

## 2019-04-08 DIAGNOSIS — Z7982 Long term (current) use of aspirin: Secondary | ICD-10-CM | POA: Insufficient documentation

## 2019-04-08 DIAGNOSIS — R202 Paresthesia of skin: Secondary | ICD-10-CM

## 2019-04-08 DIAGNOSIS — Z79899 Other long term (current) drug therapy: Secondary | ICD-10-CM | POA: Insufficient documentation

## 2019-04-08 DIAGNOSIS — M25512 Pain in left shoulder: Secondary | ICD-10-CM | POA: Diagnosis present

## 2019-04-08 DIAGNOSIS — Z7984 Long term (current) use of oral hypoglycemic drugs: Secondary | ICD-10-CM | POA: Diagnosis not present

## 2019-04-08 DIAGNOSIS — J45909 Unspecified asthma, uncomplicated: Secondary | ICD-10-CM | POA: Insufficient documentation

## 2019-04-08 DIAGNOSIS — E119 Type 2 diabetes mellitus without complications: Secondary | ICD-10-CM | POA: Insufficient documentation

## 2019-04-08 DIAGNOSIS — Z87891 Personal history of nicotine dependence: Secondary | ICD-10-CM | POA: Insufficient documentation

## 2019-04-08 LAB — CBC
HCT: 40 % (ref 36.0–46.0)
Hemoglobin: 13.1 g/dL (ref 12.0–15.0)
MCH: 29 pg (ref 26.0–34.0)
MCHC: 32.8 g/dL (ref 30.0–36.0)
MCV: 88.7 fL (ref 80.0–100.0)
Platelets: 267 10*3/uL (ref 150–400)
RBC: 4.51 MIL/uL (ref 3.87–5.11)
RDW: 12.5 % (ref 11.5–15.5)
WBC: 5.5 10*3/uL (ref 4.0–10.5)
nRBC: 0 % (ref 0.0–0.2)

## 2019-04-08 LAB — BASIC METABOLIC PANEL
Anion gap: 8 (ref 5–15)
BUN: 8 mg/dL (ref 8–23)
CO2: 25 mmol/L (ref 22–32)
Calcium: 9.1 mg/dL (ref 8.9–10.3)
Chloride: 106 mmol/L (ref 98–111)
Creatinine, Ser: 0.52 mg/dL (ref 0.44–1.00)
GFR calc Af Amer: >60 mL/min (ref >60)
GFR calc non Af Amer: >60 mL/min (ref >60)
Glucose, Bld: 176 mg/dL — ABNORMAL HIGH (ref 70–99)
Potassium: 3.7 mmol/L (ref 3.5–5.1)
Sodium: 139 mmol/L (ref 135–145)

## 2019-04-08 LAB — TROPONIN I (HIGH SENSITIVITY): Troponin I (High Sensitivity): <2 ng/L (ref <18)

## 2019-04-08 MED ORDER — SODIUM CHLORIDE 0.9% FLUSH: 3.0000 mL | Freq: Once | INTRAVENOUS | Status: DC

## 2019-04-08 NOTE — ED Triage Notes (Signed)
Pt presents with c/o chest pain and left arm numbness that started yesterday. Pt also c/o nausea. Pt reports she had the same symptoms approx one month ago. Pt reports associated dizziness with her symptoms.

## 2019-04-08 NOTE — ED Provider Notes (Signed)
[facility] DEPT Provider Note   CSN: [id number] Arrival date & time: 04/08/19  [id number]     History   Chief Complaint Chief Complaint  Patient presents with  . Chest Pain  . Numbness    HPI Amy [name] is a 68 y.o. female.  She is complaining of a months worth of on and off discomfort in her left upper chest, pulling sensation that goes into her shoulder.  She is also noticed some pins-and-needles in her left hand going into her forearm.  These are intermittent.  She has some chronic back issues and those have been bothering her after lifting a heavy pot.  She said she is felt on and off dizzy lightheaded.  She has been using over-the-counter medicine for her back along with some Sudafed for head congestion.  Her back doctor also put her on a muscle relaxant.     The history is provided by the patient.  Chest Pain Pain location:  L chest Pain quality comment:  Pulling Pain radiates to:  L shoulder Pain severity:  Mild Onset quality:  Gradual Timing:  Intermittent Progression:  Unchanged Chronicity:  New Relieved by:  None tried Worsened by:  Certain positions Ineffective treatments:  Aspirin Associated symptoms: back pain, dizziness and numbness (tingling)   Associated symptoms: no abdominal pain, no altered mental status, no cough, no diaphoresis, no fever, no nausea, no shortness of breath and no vomiting   Risk factors: diabetes mellitus     Past Medical History:  Diagnosis Date  . Colon polyp, hyperplastic 03/29/2010  . Diabetes mellitus   . History of kidney stones   . Mild intermittent asthma     Patient Active Problem List   Diagnosis Date Noted  . Allergic rhinitis with a nonallergic component 10/26/2017  . Mild intermittent asthma 10/26/2017  . Allergic conjunctivitis 10/26/2017  . Cough 04/27/2014  . Diabetes ([place]) 08/10/2013  . History of kidney stones   . Colon polyp, hyperplastic 03/29/2010    Past Surgical  History:  Procedure Laterality Date  . CYST EXCISION Left 15 years ago   Axillary   . ECTOPIC PREGNANCY SURGERY    . OVARIAN CYST REMOVAL  35 years ago  . TOTAL ABDOMINAL HYSTERECTOMY  1984     OB History    Gravida  3   Para  2   Term  2   Preterm      AB  1   Living  2     SAB  1   TAB      Ectopic      Multiple      Live Births               Home Medications    Prior to Admission medications   Medication Sig Start Date End Date Taking? Authorizing Provider  albuterol (PROAIR HFA) 108 (90 Base) MCG/ACT inhaler Inhale 1-2 puffs into the lungs every 4 (four) hours as needed for wheezing or shortness of breath.  08/26/17   [provider]  aspirin 81 MG chewable tablet Chew 81 mg by mouth daily.    [provider]  Azelastine HCl 0.15 % SOLN Place 2 sprays into both nostrils 2 (two) times daily. 10/26/17   Bobbitt, Sedalia Muta, MD  Carbinoxamine Maleate 4 MG TABS Take 1 tablet (4 mg total) by mouth every 8 (eight) hours. Patient not taking: Reported on 03/29/2018 10/26/17   Bobbitt, Sedalia Muta, MD  cholecalciferol (VITAMIN D) 1000  units tablet Take 1,000 Units by mouth daily.    [provider]  docusate sodium (COLACE) 100 MG capsule Take 100 mg by mouth daily.    [provider]  loratadine (CLARITIN) 10 MG tablet Take 10 mg by mouth daily as needed for allergies.    [provider]  metFORMIN (GLUCOPHAGE-XR) 500 MG 24 hr tablet Take 500 mg by mouth daily with breakfast.    [provider]  montelukast (SINGULAIR) 10 MG tablet Take 1 tablet (10 mg total) by mouth at bedtime. 10/26/17   Bobbitt, Sedalia Muta, MD  Multiple Vitamins-Minerals (ALIVE ONCE DAILY WOMENS PO) Take 1 tablet by mouth daily.    [provider]  pantoprazole (PROTONIX) 20 MG tablet Take 1 tablet (20 mg total) by mouth daily. 03/29/18   Sherwood Gambler, MD    Family History Family History  Problem Relation Age of Onset  .  Diabetes Father   . Diabetes Brother   . Hypertension Mother   . Hypertension Sister   . Vaginal cancer Maternal Grandmother   . Heart disease Maternal Grandfather   . Heart attack Maternal Grandfather   . Thyroid disease Daughter   . Allergies Daughter   . Allergies Sister     Social History Social History   Tobacco Use  . Smoking status: Former Smoker    Packs/day: 0.50    Years: 12.00    Pack years: 6.00    Types: Cigarettes    Quit date: 05/12/1980    Years since quitting: 38.9  . Smokeless tobacco: Never Used  Substance Use Topics  . Alcohol use: Yes    Alcohol/week: 0.0 standard drinks    Comment: ocass  . Drug use: No     Allergies   Patient has no known allergies.   Review of Systems Review of Systems  Constitutional: Negative for diaphoresis and fever.  HENT: Negative for sore throat.   Eyes: Negative for visual disturbance.  Respiratory: Negative for cough and shortness of breath.   Cardiovascular: Positive for chest pain.  Gastrointestinal: Negative for abdominal pain, nausea and vomiting.  Genitourinary: Negative for dysuria.  Musculoskeletal: Positive for back pain.  Skin: Negative for rash.  Neurological: Positive for dizziness and numbness (tingling).     Physical Exam Updated Vital Signs BP 138/78 (BP Location: Left Arm)   Pulse 85   Temp 98.4 F (36.9 C) (Oral)   Resp 18   Ht 5\' 6"  (1.676 m)   Wt 105.2 kg   LMP 05/12/1982   SpO2 98%   BMI 37.45 kg/m   Physical Exam Vitals signs and nursing note reviewed.  Constitutional:      General: She is not in acute distress.    Appearance: She is well-developed.  HENT:     Head: Normocephalic and atraumatic.  Eyes:     Conjunctiva/sclera: Conjunctivae normal.  Neck:     Musculoskeletal: Neck supple.  Cardiovascular:     Rate and Rhythm: Normal rate and regular rhythm.     Heart sounds: Normal heart sounds. No murmur.  Pulmonary:     Effort: Pulmonary effort is normal. No respiratory  distress.     Breath sounds: Normal breath sounds.  Abdominal:     Palpations: Abdomen is soft.     Tenderness: There is no abdominal tenderness.  Musculoskeletal: Normal range of motion.     Right lower leg: She exhibits no tenderness. No edema.     Left lower leg: She exhibits no tenderness. No edema.  Skin:    General: Skin is warm and dry.     Capillary Refill: Capillary refill takes less than 2 seconds.  Neurological:     Mental Status: She is alert.     GCS: GCS eye subscore is 4. GCS verbal subscore is 5. GCS motor subscore is 6.     Cranial Nerves: Cranial nerves are intact.     Sensory: Sensation is intact.     Motor: Motor function is intact.     Gait: Gait is intact.      ED Treatments / Results  Labs (all labs ordered are listed, but only abnormal results are displayed) Labs Reviewed  BASIC METABOLIC PANEL - Abnormal; Notable for the following components:      Result Value   Glucose, Bld 176 (*)    All other components within normal limits  CBC  TROPONIN I (HIGH SENSITIVITY)    EKG EKG Interpretation  Date/Time:  Friday April 08 2019 09:12:31 EST Ventricular Rate:  83 PR Interval:    QRS Duration: 111 QT Interval:  412 QTC Calculation: 485 R Axis:   56 Text Interpretation: Sinus rhythm Borderline prolonged PR interval Low voltage, precordial leads Borderline T abnormalities, anterior leads similar to prior 11/19 Confirmed by Aletta Edouard [phone]) on 04/08/2019 9:23:11 AM   Radiology Dg Chest 2 View  Result Date: 04/08/2019 CLINICAL DATA:  Chest pain and LEFT arm numbness since yesterday, nausea, dizziness EXAM: CHEST - 2 VIEW COMPARISON:  03/29/2018 FINDINGS: Normal heart size, mediastinal contours, and pulmonary vascularity. Atherosclerotic calcification aorta. Lungs clear. No pulmonary infiltrate, pleural effusion or pneumothorax. Scattered endplate spur formation thoracic spine. IMPRESSION: No acute abnormalities. Aortic atherosclerosis.  Electronically Signed   By: Lavonia Dana M.D.   On: 04/08/2019 09:42   Ct Head Wo Contrast  Result Date: 04/08/2019 CLINICAL DATA:  Subacute left arm numbness. Chest pain. Nausea. EXAM: CT HEAD WITHOUT CONTRAST TECHNIQUE: Contiguous axial images were obtained from the base of the skull through the vertex without intravenous contrast. COMPARISON:  None. FINDINGS: Brain: The brainstem, cerebellum, cerebral peduncles, thalami, basal ganglia, basilar cisterns, and ventricular system appear within normal limits. No intracranial hemorrhage, mass lesion, or acute CVA. Vascular: There is atherosclerotic calcification of the cavernous carotid arteries bilaterally. Skull: Unremarkable Sinuses/Orbits: Unremarkable Other: No supplemental non-categorized findings. IMPRESSION: 1. No significant intracranial abnormality is observed to explain the patient's symptoms. 2. Atherosclerosis. Electronically Signed   By: Van Clines M.D.   On: 04/08/2019 10:23    Procedures Procedures (including critical care time)  Medications Ordered in ED Medications - No data to display   Initial Impression / Assessment and Plan / ED Course  I have reviewed the triage vital signs and the nursing notes.  Pertinent labs & imaging results that were available during my care of the patient were reviewed by me and considered in my medical decision making (see chart for details).  Clinical Course as of Apr 08 1803  Fri Apr 08, 2039  2377 68 year old female here with intermittent left upper chest into shoulder pulling sensation along with some tingling in her left hand.  She is concerned she is having a stroke.  Nonfocal neuro exam.  Differential includes musculoskeletal, ACS, pneumonia, arthritis, TIA, stroke, peripheral neuropathy.   [MB]  [id number] Reviewed patient's results with her including unremarkable head CT and normal troponin.   [MB]    Clinical Course User Index [MB] Hayden Rasmussen, MD        Final Clinical  Impressions(s) /  ED Diagnoses   Final diagnoses:  Acute pain of left shoulder  Paresthesias    ED Discharge Orders    None       Hayden Rasmussen, MD 04/08/19 1805

## 2019-04-08 NOTE — Discharge Instructions (Addendum)
You were seen in the emergency department for some left shoulder and chest pulling sensation along with some tingling in your left arm.  You had a CAT scan of your head along with an EKG and lab work that did not show any serious findings.  Please contact your primary care doctor for close follow-up.  Return to the emergency department if any worsening or concerning symptoms.

## 2019-04-21 ENCOUNTER — Other Ambulatory Visit: Payer: Self-pay

## 2019-04-21 ENCOUNTER — Ambulatory Visit
Admission: RE | Admit: 2019-04-21 | Discharge: 2019-04-21 | Disposition: A | Discharge status: Home / Self Care | Payer: Medicare Other | Source: Ambulatory Visit | Attending: Internal Medicine | Admitting: Internal Medicine

## 2019-04-21 DIAGNOSIS — Z1231 Encounter for screening mammogram for malignant neoplasm of breast: Secondary | ICD-10-CM

## 2019-04-27 DIAGNOSIS — R9431 Abnormal electrocardiogram [ECG] [EKG]: Secondary | ICD-10-CM | POA: Insufficient documentation

## 2019-05-12 ENCOUNTER — Ambulatory Visit: Payer: Medicare Other | Attending: Internal Medicine

## 2019-05-12 DIAGNOSIS — Z20822 Contact with and (suspected) exposure to covid-19: Secondary | ICD-10-CM

## 2019-05-14 ENCOUNTER — Encounter (HOSPITAL_COMMUNITY): Payer: Self-pay | Admitting: Emergency Medicine

## 2019-05-14 ENCOUNTER — Emergency Department (HOSPITAL_COMMUNITY)
Admission: EM | Admit: 2019-05-14 | Discharge: 2019-05-14 | Disposition: A | Discharge status: Home / Self Care | Payer: Medicare Other | Attending: Emergency Medicine | Admitting: Emergency Medicine

## 2019-05-14 ENCOUNTER — Other Ambulatory Visit: Payer: Self-pay

## 2019-05-14 DIAGNOSIS — Z87891 Personal history of nicotine dependence: Secondary | ICD-10-CM | POA: Insufficient documentation

## 2019-05-14 DIAGNOSIS — E119 Type 2 diabetes mellitus without complications: Secondary | ICD-10-CM | POA: Insufficient documentation

## 2019-05-14 DIAGNOSIS — U071 COVID-19: Secondary | ICD-10-CM | POA: Diagnosis present

## 2019-05-14 DIAGNOSIS — Z7982 Long term (current) use of aspirin: Secondary | ICD-10-CM | POA: Insufficient documentation

## 2019-05-14 DIAGNOSIS — Z79899 Other long term (current) drug therapy: Secondary | ICD-10-CM | POA: Insufficient documentation

## 2019-05-14 LAB — NOVEL CORONAVIRUS, NAA: SARS-CoV-2, NAA: DETECTED — AB

## 2019-05-14 NOTE — ED Provider Notes (Signed)
[facility] EMERGENCY DEPARTMENT Provider Note   CSN: [id number] Arrival date & time: 05/14/19  1031     History No chief complaint on file.   Amy [name] is a 69 y.o. female presenting with questions regarding Covid diagnosis.  Patient states 9 days ago she started to develop symptoms including congestion, weakness, and cough.  Patient states she was tested for coronavirus 3 days ago, results returned today and were positive.  Patient states she is feeling much better, continues to have slight tiredness, does not have any fevers.  Patient has questions regarding her quarantine duration and how long her husband should isolate.  He is symptom-free.  She has no complaints at this time.  HPI     Past Medical History:  Diagnosis Date  . Colon polyp, hyperplastic 03/29/2010  . Diabetes mellitus   . History of kidney stones   . Mild intermittent asthma     Patient Active Problem List   Diagnosis Date Noted  . Allergic rhinitis with a nonallergic component 10/26/2017  . Mild intermittent asthma 10/26/2017  . Allergic conjunctivitis 10/26/2017  . Cough 04/27/2014  . Diabetes ([place]) 08/10/2013  . History of kidney stones   . Colon polyp, hyperplastic 03/29/2010    Past Surgical History:  Procedure Laterality Date  . CYST EXCISION Left 15 years ago   Axillary   . ECTOPIC PREGNANCY SURGERY    . OVARIAN CYST REMOVAL  35 years ago  . TOTAL ABDOMINAL HYSTERECTOMY  1984     OB History    Gravida  3   Para  2   Term  2   Preterm      AB  1   Living  2     SAB  1   TAB      Ectopic      Multiple      Live Births              Family History  Problem Relation Age of Onset  . Diabetes Father   . Diabetes Brother   . Hypertension Mother   . Hypertension Sister   . Vaginal cancer Maternal Grandmother   . Heart disease Maternal Grandfather   . Heart attack Maternal Grandfather   . Thyroid disease Daughter   . Allergies Daughter   .  Allergies Sister     Social History   Tobacco Use  . Smoking status: Former Smoker    Packs/day: 0.50    Years: 12.00    Pack years: 6.00    Types: Cigarettes    Quit date: 05/12/1980    Years since quitting: 39.0  . Smokeless tobacco: Never Used  Substance Use Topics  . Alcohol use: Yes    Alcohol/week: 0.0 standard drinks    Comment: ocass  . Drug use: No    Home Medications Prior to Admission medications   Medication Sig Start Date End Date Taking? Authorizing Provider  albuterol (PROAIR HFA) 108 (90 Base) MCG/ACT inhaler Inhale 1-2 puffs into the lungs every 4 (four) hours as needed for wheezing or shortness of breath.  08/26/17   [provider]  aspirin 500 MG EC tablet Take 500-1,000 mg by mouth every 6 (six) hours as needed for pain.    [provider]  Azelastine HCl 0.15 % SOLN Place 2 sprays into both nostrils 2 (two) times daily. Patient not taking: Reported on 04/08/2019 10/26/17   Bobbitt, Sedalia Muta, MD  Carbinoxamine Maleate 4 MG TABS Take  1 tablet (4 mg total) by mouth every 8 (eight) hours. Patient not taking: Reported on 03/29/2018 10/26/17   Bobbitt, Sedalia Muta, MD  cyclobenzaprine (FLEXERIL) 10 MG tablet Take 10 mg by mouth 3 (three) times daily as needed for muscle spasms. 03/24/19   [provider]  docusate sodium (COLACE) 100 MG capsule Take 100 mg by mouth daily as needed for mild constipation.     [provider]  metFORMIN (GLUCOPHAGE) 1000 MG tablet Take 1,000 mg by mouth every morning. 01/14/19   [provider]  montelukast (SINGULAIR) 10 MG tablet Take 1 tablet (10 mg total) by mouth at bedtime. Patient not taking: Reported on 04/08/2019 10/26/17   Bobbitt, Sedalia Muta, MD  pantoprazole (PROTONIX) 20 MG tablet Take 1 tablet (20 mg total) by mouth daily. Patient not taking: Reported on 04/08/2019 03/29/18   Sherwood Gambler, MD  phenylephrine (SUDAFED PE) 10 MG TABS tablet Take 10 mg by mouth every 4 (four)  hours as needed (CONGESTION).    [provider]    Allergies    Patient has no known allergies.  Review of Systems   Review of Systems  Respiratory: Positive for cough (Resolving).   Neurological: Positive for weakness (Resolving).    Physical Exam Updated Vital Signs BP 130/81   Pulse 86   Temp 98.4 F (36.9 C)   Resp 15   LMP 05/12/1982   SpO2 99%   Physical Exam Vitals and nursing note reviewed.  Constitutional:      General: She is not in acute distress.    Appearance: She is well-developed.     Comments: Sitting comfortably in the chair no acute distress  HENT:     Head: Normocephalic and atraumatic.  Cardiovascular:     Rate and Rhythm: Normal rate and regular rhythm.     Pulses: Normal pulses.  Pulmonary:     Effort: Pulmonary effort is normal.     Breath sounds: Normal breath sounds.     Comments: Speaking in full sentences.  Clear lung sounds in all fields.  Sats stable on room air.  No signs of respiratory distress. Abdominal:     General: There is no distension.  Musculoskeletal:        General: Normal range of motion.     Cervical back: Normal range of motion.  Skin:    General: Skin is warm.     Capillary Refill: Capillary refill takes less than 2 seconds.     Findings: No rash.  Neurological:     Mental Status: She is alert and oriented to person, place, and time.     ED Results / Procedures / Treatments   Labs (all labs ordered are listed, but only abnormal results are displayed) Labs Reviewed - No data to display  EKG None  Radiology No results found.  Procedures Procedures (including critical care time)  Medications Ordered in ED Medications - No data to display  ED Course  I have reviewed the triage vital signs and the nursing notes.  Pertinent labs & imaging results that were available during my care of the patient were reviewed by me and considered in my medical decision making (see chart for details).    MDM  Rules/Calculators/A&P                      Patient presenting with questions regarding her Covid diagnosis.  Physical exam reassuring, patient appears nontoxic.  She reports she is almost symptom-free.  She  has questions regarding how long she should isolate and how long her husband needs to quarantine.  I discussed current CDC guidelines regarding isolation and a positive test and how long to quarantine after an exposure.  As she has improving symptoms and no signs of respiratory distress, I do not believe she needs further pulmonary work-up at this time.  At this time, patient appears safe for discharge.  Return precautions given.  Patient states she understands and agrees to plan.  Amy [name] was evaluated in Emergency Department on 05/14/2019 for the symptoms described in the history of present illness. She was evaluated in the context of the global COVID-19 pandemic, which necessitated consideration that the patient might be at risk for infection with the SARS-CoV-2 virus that causes COVID-19. Institutional protocols and algorithms that pertain to the evaluation of patients at risk for COVID-19 are in a state of rapid change based on information released by regulatory bodies including the CDC and federal and state organizations. These policies and algorithms were followed during the patient's care in the ED.  Final Clinical Impression(s) / ED Diagnoses Final diagnoses:  [id number]    Rx / DC Orders ED Discharge Orders    None       Franchot Heidelberg, PA-C 05/14/19 1737    Pattricia Boss, MD 05/15/19 1513

## 2019-05-14 NOTE — Discharge Instructions (Addendum)
You should continue to quarantine for 10 days from when your symptoms first began (on the 24th). You should not be retested after your quarantine time is done. Your husband will start his quarantine today.  This is at 14 day period. If he is symptom free for 10 days, and his test is negative, he can end quarantine.  If his test is negative on Monday, he will still need to quarantine.  If he develops symptoms, he should be treated symptomatically.  Use Tylenol and ibuprofen as needed for pain or body aches or fever.  Use Mucinex only as needed for cough.  Make sure he is staying hydrated water. Return to the emergency room with significant difficulty breathing, persistent chest pain, any new, worsening, or concerning symptoms.

## 2019-05-14 NOTE — ED Triage Notes (Signed)
Pt states she had cold symptoms and was tested for COVID on Thursday.  Received + results today.  Denies any symptoms at present and is here to see what she needs to do and what her husband needs to do.

## 2019-05-14 NOTE — ED Notes (Signed)
Patient Alert and oriented to baseline. Stable and ambulatory to baseline. Patient verbalized understanding of the discharge instructions.  Patient belongings were taken by the patient.   

## 2019-05-17 ENCOUNTER — Emergency Department (HOSPITAL_COMMUNITY)
Admission: EM | Admit: 2019-05-17 | Discharge: 2019-05-17 | Disposition: A | Discharge status: Home / Self Care | Payer: Medicare Other | Attending: Emergency Medicine | Admitting: Emergency Medicine

## 2019-05-17 ENCOUNTER — Encounter (HOSPITAL_COMMUNITY): Payer: Self-pay | Admitting: *Deleted

## 2019-05-17 ENCOUNTER — Other Ambulatory Visit: Payer: Self-pay

## 2019-05-17 DIAGNOSIS — E119 Type 2 diabetes mellitus without complications: Secondary | ICD-10-CM | POA: Diagnosis not present

## 2019-05-17 DIAGNOSIS — U071 COVID-19: Secondary | ICD-10-CM | POA: Diagnosis not present

## 2019-05-17 DIAGNOSIS — Z7982 Long term (current) use of aspirin: Secondary | ICD-10-CM | POA: Diagnosis not present

## 2019-05-17 DIAGNOSIS — Z87891 Personal history of nicotine dependence: Secondary | ICD-10-CM | POA: Insufficient documentation

## 2019-05-17 DIAGNOSIS — Z79899 Other long term (current) drug therapy: Secondary | ICD-10-CM | POA: Diagnosis not present

## 2019-05-17 DIAGNOSIS — R0602 Shortness of breath: Secondary | ICD-10-CM | POA: Diagnosis present

## 2019-05-17 DIAGNOSIS — J4521 Mild intermittent asthma with (acute) exacerbation: Secondary | ICD-10-CM | POA: Diagnosis not present

## 2019-05-17 DIAGNOSIS — Z7984 Long term (current) use of oral hypoglycemic drugs: Secondary | ICD-10-CM | POA: Diagnosis not present

## 2019-05-17 MED ORDER — DEXAMETHASONE 4 MG PO TABS
10.0000 mg | ORAL_TABLET | Freq: Once | ORAL | Status: AC
  Administered 2019-05-17: 10 mg via ORAL
  Filled 2019-05-17: qty 2

## 2019-05-17 MED ORDER — ALBUTEROL SULFATE HFA 108 (90 BASE) MCG/ACT IN AERS
2.0000 | INHALATION_SPRAY | Freq: Once | RESPIRATORY_TRACT | Status: AC
  Administered 2019-05-17: 09:00:00 2 via RESPIRATORY_TRACT
  Filled 2019-05-17: qty 6.7

## 2019-05-17 NOTE — Discharge Instructions (Signed)
Please let your doctor know that you have tested positive for the coronavirus.  See them in the office when they are able to see you.  Please return for worsening shortness of breath.  Continue to self isolate.     Person Under Monitoring Name: Amy [name]  Location: [place] [address]   Infection Prevention Recommendations for Individuals Confirmed to have, or Being Evaluated for, 2019 Novel Coronavirus (COVID-19) Infection Who Receive Care at Home  Individuals who are confirmed to have, or are being evaluated for, COVID-19 should follow the prevention steps below until a healthcare provider or local or state health department says they can return to normal activities.  Stay home except to get medical care You should restrict activities outside your home, except for getting medical care. Do not go to work, school, or public areas, and do not use public transportation or taxis.  Call ahead before visiting your doctor Before your medical appointment, call the healthcare provider and tell them that you have, or are being evaluated for, COVID-19 infection. This will help the healthcare provider's office take steps to keep other people from getting infected. Ask your healthcare provider to call the local or state health department.  Monitor your symptoms Seek prompt medical attention if your illness is worsening (e.g., difficulty breathing). Before going to your medical appointment, call the healthcare provider and tell them that you have, or are being evaluated for, COVID-19 infection. Ask your healthcare provider to call the local or state health department.  Wear a facemask You should wear a facemask that covers your nose and mouth when you are in the same room with other people and when you visit a healthcare provider. People who live with or visit you should also wear a facemask while they are in the same room with you.  Separate yourself from other people  in your home As much as possible, you should stay in a different room from other people in your home. Also, you should use a separate bathroom, if available.  Avoid sharing household items You should not share dishes, drinking glasses, cups, eating utensils, towels, bedding, or other items with other people in your home. After using these items, you should wash them thoroughly with soap and water.  Cover your coughs and sneezes Cover your mouth and nose with a tissue when you cough or sneeze, or you can cough or sneeze into your sleeve. Throw used tissues in a lined trash can, and immediately wash your hands with soap and water for at least 20 seconds or use an alcohol-based hand rub.  Wash your [facility] your hands often and thoroughly with soap and water for at least 20 seconds. You can use an alcohol-based hand sanitizer if soap and water are not available and if your hands are not visibly dirty. Avoid touching your eyes, nose, and mouth with unwashed hands.   Prevention Steps for Caregivers and Household Members of Individuals Confirmed to have, or Being Evaluated for, COVID-19 Infection Being Cared for in the Home  If you live with, or provide care at home for, a person confirmed to have, or being evaluated for, COVID-19 infection please follow these guidelines to prevent infection:  Follow healthcare provider's instructions Make sure that you understand and can help the patient follow any healthcare provider instructions for all care.  Provide for the patient's basic needs You should help the patient with basic needs in the home and provide support for getting groceries, prescriptions,  and other personal needs.  Monitor the patient's symptoms If they are getting sicker, call his or her medical provider and tell them that the patient has, or is being evaluated for, COVID-19 infection. This will help the healthcare provider's office take steps to keep other people from getting  infected. Ask the healthcare provider to call the local or state health department.  Limit the number of people who have contact with the patient If possible, have only one caregiver for the patient. Other household members should stay in another home or place of residence. If this is not possible, they should stay in another room, or be separated from the patient as much as possible. Use a separate bathroom, if available. Restrict visitors who do not have an essential need to be in the home.  Keep older adults, very young children, and other sick people away from the patient Keep older adults, very young children, and those who have compromised immune systems or chronic health conditions away from the patient. This includes people with chronic heart, lung, or kidney conditions, diabetes, and cancer.  Ensure good ventilation Make sure that shared spaces in the home have good air flow, such as from an air conditioner or an opened window, weather permitting.  Wash your hands often Wash your hands often and thoroughly with soap and water for at least 20 seconds. You can use an alcohol based hand sanitizer if soap and water are not available and if your hands are not visibly dirty. Avoid touching your eyes, nose, and mouth with unwashed hands. Use disposable paper towels to dry your hands. If not available, use dedicated cloth towels and replace them when they become wet.  Wear a facemask and gloves Wear a disposable facemask at all times in the room and gloves when you touch or have contact with the patient's blood, body fluids, and/or secretions or excretions, such as sweat, saliva, sputum, nasal mucus, vomit, urine, or feces.  Ensure the mask fits over your nose and mouth tightly, and do not touch it during use. Throw out disposable facemasks and gloves after using them. Do not reuse. Wash your hands immediately after removing your facemask and gloves. If your personal clothing becomes  contaminated, carefully remove clothing and launder. Wash your hands after handling contaminated clothing. Place all used disposable facemasks, gloves, and other waste in a lined container before disposing them with other household waste. Remove gloves and wash your hands immediately after handling these items.  Do not share dishes, glasses, or other household items with the patient Avoid sharing household items. You should not share dishes, drinking glasses, cups, eating utensils, towels, bedding, or other items with a patient who is confirmed to have, or being evaluated for, COVID-19 infection. After the person uses these items, you should wash them thoroughly with soap and water.  Wash laundry thoroughly Immediately remove and wash clothes or bedding that have blood, body fluids, and/or secretions or excretions, such as sweat, saliva, sputum, nasal mucus, vomit, urine, or feces, on them. Wear gloves when handling laundry from the patient. Read and follow directions on labels of laundry or clothing items and detergent. In general, wash and dry with the warmest temperatures recommended on the label.  Clean all areas the individual has used often Clean all touchable surfaces, such as counters, tabletops, doorknobs, bathroom fixtures, toilets, phones, keyboards, tablets, and bedside tables, every day. Also, clean any surfaces that may have blood, body fluids, and/or secretions or excretions on them. Wear gloves when  cleaning surfaces the patient has come in contact with. Use a diluted bleach solution (e.g., dilute bleach with 1 part bleach and 10 parts water) or a household disinfectant with a label that says EPA-registered for coronaviruses. To make a bleach solution at home, add 1 tablespoon of bleach to 1 quart (4 cups) of water. For a larger supply, add  cup of bleach to 1 gallon (16 cups) of water. Read labels of cleaning products and follow recommendations provided on product labels. Labels  contain instructions for safe and effective use of the cleaning product including precautions you should take when applying the product, such as wearing gloves or eye protection and making sure you have good ventilation during use of the product. Remove gloves and wash hands immediately after cleaning.  Monitor yourself for signs and symptoms of illness Caregivers and household members are considered close contacts, should monitor their health, and will be asked to limit movement outside of the home to the extent possible. Follow the monitoring steps for close contacts listed on the symptom monitoring form.   ? If you have additional questions, contact your local health department or call the epidemiologist on call at [phone] (available 24/7). ? This guidance is subject to change. For the most up-to-date guidance from [facility], please refer to their website: [email or web address]

## 2019-05-17 NOTE — ED Triage Notes (Signed)
Pt tested + for Covid on Thursday 12/31. SHIOB stated, needs refill on inhaler, states she feels fine otherwise.

## 2019-05-17 NOTE — ED Provider Notes (Signed)
Amy [name]   CSN: [id number] Arrival date & time: 05/17/19  [id number]     History Chief Complaint  Patient presents with  . Shortness of Breath  . +Covid    Amy [name] is a 69 y.o. female.  69 yo F with a chief complaint of shortness of breath.  Patient has a history of reactive airway disease.  Has an inhaler at home but feels it has not been working for the past couple days.  She is also almost out of it and its an old prescription so she thinks it may not be effective.  She has been having a cough.  She denies any chest pain denies abdominal pain denies nausea or vomiting.  Had a fever couple days ago but no longer.   The history is provided by the patient.  Shortness of Breath Severity:  Mild Onset quality:  Gradual Duration:  2 weeks Timing:  Constant Progression:  Worsening Chronicity:  New Relieved by:  Nothing Worsened by:  Nothing Ineffective treatments:  None tried Associated symptoms: no chest pain, no fever, no headaches, no vomiting and no wheezing        Past Medical History:  Diagnosis Date  . Colon polyp, hyperplastic 03/29/2010  . Diabetes mellitus   . History of kidney stones   . Mild intermittent asthma     Patient Active Problem List   Diagnosis Date Noted  . Allergic rhinitis with a nonallergic component 10/26/2017  . Mild intermittent asthma 10/26/2017  . Allergic conjunctivitis 10/26/2017  . Cough 04/27/2014  . Diabetes ([place]) 08/10/2013  . History of kidney stones   . Colon polyp, hyperplastic 03/29/2010    Past Surgical History:  Procedure Laterality Date  . CYST EXCISION Left 15 years ago   Axillary   . ECTOPIC PREGNANCY SURGERY    . OVARIAN CYST REMOVAL  35 years ago  . TOTAL ABDOMINAL HYSTERECTOMY  1984     OB History    Gravida  3   Para  2   Term  2   Preterm      AB  1   Living  2     SAB  1   TAB      Ectopic      Multiple      Live Births              Family History  Problem Relation Age of Onset  . Diabetes Father   . Diabetes Brother   . Hypertension Mother   . Hypertension Sister   . Vaginal cancer Maternal Grandmother   . Heart disease Maternal Grandfather   . Heart attack Maternal Grandfather   . Thyroid disease Daughter   . Allergies Daughter   . Allergies Sister     Social History   Tobacco Use  . Smoking status: Former Smoker    Packs/day: 0.50    Years: 12.00    Pack years: 6.00    Types: Cigarettes    Quit date: 05/12/1980    Years since quitting: 39.0  . Smokeless tobacco: Never Used  Substance Use Topics  . Alcohol use: Yes    Alcohol/week: 0.0 standard drinks    Comment: ocass  . Drug use: No    Home Medications Prior to Admission medications   Medication Sig Start Date End Date Taking? Authorizing Provider  albuterol (PROAIR HFA) 108 (90 Base) MCG/ACT inhaler Inhale 1-2 puffs into the lungs every 4 (four) hours as  needed for wheezing or shortness of breath.  08/26/17   [provider]  aspirin 500 MG EC tablet Take 500-1,000 mg by mouth every 6 (six) hours as needed for pain.    [provider]  Azelastine HCl 0.15 % SOLN Place 2 sprays into both nostrils 2 (two) times daily. Patient not taking: Reported on 04/08/2019 10/26/17   Bobbitt, Sedalia Muta, MD  Carbinoxamine Maleate 4 MG TABS Take 1 tablet (4 mg total) by mouth every 8 (eight) hours. Patient not taking: Reported on 03/29/2018 10/26/17   Bobbitt, Sedalia Muta, MD  cyclobenzaprine (FLEXERIL) 10 MG tablet Take 10 mg by mouth 3 (three) times daily as needed for muscle spasms. 03/24/19   [provider]  docusate sodium (COLACE) 100 MG capsule Take 100 mg by mouth daily as needed for mild constipation.     [provider]  metFORMIN (GLUCOPHAGE) 1000 MG tablet Take 1,000 mg by mouth every morning. 01/14/19   [provider]  montelukast (SINGULAIR) 10 MG tablet Take 1 tablet (10 mg total) by mouth at  bedtime. Patient not taking: Reported on 04/08/2019 10/26/17   Bobbitt, Sedalia Muta, MD  pantoprazole (PROTONIX) 20 MG tablet Take 1 tablet (20 mg total) by mouth daily. Patient not taking: Reported on 04/08/2019 03/29/18   Sherwood Gambler, MD  phenylephrine (SUDAFED PE) 10 MG TABS tablet Take 10 mg by mouth every 4 (four) hours as needed (CONGESTION).    [provider]    Allergies    Patient has no known allergies.  Review of Systems   Review of Systems  Constitutional: Negative for chills and fever.  HENT: Negative for congestion and rhinorrhea.   Eyes: Negative for redness and visual disturbance.  Respiratory: Positive for shortness of breath. Negative for wheezing.   Cardiovascular: Negative for chest pain and palpitations.  Gastrointestinal: Negative for nausea and vomiting.  Genitourinary: Negative for dysuria and urgency.  Musculoskeletal: Negative for arthralgias and myalgias.  Skin: Negative for pallor and wound.  Neurological: Negative for dizziness and headaches.    Physical Exam Updated Vital Signs BP (!) 147/89 (BP Location: Right Arm)   Pulse 90   Temp 99.3 F (37.4 C) (Oral)   Resp 18   Ht 5\' 5"  (1.651 m)   Wt 103.9 kg   LMP 05/12/1982   SpO2 99%   BMI 38.11 kg/m   Physical Exam Vitals and nursing [name] reviewed.  Constitutional:      General: She is not in acute distress.    Appearance: She is well-developed. She is not diaphoretic.  HENT:     Head: Normocephalic and atraumatic.  Eyes:     Pupils: Pupils are equal, round, and reactive to light.  Cardiovascular:     Rate and Rhythm: Normal rate and regular rhythm.     Heart sounds: No murmur. No friction rub. No gallop.   Pulmonary:     Effort: Pulmonary effort is normal.     Breath sounds: No wheezing or rales.  Abdominal:     General: There is no distension.     Palpations: Abdomen is soft.     Tenderness: There is no abdominal tenderness.  Musculoskeletal:        General: No  tenderness.     Cervical back: Normal range of motion and neck supple.  Skin:    General: Skin is warm and dry.  Neurological:     Mental Status: She is alert and oriented to person, place, and time.  Psychiatric:  Behavior: Behavior normal.     ED Results / Procedures / Treatments   Labs (all labs ordered are listed, but only abnormal results are displayed) Labs Reviewed - No data to display  EKG None  Radiology No results found.  Procedures Procedures (including critical care time)  Medications Ordered in ED Medications  albuterol (VENTOLIN HFA) 108 (90 Base) MCG/ACT inhaler 2 puff (has no administration in time range)  dexamethasone (DECADRON) tablet 10 mg (has no administration in time range)    ED Course  I have reviewed the triage vital signs and the nursing notes.  Pertinent labs & imaging results that were available during my care of the patient were reviewed by me and considered in my medical decision making (see chart for details).    MDM Rules/Calculators/A&P                      69 yo F with a cc of sob.  Going on for the past couple days.  Using inhaler but not working.  Clear lungs for me.  Patient not requiring O2, recent + covid test.  Will d/c home, give 2 puffs albuterol.  Single dose decadron.   RONNIESHA WEBB was evaluated in Emergency Department on 05/17/2019 for the symptoms described in the history of present illness. He/she was evaluated in the context of the global COVID-19 pandemic, which necessitated consideration that the patient might be at risk for infection with the SARS-CoV-2 virus that causes COVID-19. Institutional protocols and algorithms that pertain to the evaluation of patients at risk for COVID-19 are in a state of rapid change based on information released by regulatory bodies including the CDC and federal and state organizations. These policies and algorithms were followed during the patient's care in the ED.  8:37 AM:  I have  discussed the diagnosis/risks/treatment options with the patient and believe the pt to be eligible for discharge home to follow-up with PCP. We also discussed returning to the ED immediately if new or worsening sx occur. We discussed the sx which are most concerning (e.g., sudden worsening fever, inability to tolerate by mouth) that necessitate immediate return. Medications administered to the patient during their visit and any new prescriptions provided to the patient are listed below.  Medications given during this visit Medications  albuterol (VENTOLIN HFA) 108 (90 Base) MCG/ACT inhaler 2 puff (has no administration in time range)  dexamethasone (DECADRON) tablet 10 mg (has no administration in time range)     The patient appears reasonably screen and/or stabilized for discharge and I doubt any other medical condition or other [facility] requiring further screening, evaluation, or treatment in the ED at this time prior to discharge.   Final Clinical Impression(s) / ED Diagnoses Final diagnoses:  Mild intermittent reactive airway disease with acute exacerbation  COVID-19 virus infection    Rx / DC Orders ED Discharge Orders    None       Deno Etienne, DO 05/17/19 [phone]

## 2019-07-12 ENCOUNTER — Other Ambulatory Visit: Payer: Self-pay

## 2019-07-12 ENCOUNTER — Emergency Department (HOSPITAL_COMMUNITY)
Admission: EM | Admit: 2019-07-12 | Discharge: 2019-07-12 | Disposition: A | Discharge status: Home / Self Care | Payer: Medicare HMO | Attending: Emergency Medicine | Admitting: Emergency Medicine

## 2019-07-12 ENCOUNTER — Encounter (HOSPITAL_COMMUNITY): Payer: Self-pay | Admitting: Family Medicine

## 2019-07-12 DIAGNOSIS — E119 Type 2 diabetes mellitus without complications: Secondary | ICD-10-CM | POA: Insufficient documentation

## 2019-07-12 DIAGNOSIS — J4541 Moderate persistent asthma with (acute) exacerbation: Secondary | ICD-10-CM | POA: Insufficient documentation

## 2019-07-12 DIAGNOSIS — Z7984 Long term (current) use of oral hypoglycemic drugs: Secondary | ICD-10-CM | POA: Diagnosis not present

## 2019-07-12 DIAGNOSIS — Z87891 Personal history of nicotine dependence: Secondary | ICD-10-CM | POA: Diagnosis not present

## 2019-07-12 DIAGNOSIS — Z79899 Other long term (current) drug therapy: Secondary | ICD-10-CM | POA: Insufficient documentation

## 2019-07-12 DIAGNOSIS — R062 Wheezing: Secondary | ICD-10-CM | POA: Diagnosis present

## 2019-07-12 MED ORDER — ALBUTEROL SULFATE HFA 108 (90 BASE) MCG/ACT IN AERS
2.0000 | INHALATION_SPRAY | Freq: Once | RESPIRATORY_TRACT | Status: AC
  Administered 2019-07-12: 2 via RESPIRATORY_TRACT
  Filled 2019-07-12: qty 6.7

## 2019-07-12 MED ORDER — PREDNISONE 10 MG PO TABS: 20.0000 mg | ORAL_TABLET | Freq: Two times a day (BID) | ORAL | 0 refills | Status: DC

## 2019-07-12 MED ORDER — PREDNISONE 20 MG PO TABS
40.0000 mg | ORAL_TABLET | Freq: Once | ORAL | Status: AC
  Administered 2019-07-12: 40 mg via ORAL
  Filled 2019-07-12: qty 2

## 2019-07-12 NOTE — ED Triage Notes (Signed)
Patient reports she is experiencing an asthma attack about 1.5 weeks ago. She had the 2nd COVID vaccine yesterday. She has been using ALBUTEROL inhaler with no relief and requesting steroids.

## 2019-07-12 NOTE — ED Provider Notes (Signed)
[facility] DEPT Provider Note   CSN: [id number] Arrival date & time: 07/12/19  1507     History Chief Complaint  Patient presents with  . Asthma    Amy [name] is a 69 y.o. female.  Patient is a 69 year old female with history of asthma, diabetes.  She presents today for evaluation of wheezing, shortness of breath, and cough.  This has worsened over the past week.  This is similar to prior flareups of her asthma.  She denies fevers, chills, chest pain.  She denies any Covid exposures.  She does report receiving her second dose of the Covid vaccine yesterday.  She has tolerated this well with minimal side effects.  Patient has been treated with steroids in the past and is requesting this now.  She is tried using her steroid inhaler with minimal relief.  Patient has no rescue albuterol inhaler.  The history is provided by the patient.  Asthma This is a recurrent problem. The current episode started more than 1 week ago. The problem occurs constantly. The problem has been gradually worsening. Associated symptoms include shortness of breath. Pertinent negatives include no chest pain. Nothing aggravates the symptoms. Nothing relieves the symptoms.       Past Medical History:  Diagnosis Date  . Colon polyp, hyperplastic 03/29/2010  . Diabetes mellitus   . History of kidney stones   . Mild intermittent asthma     Patient Active Problem List   Diagnosis Date Noted  . Allergic rhinitis with a nonallergic component 10/26/2017  . Mild intermittent asthma 10/26/2017  . Allergic conjunctivitis 10/26/2017  . Cough 04/27/2014  . Diabetes ([place]) 08/10/2013  . History of kidney stones   . Colon polyp, hyperplastic 03/29/2010    Past Surgical History:  Procedure Laterality Date  . CYST EXCISION Left 15 years ago   Axillary   . ECTOPIC PREGNANCY SURGERY    . OVARIAN CYST REMOVAL  35 years ago  . TOTAL ABDOMINAL HYSTERECTOMY  1984     OB History    Gravida  3   Para  2   Term  2   Preterm      AB  1   Living  2     SAB  1   TAB      Ectopic      Multiple      Live Births              Family History  Problem Relation Age of Onset  . Diabetes Father   . Diabetes Brother   . Hypertension Mother   . Hypertension Sister   . Vaginal cancer Maternal Grandmother   . Heart disease Maternal Grandfather   . Heart attack Maternal Grandfather   . Thyroid disease Daughter   . Allergies Daughter   . Allergies Sister     Social History   Tobacco Use  . Smoking status: Former Smoker    Packs/day: 0.50    Years: 12.00    Pack years: 6.00    Types: Cigarettes    Quit date: 05/12/1980    Years since quitting: 39.1  . Smokeless tobacco: Never Used  Substance Use Topics  . Alcohol use: Yes    Alcohol/week: 0.0 standard drinks    Comment: "Birthdays"  . Drug use: No    Home Medications Prior to Admission medications   Medication Sig Start Date End Date Taking? Authorizing Provider  albuterol (PROAIR HFA) 108 (90 Base) MCG/ACT inhaler  Inhale 1-2 puffs into the lungs every 4 (four) hours as needed for wheezing or shortness of breath.  08/26/17   [provider]  aspirin 500 MG EC tablet Take 500-1,000 mg by mouth every 6 (six) hours as needed for pain.    [provider]  Azelastine HCl 0.15 % SOLN Place 2 sprays into both nostrils 2 (two) times daily. Patient not taking: Reported on 04/08/2019 10/26/17   Bobbitt, Sedalia Muta, MD  Carbinoxamine Maleate 4 MG TABS Take 1 tablet (4 mg total) by mouth every 8 (eight) hours. Patient not taking: Reported on 03/29/2018 10/26/17   Bobbitt, Sedalia Muta, MD  cyclobenzaprine (FLEXERIL) 10 MG tablet Take 10 mg by mouth 3 (three) times daily as needed for muscle spasms. 03/24/19   [provider]  docusate sodium (COLACE) 100 MG capsule Take 100 mg by mouth daily as needed for mild constipation.     [provider]  metFORMIN (GLUCOPHAGE) 1000  MG tablet Take 1,000 mg by mouth every morning. 01/14/19   [provider]  montelukast (SINGULAIR) 10 MG tablet Take 1 tablet (10 mg total) by mouth at bedtime. Patient not taking: Reported on 04/08/2019 10/26/17   Bobbitt, Sedalia Muta, MD  pantoprazole (PROTONIX) 20 MG tablet Take 1 tablet (20 mg total) by mouth daily. Patient not taking: Reported on 04/08/2019 03/29/18   Sherwood Gambler, MD  phenylephrine (SUDAFED PE) 10 MG TABS tablet Take 10 mg by mouth every 4 (four) hours as needed (CONGESTION).    [provider]    Allergies    Patient has no known allergies.  Review of Systems   Review of Systems  Respiratory: Positive for shortness of breath.   Cardiovascular: Negative for chest pain.  All other systems reviewed and are negative.   Physical Exam Updated Vital Signs BP (!) 144/94 (BP Location: Left Arm)   Pulse (!) 102   Temp 98.6 F (37 C) (Oral)   Resp 20   Ht 5\' 6"  (1.676 m)   Wt 105.7 kg   LMP 05/12/1982   SpO2 96%   BMI 37.61 kg/m   Physical Exam Vitals and nursing note reviewed.  Constitutional:      General: She is not in acute distress.    Appearance: She is well-developed. She is not diaphoretic.  HENT:     Head: Normocephalic and atraumatic.  Cardiovascular:     Rate and Rhythm: Normal rate and regular rhythm.     Heart sounds: No murmur. No friction rub. No gallop.   Pulmonary:     Effort: Pulmonary effort is normal.     Breath sounds: Wheezing present. No rales.  Abdominal:     General: Bowel sounds are normal. There is no distension.     Palpations: Abdomen is soft.     Tenderness: There is no abdominal tenderness.  Musculoskeletal:        General: Normal range of motion.     Cervical back: Normal range of motion and neck supple.     Right lower leg: No edema.     Left lower leg: No edema.  Skin:    General: Skin is warm and dry.  Neurological:     Mental Status: She is alert and oriented to person, place, and time.      ED Results / Procedures / Treatments   Labs (all labs ordered are listed, but only abnormal results are displayed) Labs Reviewed - No data to display  EKG None  Radiology No  results found.  Procedures Procedures (including critical care time)  Medications Ordered in ED Medications  albuterol (VENTOLIN HFA) 108 (90 Base) MCG/ACT inhaler 2 puff (has no administration in time range)  predniSONE (DELTASONE) tablet 40 mg (has no administration in time range)    ED Course  I have reviewed the triage vital signs and the nursing notes.  Pertinent labs & imaging results that were available during my care of the patient were reviewed by me and considered in my medical decision making (see chart for details).    MDM Rules/Calculators/A&P    Patient presenting with complaints of wheezing, shortness of breath that appears related to an asthma exacerbation.  She is not hypoxic, febrile, or in any respiratory distress.  She does have slight expiratory wheezes.  She will be given an albuterol mdi and prednisone.  She is to be discharged with follow up tomorrow as scheduled already with her pcp.  Final Clinical Impression(s) / ED Diagnoses Final diagnoses:  None    Rx / DC Orders ED Discharge Orders    None       Veryl Speak, MD 07/12/19 1549

## 2019-07-12 NOTE — Discharge Instructions (Addendum)
Continue use of your steroid inhaler as before.  Begin using albuterol inhaler, 2 puffs every 4 hours as needed for wheezing.  Begin taking prednisone as prescribed.  Follow-up with your primary doctor tomorrow as scheduled, and return to the ER if symptoms significantly worsen or change.

## 2019-10-04 ENCOUNTER — Other Ambulatory Visit: Payer: Self-pay | Admitting: Allergy and Immunology

## 2019-10-19 ENCOUNTER — Other Ambulatory Visit: Payer: Self-pay

## 2019-10-19 ENCOUNTER — Encounter (HOSPITAL_COMMUNITY): Payer: Self-pay | Admitting: Emergency Medicine

## 2019-10-19 DIAGNOSIS — S3991XA Unspecified injury of abdomen, initial encounter: Secondary | ICD-10-CM | POA: Diagnosis present

## 2019-10-19 DIAGNOSIS — Z86010 Personal history of colonic polyps: Secondary | ICD-10-CM | POA: Insufficient documentation

## 2019-10-19 DIAGNOSIS — S76811A Strain of other specified muscles, fascia and tendons at thigh level, right thigh, initial encounter: Secondary | ICD-10-CM | POA: Diagnosis not present

## 2019-10-19 DIAGNOSIS — Y93H3 Activity, building and construction: Secondary | ICD-10-CM | POA: Diagnosis not present

## 2019-10-19 DIAGNOSIS — Y92007 Garden or yard of unspecified non-institutional (private) residence as the place of occurrence of the external cause: Secondary | ICD-10-CM | POA: Insufficient documentation

## 2019-10-19 DIAGNOSIS — X500XXA Overexertion from strenuous movement or load, initial encounter: Secondary | ICD-10-CM | POA: Diagnosis not present

## 2019-10-19 DIAGNOSIS — Z79899 Other long term (current) drug therapy: Secondary | ICD-10-CM | POA: Diagnosis not present

## 2019-10-19 DIAGNOSIS — Y999 Unspecified external cause status: Secondary | ICD-10-CM | POA: Diagnosis not present

## 2019-10-19 DIAGNOSIS — Z7984 Long term (current) use of oral hypoglycemic drugs: Secondary | ICD-10-CM | POA: Insufficient documentation

## 2019-10-19 DIAGNOSIS — J4520 Mild intermittent asthma, uncomplicated: Secondary | ICD-10-CM | POA: Diagnosis not present

## 2019-10-19 DIAGNOSIS — E119 Type 2 diabetes mellitus without complications: Secondary | ICD-10-CM | POA: Insufficient documentation

## 2019-10-19 DIAGNOSIS — Z87891 Personal history of nicotine dependence: Secondary | ICD-10-CM | POA: Insufficient documentation

## 2019-10-19 LAB — COMPREHENSIVE METABOLIC PANEL
ALT: 31 U/L (ref 0–44)
AST: 32 U/L (ref 15–41)
Albumin: 3.9 g/dL (ref 3.5–5.0)
Alkaline Phosphatase: 65 U/L (ref 38–126)
Anion gap: 11 (ref 5–15)
BUN: 12 mg/dL (ref 8–23)
CO2: 27 mmol/L (ref 22–32)
Calcium: 9.4 mg/dL (ref 8.9–10.3)
Chloride: 102 mmol/L (ref 98–111)
Creatinine, Ser: 0.68 mg/dL (ref 0.44–1.00)
GFR calc Af Amer: >60 mL/min (ref >60)
GFR calc non Af Amer: >60 mL/min (ref >60)
Glucose, Bld: 175 mg/dL — ABNORMAL HIGH (ref 70–99)
Potassium: 4.4 mmol/L (ref 3.5–5.1)
Sodium: 140 mmol/L (ref 135–145)
Total Bilirubin: 0.6 mg/dL (ref 0.3–1.2)
Total Protein: 7.5 g/dL (ref 6.5–8.1)

## 2019-10-19 LAB — CBC
HCT: 40.6 % (ref 36.0–46.0)
Hemoglobin: 13.5 g/dL (ref 12.0–15.0)
MCH: 29.9 pg (ref 26.0–34.0)
MCHC: 33.3 g/dL (ref 30.0–36.0)
MCV: 90 fL (ref 80.0–100.0)
Platelets: 267 10*3/uL (ref 150–400)
RBC: 4.51 MIL/uL (ref 3.87–5.11)
RDW: 12.6 % (ref 11.5–15.5)
WBC: 8.7 10*3/uL (ref 4.0–10.5)
nRBC: 0 % (ref 0.0–0.2)

## 2019-10-19 LAB — URINALYSIS, ROUTINE W REFLEX MICROSCOPIC
Bacteria, UA: NONE SEEN
Bilirubin Urine: NEGATIVE
Glucose, UA: 50 mg/dL — AB
Hgb urine dipstick: NEGATIVE
Ketones, ur: NEGATIVE mg/dL
Nitrite: NEGATIVE
Protein, ur: NEGATIVE mg/dL
Specific Gravity, Urine: 1.014 (ref 1.005–1.030)
pH: 7 (ref 5.0–8.0)

## 2019-10-19 LAB — LIPASE, BLOOD: Lipase: 35 U/L (ref 11–51)

## 2019-10-19 MED ORDER — SODIUM CHLORIDE 0.9% FLUSH: 3.0000 mL | Freq: Once | INTRAVENOUS | Status: DC

## 2019-10-19 MED ORDER — ONDANSETRON 4 MG PO TBDP: 4.0000 mg | ORAL_TABLET | Freq: Once | ORAL | Status: DC | PRN

## 2019-10-19 NOTE — ED Triage Notes (Signed)
Patient c/o RLQ and right groin pain with nausea today. Denies vomiting and diarrhea. Hx kidney stones.

## 2019-10-20 ENCOUNTER — Emergency Department (HOSPITAL_COMMUNITY)
Admission: EM | Admit: 2019-10-20 | Discharge: 2019-10-20 | Disposition: A | Discharge status: Home / Self Care | Payer: Medicare HMO | Attending: Emergency Medicine | Admitting: Emergency Medicine

## 2019-10-20 DIAGNOSIS — S39013A Strain of muscle, fascia and tendon of pelvis, initial encounter: Secondary | ICD-10-CM

## 2019-10-20 DIAGNOSIS — S39011A Strain of muscle, fascia and tendon of abdomen, initial encounter: Secondary | ICD-10-CM

## 2019-10-20 NOTE — ED Provider Notes (Signed)
[facility] DEPT Provider Note   CSN: [id number] Arrival date & time: 10/19/19  1921     History Chief Complaint  Patient presents with  . Abdominal Pain    MARRIETTA [name] is a 69 y.o. female who presents the emergency department with a chief complaint of right groin pain.  The patient reports that she has had approximately 5 episodes of sharp, nonradiating, intermittent right groin pain, onset earlier today.  She reports that the first episode of pain began while she was walking.  However, she does report that she had an episode earlier today while she was sitting down.  She notes that she has not had a episode of pain for 3 hours prior to my evaluation.  She denies abdominal pain, nausea, vomiting, dysuria, hematuria, urinary frequency or hesitancy, diarrhea, constipation, fever, chills, vaginal bleeding, or discharge, numbness, weakness, bilateral hip pain, left-sided groin pain.  She does recall that yesterday she was helping to build a new fence in her yard and was pulling a piece of the fence around the yard as well as lifting, pushing, and pulling on a large planting pots in the yard.  No other known injuries or trauma.  She does have a remote history of kidney stone, but states that these symptoms do not feel similar to prior kidney stones.  No treatment prior to arrival.  The history is provided by the patient. No language interpreter was used.       Past Medical History:  Diagnosis Date  . Colon polyp, hyperplastic 03/29/2010  . Diabetes mellitus   . History of kidney stones   . Mild intermittent asthma     Patient Active Problem List   Diagnosis Date Noted  . Allergic rhinitis with a nonallergic component 10/26/2017  . Mild intermittent asthma 10/26/2017  . Allergic conjunctivitis 10/26/2017  . Cough 04/27/2014  . Diabetes ([place]) 08/10/2013  . History of kidney stones   . Colon polyp, hyperplastic 03/29/2010    Past Surgical  History:  Procedure Laterality Date  . CYST EXCISION Left 15 years ago   Axillary   . ECTOPIC PREGNANCY SURGERY    . OVARIAN CYST REMOVAL  35 years ago  . TOTAL ABDOMINAL HYSTERECTOMY  1984     OB History    Gravida  3   Para  2   Term  2   Preterm      AB  1   Living  2     SAB  1   TAB      Ectopic      Multiple      Live Births              Family History  Problem Relation Age of Onset  . Diabetes Father   . Diabetes Brother   . Hypertension Mother   . Hypertension Sister   . Vaginal cancer Maternal Grandmother   . Heart disease Maternal Grandfather   . Heart attack Maternal Grandfather   . Thyroid disease Daughter   . Allergies Daughter   . Allergies Sister     Social History   Tobacco Use  . Smoking status: Former Smoker    Packs/day: 0.50    Years: 12.00    Pack years: 6.00    Types: Cigarettes    Quit date: 05/12/1980    Years since quitting: 39.4  . Smokeless tobacco: Never Used  Vaping Use  . Vaping Use: Never used  Substance Use Topics  .  Alcohol use: Yes    Alcohol/week: 0.0 standard drinks    Comment: "Birthdays"  . Drug use: No    Home Medications Prior to Admission medications   Medication Sig Start Date End Date Taking? Authorizing Provider  albuterol (PROAIR HFA) 108 (90 Base) MCG/ACT inhaler Inhale 1-2 puffs into the lungs every 4 (four) hours as needed for wheezing or shortness of breath.  08/26/17   [provider]  aspirin 500 MG EC tablet Take 500-1,000 mg by mouth every 6 (six) hours as needed for pain.    [provider]  Azelastine HCl 0.15 % SOLN Place 2 sprays into both nostrils 2 (two) times daily. Patient not taking: Reported on 04/08/2019 10/26/17   Bobbitt, Sedalia Muta, MD  BREO ELLIPTA 200-25 MCG/INH AEPB Inhale 1 puff into the lungs daily. 07/11/19   [provider]  Carbinoxamine Maleate 4 MG TABS Take 1 tablet (4 mg total) by mouth every 8 (eight) hours. Patient not taking:  Reported on 03/29/2018 10/26/17   Bobbitt, Sedalia Muta, MD  cholecalciferol (VITAMIN D3) 25 MCG (1000 UNIT) tablet Take 1,000 Units by mouth daily.    [provider]  docusate sodium (COLACE) 100 MG capsule Take 100 mg by mouth daily as needed for mild constipation.     [provider]  metFORMIN (GLUCOPHAGE) 1000 MG tablet Take 1,000 mg by mouth every morning. 01/14/19   [provider]  montelukast (SINGULAIR) 10 MG tablet Take 1 tablet (10 mg total) by mouth at bedtime. Patient not taking: Reported on 04/08/2019 10/26/17   Bobbitt, Sedalia Muta, MD  pantoprazole (PROTONIX) 20 MG tablet Take 1 tablet (20 mg total) by mouth daily. Patient not taking: Reported on 04/08/2019 03/29/18   Sherwood Gambler, MD  phenylephrine (SUDAFED PE) 10 MG TABS tablet Take 10 mg by mouth every 4 (four) hours as needed (CONGESTION).    [provider]  predniSONE (DELTASONE) 10 MG tablet Take 2 tablets (20 mg total) by mouth 2 (two) times daily with a meal. 07/12/19   Veryl Speak, MD    Allergies    Patient has no known allergies.  Review of Systems   Review of Systems  Constitutional: Negative for activity change, chills and fever.  Respiratory: Negative for shortness of breath.   Cardiovascular: Negative for chest pain.  Gastrointestinal: Negative for abdominal pain, anal bleeding, blood in stool, constipation, diarrhea, nausea and vomiting.       Right groin pain  Genitourinary: Negative for dysuria, flank pain, frequency, genital sores, hematuria, menstrual problem and urgency.  Musculoskeletal: Negative for back pain, joint swelling, myalgias, neck pain and neck stiffness.  Skin: Negative for rash.  Allergic/Immunologic: Negative for immunocompromised state.  Neurological: Negative for dizziness, seizures, syncope, weakness, numbness and headaches.  Psychiatric/Behavioral: Negative for confusion.    Physical Exam Updated Vital Signs BP (!) 152/86   Pulse 75   Temp  98.6 F (37 C) (Oral)   Resp 20   Ht 5\' 6"  (1.676 m)   Wt 104.3 kg   LMP 05/12/1982   SpO2 100%   BMI 37.12 kg/m   Physical Exam Vitals and nursing note reviewed.  Constitutional:      General: She is not in acute distress.    Appearance: She is not ill-appearing, toxic-appearing or diaphoretic.     Comments: Well-appearing.  No acute distress.  HENT:     Head: Normocephalic.  Eyes:     Conjunctiva/sclera: Conjunctivae normal.  Cardiovascular:     Rate and  Rhythm: Normal rate and regular rhythm.     Heart sounds: No murmur heard.  No friction rub. No gallop.   Pulmonary:     Effort: Pulmonary effort is normal. No respiratory distress.     Breath sounds: No stridor. No wheezing, rhonchi or rales.  Chest:     Chest wall: No tenderness.  Abdominal:     General: There is no distension.     Palpations: Abdomen is soft. There is no mass.     Tenderness: There is no abdominal tenderness. There is no right CVA tenderness, left CVA tenderness, guarding or rebound.     Hernia: No hernia is present.     Comments: Abdomen is soft, nontender, nondistended.  No CVA tenderness bilaterally.  No lower abdominal tenderness, specifically, no tenderness over McBurney's point.  Genitourinary:    Comments: No inguinal lymphadenopathy bilaterally.  Patient has no reproducible tenderness within the femoral triangle. Musculoskeletal:     Cervical back: Neck supple.     Right lower leg: No edema.     Left lower leg: No edema.     Comments: Full active and passive range of motion of the bilateral hips, knees, and ankles.  No pain with range of motion.  Neurovascularly intact.  No tenderness palpation to the lumbar spinous processes or bilateral paraspinal muscles.  No SI joint tenderness bilaterally.  Skin:    General: Skin is warm.     Findings: No rash.  Neurological:     Mental Status: She is alert.     Coordination: Abnormal coordination:   Psychiatric:        Behavior: Behavior normal.      ED Results / Procedures / Treatments   Labs (all labs ordered are listed, but only abnormal results are displayed) Labs Reviewed  COMPREHENSIVE METABOLIC PANEL - Abnormal; Notable for the following components:      Result Value   Glucose, Bld 175 (*)    All other components within normal limits  URINALYSIS, ROUTINE W REFLEX MICROSCOPIC - Abnormal; Notable for the following components:   Glucose, UA 50 (*)    Leukocytes,Ua SMALL (*)    All other components within normal limits  LIPASE, BLOOD  CBC    EKG None  Radiology No results found.  Procedures Procedures (including critical care time)  Medications Ordered in ED Medications - No data to display  ED Course  I have reviewed the triage vital signs and the nursing notes.  Pertinent labs & imaging results that were available during my care of the patient were reviewed by me and considered in my medical decision making (see chart for details).    MDM Rules/Calculators/A&P                          70 year old female with a history of diabetes mellitus, asthma who presents to the emergency department with intermittent right groin pain, onset earlier today.  Symptoms began after she worked out some weight lifting, pushing, pulling, large planting pots and pieces of fence.  She does have a history of kidney stones, but states that the symptoms do not feel similar.  Urinalysis is unremarkable.  Less likely nephrolithiasis given that patient does not have hemoglobinuria.  She has no leukocytosis.  There are no electrolyte derangements.  She is mildly hypertensive, but labs are otherwise unremarkable.  On exam, she has no reproducible tenderness to palpation and has been pain-free for more than 3 hours.  Strongly  suspect groin strain given onset of symptoms after new activities less than 24 hours ago.  Patient was seen and independently evaluated by Dr. Leonides Schanz, attending physician who is in agreement with work-up and plan.  Patient  reports that she is feeling much better and is asymptomatic at this time.  Recommended follow-up with primary care if symptoms return.  RICE therapy recommended if symptoms return.  Doubt inguinal or femoral hernia, ovarian torsion, referred pain from nephrolithiasis, appendicitis, right hip fracture, septic joint, gout.  She was also given strict return precautions to the ER.  She is hemodynamically stable in no acute distress.  Safe discharge home with outpatient follow-up as indicated.  Final Clinical Impression(s) / ED Diagnoses Final diagnoses:  Strain of muscle of right groin region    Rx / DC Orders ED Discharge Orders    None       Zeba Luby A, PA-C 10/20/19 0856    Ward, Delice Bison, DO 10/21/19 0011

## 2019-10-20 NOTE — Discharge Instructions (Signed)
Thank you for allowing me to care for you today in the Emergency Department.   You were seen today for pain in your right groin.  Your work-up today was reassuring.  Apply an ice pack for 15 to 20 minutes up to 3-4 times a day for the next 5 days as needed for pain.  You can take 650 mg of Tylenol once every 6 hours as needed if your pain returns.  Try to stretch the muscles of your hip and groin as needed.  If pain continues to intermittently occur, you can follow-up with your primary care provider.  Return the emergency department if develop high fevers, chills, if you start having blood in your urine, severe abdominal pain, uncontrollable vomiting, new numbness or weakness, or other new, concerning symptoms.

## 2020-03-13 ENCOUNTER — Other Ambulatory Visit: Payer: Self-pay | Admitting: Internal Medicine

## 2020-03-13 DIAGNOSIS — Z1231 Encounter for screening mammogram for malignant neoplasm of breast: Secondary | ICD-10-CM

## 2020-04-21 ENCOUNTER — Other Ambulatory Visit: Payer: Self-pay

## 2020-04-21 ENCOUNTER — Emergency Department (HOSPITAL_COMMUNITY): Payer: Medicare HMO

## 2020-04-21 ENCOUNTER — Encounter (HOSPITAL_COMMUNITY): Payer: Self-pay

## 2020-04-21 ENCOUNTER — Emergency Department (HOSPITAL_COMMUNITY)
Admission: EM | Admit: 2020-04-21 | Discharge: 2020-04-21 | Disposition: A | Discharge status: Home / Self Care | Payer: Medicare HMO | Attending: Emergency Medicine | Admitting: Emergency Medicine

## 2020-04-21 ENCOUNTER — Ambulatory Visit (HOSPITAL_COMMUNITY)
Admission: EM | Admit: 2020-04-21 | Discharge: 2020-04-21 | Disposition: A | Discharge status: Home / Self Care | Payer: Medicare HMO | Attending: Family Medicine | Admitting: Family Medicine

## 2020-04-21 DIAGNOSIS — R079 Chest pain, unspecified: Secondary | ICD-10-CM | POA: Diagnosis present

## 2020-04-21 DIAGNOSIS — Z87891 Personal history of nicotine dependence: Secondary | ICD-10-CM | POA: Diagnosis not present

## 2020-04-21 DIAGNOSIS — J4520 Mild intermittent asthma, uncomplicated: Secondary | ICD-10-CM | POA: Diagnosis not present

## 2020-04-21 DIAGNOSIS — Z7984 Long term (current) use of oral hypoglycemic drugs: Secondary | ICD-10-CM | POA: Diagnosis not present

## 2020-04-21 DIAGNOSIS — Z7982 Long term (current) use of aspirin: Secondary | ICD-10-CM | POA: Insufficient documentation

## 2020-04-21 DIAGNOSIS — M62838 Other muscle spasm: Secondary | ICD-10-CM | POA: Diagnosis not present

## 2020-04-21 DIAGNOSIS — M79602 Pain in left arm: Secondary | ICD-10-CM | POA: Diagnosis not present

## 2020-04-21 DIAGNOSIS — E119 Type 2 diabetes mellitus without complications: Secondary | ICD-10-CM | POA: Diagnosis not present

## 2020-04-21 DIAGNOSIS — R29898 Other symptoms and signs involving the musculoskeletal system: Secondary | ICD-10-CM

## 2020-04-21 DIAGNOSIS — R42 Dizziness and giddiness: Secondary | ICD-10-CM

## 2020-04-21 LAB — CBC
HCT: 42.9 % (ref 36.0–46.0)
Hemoglobin: 13.9 g/dL (ref 12.0–15.0)
MCH: 28.5 pg (ref 26.0–34.0)
MCHC: 32.4 g/dL (ref 30.0–36.0)
MCV: 87.9 fL (ref 80.0–100.0)
Platelets: 290 10*3/uL (ref 150–400)
RBC: 4.88 MIL/uL (ref 3.87–5.11)
RDW: 12.4 % (ref 11.5–15.5)
WBC: 7.4 10*3/uL (ref 4.0–10.5)
nRBC: 0 % (ref 0.0–0.2)

## 2020-04-21 LAB — BASIC METABOLIC PANEL WITH GFR
Anion gap: 10 (ref 5–15)
BUN: 8 mg/dL (ref 8–23)
CO2: 25 mmol/L (ref 22–32)
Calcium: 10.1 mg/dL (ref 8.9–10.3)
Chloride: 102 mmol/L (ref 98–111)
Creatinine, Ser: 0.62 mg/dL (ref 0.44–1.00)
GFR, Estimated: >60 mL/min
Glucose, Bld: 175 mg/dL — ABNORMAL HIGH (ref 70–99)
Potassium: 4 mmol/L (ref 3.5–5.1)
Sodium: 137 mmol/L (ref 135–145)

## 2020-04-21 LAB — TROPONIN I (HIGH SENSITIVITY): Troponin I (High Sensitivity): <2 ng/L

## 2020-04-21 LAB — CBG MONITORING, ED
Glucose-Capillary: 194 mg/dL — ABNORMAL HIGH (ref 70–99)
Glucose-Capillary: 217 mg/dL — ABNORMAL HIGH (ref 70–99)

## 2020-04-21 MED ORDER — METHOCARBAMOL 500 MG PO TABS: 500.0000 mg | ORAL_TABLET | Freq: Two times a day (BID) | ORAL | 0 refills | Status: DC | PRN

## 2020-04-21 NOTE — ED Triage Notes (Signed)
Pt present chest pain with SOB,pt state symptom started two days ago.

## 2020-04-21 NOTE — Discharge Instructions (Signed)
If you develop recurrent, continued, or worsening chest pain, shortness of breath, fever, vomiting, abdominal or back pain, or any other new/concerning symptoms then return to the ER for evaluation.  

## 2020-04-21 NOTE — ED Provider Notes (Signed)
[place]    CSN: [id number] Arrival date & time: 04/21/20  1021      History   Chief Complaint Chief Complaint  Patient presents with  . Chest Pain    HPI Amy [name] is a 69 y.o. female.   HPI Patient presents today with 4 days of sharp chest pain and shortness of breath,  with left extremity weakness, medical history significant for uncontrolled type 2 diabetes, mild asthma and obesity.  Patient is followed by her primary care provider.  She denies ever having previous pain or symptoms similar to the type that she has had today.  Her vital signs are stable patient seen in triage and endorses some mild increased work of breathing.  EKG is normal sinus rhythm she endorses chest pain at present.  EMS called to transport patient to the ER for further work-up and evaluation to rule out ACS and determine source of left-sided weakness. Past Medical History:  Diagnosis Date  . Colon polyp, hyperplastic 03/29/2010  . Diabetes mellitus   . History of kidney stones   . Mild intermittent asthma     Patient Active Problem List   Diagnosis Date Noted  . Allergic rhinitis with a nonallergic component 10/26/2017  . Mild intermittent asthma 10/26/2017  . Allergic conjunctivitis 10/26/2017  . Cough 04/27/2014  . Diabetes ([place]) 08/10/2013  . History of kidney stones   . Colon polyp, hyperplastic 03/29/2010    Past Surgical History:  Procedure Laterality Date  . CYST EXCISION Left 15 years ago   Axillary   . ECTOPIC PREGNANCY SURGERY    . OVARIAN CYST REMOVAL  35 years ago  . TOTAL ABDOMINAL HYSTERECTOMY  1984    OB History    Gravida  3   Para  2   Term  2   Preterm      AB  1   Living  2     SAB  1   IAB      Ectopic      Multiple      Live Births               Home Medications    Prior to Admission medications   Medication Sig Start Date End Date Taking? Authorizing Provider  albuterol (PROAIR HFA) 108 (90 Base) MCG/ACT inhaler  Inhale 1-2 puffs into the lungs every 4 (four) hours as needed for wheezing or shortness of breath.  08/26/17   [provider]  aspirin 500 MG EC tablet Take 500-1,000 mg by mouth every 6 (six) hours as needed for pain.    [provider]  Azelastine HCl 0.15 % SOLN Place 2 sprays into both nostrils 2 (two) times daily. Patient not taking: Reported on 04/08/2019 10/26/17   Bobbitt, Sedalia Muta, MD  BREO ELLIPTA 200-25 MCG/INH AEPB Inhale 1 puff into the lungs daily. 07/11/19   [provider]  Carbinoxamine Maleate 4 MG TABS Take 1 tablet (4 mg total) by mouth every 8 (eight) hours. Patient not taking: Reported on 03/29/2018 10/26/17   Bobbitt, Sedalia Muta, MD  cholecalciferol (VITAMIN D3) 25 MCG (1000 UNIT) tablet Take 1,000 Units by mouth daily.    [provider]  docusate sodium (COLACE) 100 MG capsule Take 100 mg by mouth daily as needed for mild constipation.     [provider]  metFORMIN (GLUCOPHAGE) 1000 MG tablet Take 1,000 mg by mouth every morning. 01/14/19   [provider]  montelukast (SINGULAIR) 10 MG  tablet Take 1 tablet (10 mg total) by mouth at bedtime. Patient not taking: Reported on 04/08/2019 10/26/17   Bobbitt, Sedalia Muta, MD  pantoprazole (PROTONIX) 20 MG tablet Take 1 tablet (20 mg total) by mouth daily. Patient not taking: Reported on 04/08/2019 03/29/18   Sherwood Gambler, MD  phenylephrine (SUDAFED PE) 10 MG TABS tablet Take 10 mg by mouth every 4 (four) hours as needed (CONGESTION).    [provider]  predniSONE (DELTASONE) 10 MG tablet Take 2 tablets (20 mg total) by mouth 2 (two) times daily with a meal. 07/12/19   Veryl Speak, MD    Family History Family History  Problem Relation Age of Onset  . Diabetes Father   . Diabetes Brother   . Hypertension Mother   . Hypertension Sister   . Vaginal cancer Maternal Grandmother   . Heart disease Maternal Grandfather   . Heart attack Maternal Grandfather   .  Thyroid disease Daughter   . Allergies Daughter   . Allergies Sister     Social History Social History   Tobacco Use  . Smoking status: Former Smoker    Packs/day: 0.50    Years: 12.00    Pack years: 6.00    Types: Cigarettes    Quit date: 05/12/1980    Years since quitting: 39.9  . Smokeless tobacco: Never Used  Vaping Use  . Vaping Use: Never used  Substance Use Topics  . Alcohol use: Yes    Alcohol/week: 0.0 standard drinks    Comment: "Birthdays"  . Drug use: No     Allergies   Patient has no known allergies.   Review of Systems Review of Systems Pertinent negatives listed in HPI   Physical Exam Triage Vital Signs ED Triage Vitals [04/21/20 1027]  Enc Vitals Group     BP 136/80     Pulse Rate 77     Resp 18     Temp 98.8 F (37.1 C)     Temp Source Oral     SpO2 100 %     Weight      Height      Head Circumference      Peak Flow      Pain Score      Pain Loc      Pain Edu?      Excl. in [place]?    No data found.  Updated Vital Signs BP 136/80 (BP Location: Right Arm)   Pulse 77   Temp 98.8 F (37.1 C) (Oral)   Resp 18   LMP 05/12/1982   SpO2 100%   Visual Acuity Right Eye Distance:   Left Eye Distance:   Bilateral Distance:    Right Eye Near:   Left Eye Near:    Bilateral Near:     Physical Exam Constitutional:      General: She is in acute distress.     Appearance: She is obese.  HENT:     Head: Normocephalic.  Cardiovascular:     Rate and Rhythm: Normal rate and regular rhythm.  Pulmonary:     Effort: Tachypnea present.     Breath sounds: No decreased breath sounds.  Musculoskeletal:        General: Normal range of motion.     Cervical back: Normal range of motion and neck supple.  Skin:    General: Skin is warm.     Capillary Refill: Capillary refill takes less than 2 seconds.  Psychiatric:  Mood and Affect: Mood is anxious.      UC Treatments / Results  Labs (all labs ordered are listed, but only abnormal  results are displayed) Labs Reviewed - No data to display  EKG Normal sinus rhythm, no ST changes, no evidence ischemia  Radiology No results found.  Procedures Procedures (including critical care time)  Medications Ordered in UC Medications - No data to display  Initial Impression / Assessment and Plan / UC Course  I have reviewed the triage vital signs and the nursing notes.  Pertinent labs & imaging results that were available during my care of the patient were reviewed by me and considered in my medical decision making (see chart for details).     Patient presents today with over 4 days of chest pain with gradually onset of left upper extremity weakness given limitations of urgent care patient is being directed via EMS to be evaluated at [facility] emergency department to rule out ACS and further work-up of source of left extremity weakness.  Given age and comorbidities patient is at risk for a cardiovascular event.  Patient verbalized understanding and agreement with this plan.  EKG normal sinus rhythm no evidence of ischemia.  GCS is 15 patient is slightly anxious but otherwise stable. Final Clinical Impressions(s) / UC Diagnoses   Final diagnoses:  Chest pain, unspecified type  Dizziness and giddiness   Discharge Instructions   None    ED Prescriptions    None     PDMP not reviewed this encounter.   Scot Jun, FNP 04/21/20 1041

## 2020-04-21 NOTE — ED Notes (Signed)
Called pt for vitals no answer 

## 2020-04-21 NOTE — ED Notes (Signed)
EMS present for transport to ED.  Report called to Roselyn Reef, ED Charge RN.

## 2020-04-21 NOTE — ED Triage Notes (Signed)
Pt sent here from UC for further eval of weakness and abnormal sensation in her arm x 6 days. Pt also reports intermittent L shoulder pain and central chest pain. Pt reports sensation is normal in her arm at this time, but has stiffness in her pinky finger. Thinks all her symptoms are r/t taking a large amount of laxatives on Monday d/t not having a bm in 4 days.

## 2020-04-21 NOTE — ED Notes (Signed)
Ems has been called.

## 2020-04-21 NOTE — ED Provider Notes (Signed)
[facility] EMERGENCY DEPARTMENT Provider Note   CSN: [id number] Arrival date & time: 04/21/20  1116     History Chief Complaint  Patient presents with  . Chest Pain    Amy [name] is a 69 y.o. female.  HPI 69 year old female presents with chest pain and also left arm pain. They feel like different things to her.  Last night she developed chest pain after taking multiple different laxatives to help relieve her constipation.  After that she developed 2 distinct and very short episodes of sharp chest pain.  Last episode was last night.  No recurrent chest pain.  However for about a week she has been having what she feels like is a muscle spasm in her trapezius where it is sore and feels better with massaging.  Over the last day or so she has had a tired left arm and it feels like there is discomfort radiating from her trapezius to her upper arm.  It is not actually weak or numb.  The arm discomfort seems to be improving.   Past Medical History:  Diagnosis Date  . Colon polyp, hyperplastic 03/29/2010  . Diabetes mellitus   . History of kidney stones   . Mild intermittent asthma     Patient Active Problem List   Diagnosis Date Noted  . Allergic rhinitis with a nonallergic component 10/26/2017  . Mild intermittent asthma 10/26/2017  . Allergic conjunctivitis 10/26/2017  . Cough 04/27/2014  . Diabetes ([place]) 08/10/2013  . History of kidney stones   . Colon polyp, hyperplastic 03/29/2010    Past Surgical History:  Procedure Laterality Date  . CYST EXCISION Left 15 years ago   Axillary   . ECTOPIC PREGNANCY SURGERY    . OVARIAN CYST REMOVAL  35 years ago  . TOTAL ABDOMINAL HYSTERECTOMY  1984     OB History    Gravida  3   Para  2   Term  2   Preterm      AB  1   Living  2     SAB  1   IAB      Ectopic      Multiple      Live Births              Family History  Problem Relation Age of Onset  . Diabetes Father   . Diabetes  Brother   . Hypertension Mother   . Hypertension Sister   . Vaginal cancer Maternal Grandmother   . Heart disease Maternal Grandfather   . Heart attack Maternal Grandfather   . Thyroid disease Daughter   . Allergies Daughter   . Allergies Sister     Social History   Tobacco Use  . Smoking status: Former Smoker    Packs/day: 0.50    Years: 12.00    Pack years: 6.00    Types: Cigarettes    Quit date: 05/12/1980    Years since quitting: 39.9  . Smokeless tobacco: Never Used  Vaping Use  . Vaping Use: Never used  Substance Use Topics  . Alcohol use: Yes    Alcohol/week: 0.0 standard drinks    Comment: "Birthdays"  . Drug use: No    Home Medications Prior to Admission medications   Medication Sig Start Date End Date Taking? Authorizing Provider  albuterol (PROAIR HFA) 108 (90 Base) MCG/ACT inhaler Inhale 1-2 puffs into the lungs every 4 (four) hours as needed for wheezing or shortness of breath.  08/26/17   [provider]  aspirin 500 MG EC tablet Take 500-1,000 mg by mouth every 6 (six) hours as needed for pain.    [provider]  Azelastine HCl 0.15 % SOLN Place 2 sprays into both nostrils 2 (two) times daily. Patient not taking: Reported on 04/08/2019 10/26/17   Bobbitt, Sedalia Muta, MD  BREO ELLIPTA 200-25 MCG/INH AEPB Inhale 1 puff into the lungs daily. 07/11/19   [provider]  Carbinoxamine Maleate 4 MG TABS Take 1 tablet (4 mg total) by mouth every 8 (eight) hours. Patient not taking: Reported on 03/29/2018 10/26/17   Bobbitt, Sedalia Muta, MD  cholecalciferol (VITAMIN D3) 25 MCG (1000 UNIT) tablet Take 1,000 Units by mouth daily.    [provider]  docusate sodium (COLACE) 100 MG capsule Take 100 mg by mouth daily as needed for mild constipation.     [provider]  metFORMIN (GLUCOPHAGE) 1000 MG tablet Take 1,000 mg by mouth every morning. 01/14/19   [provider]  methocarbamol (ROBAXIN) 500 MG tablet Take 1  tablet (500 mg total) by mouth 2 (two) times daily as needed for muscle spasms. 04/21/20   Sherwood Gambler, MD  montelukast (SINGULAIR) 10 MG tablet Take 1 tablet (10 mg total) by mouth at bedtime. Patient not taking: Reported on 04/08/2019 10/26/17   Bobbitt, Sedalia Muta, MD  pantoprazole (PROTONIX) 20 MG tablet Take 1 tablet (20 mg total) by mouth daily. Patient not taking: Reported on 04/08/2019 03/29/18   Sherwood Gambler, MD  phenylephrine (SUDAFED PE) 10 MG TABS tablet Take 10 mg by mouth every 4 (four) hours as needed (CONGESTION).    [provider]  predniSONE (DELTASONE) 10 MG tablet Take 2 tablets (20 mg total) by mouth 2 (two) times daily with a meal. 07/12/19   Veryl Speak, MD    Allergies    Patient has no known allergies.  Review of Systems   Review of Systems  Respiratory: Negative for shortness of breath.   Cardiovascular: Positive for chest pain.  Musculoskeletal: Positive for myalgias and neck pain.  Neurological: Negative for weakness and numbness.  All other systems reviewed and are negative.   Physical Exam Updated Vital Signs BP 133/76   Pulse 66   Temp 98.6 F (37 C) (Oral)   Resp 16   Ht 5\' 6"  (1.676 m)   Wt 100.2 kg   LMP 05/12/1982   SpO2 100%   BMI 35.67 kg/m   Physical Exam Vitals and nursing note reviewed.  Constitutional:      Appearance: She is well-developed and well-nourished.  HENT:     Head: Normocephalic and atraumatic.     Right Ear: External ear normal.     Left Ear: External ear normal.     Nose: Nose normal.  Eyes:     General:        Right eye: No discharge.        Left eye: No discharge.  Cardiovascular:     Rate and Rhythm: Normal rate and regular rhythm.     Pulses:          Radial pulses are 2+ on the right side and 2+ on the left side.     Heart sounds: Normal heart sounds.  Pulmonary:     Effort: Pulmonary effort is normal.     Breath sounds: Normal breath sounds.  Abdominal:     Palpations: Abdomen is  soft.     Tenderness: There is no abdominal tenderness.  Musculoskeletal:     Cervical back: Normal range of motion and neck supple. No spinous process tenderness or muscular tenderness.  Skin:    General: Skin is warm and dry.  Neurological:     Mental Status: She is alert.     Comments: 5/5 strength in BUE. Normal gross sensation  Psychiatric:        Mood and Affect: Mood is not anxious.     ED Results / Procedures / Treatments   Labs (all labs ordered are listed, but only abnormal results are displayed) Labs Reviewed  BASIC METABOLIC PANEL - Abnormal; Notable for the following components:      Result Value   Glucose, Bld 175 (*)    All other components within normal limits  CBG MONITORING, ED - Abnormal; Notable for the following components:   Glucose-Capillary 194 (*)    All other components within normal limits  CBC  TROPONIN I (HIGH SENSITIVITY)    EKG EKG Interpretation  Date/Time:  Saturday April 21 2020 15:55:05 EST Ventricular Rate:  73 PR Interval:    QRS Duration: 100 QT Interval:  428 QTC Calculation: 472 R Axis:   53 Text Interpretation: Sinus rhythm Low voltage, precordial leads no acute ST/T changes no significant change since earlier in the day Confirmed by Sherwood Gambler [phone]) on 04/21/2020 3:57:25 PM   Radiology DG Chest 2 View  Result Date: 04/21/2020 CLINICAL DATA:  Chest pain. EXAM: CHEST - 2 VIEW COMPARISON:  April 08, 2019. FINDINGS: The heart size and mediastinal contours are within normal limits. Both lungs are clear. No pneumothorax or pleural effusion is noted. The visualized skeletal structures are unremarkable. IMPRESSION: No active cardiopulmonary disease. Electronically Signed   By: Marijo Conception M.D.   On: 04/21/2020 12:27    Procedures Procedures (including critical care time)  Medications Ordered in ED Medications - No data to display  ED Course  I have reviewed the triage vital signs and the nursing  notes.  Pertinent labs & imaging results that were available during my care of the patient were reviewed by me and considered in my medical decision making (see chart for details).    MDM Rules/Calculators/A&P                          Patient's chest pain was 2 very brief episodes last night and has not recurred today.  Given this with negative troponin this morning and benign ECG I think ACS is highly unlikely.  Doubt dissection or PE.  As far as her left arm "weakness", she indicates it was never weak but more sore and did not feel right.  There was no numbness.  Sound like it could be radiculopathy with her concomitant left trapezius type pain.  However at this point there are no acute neuro deficits to be worried about stroke or significant neuro compromise of her spine.  No emergent imaging indicated.  We will have her follow-up with PCP.  Will give short course of muscle relaxers. Final Clinical Impression(s) / ED Diagnoses Final diagnoses:  Nonspecific chest pain  Trapezius muscle spasm    Rx / DC Orders ED Discharge Orders         Ordered    methocarbamol (ROBAXIN) 500 MG tablet  2 times daily PRN        04/21/20 1558           Sherwood Gambler, MD 04/21/20 1559

## 2020-05-29 ENCOUNTER — Ambulatory Visit: Payer: Medicare HMO

## 2020-07-10 ENCOUNTER — Ambulatory Visit: Payer: Medicare HMO

## 2020-07-12 ENCOUNTER — Other Ambulatory Visit: Payer: Self-pay

## 2020-07-12 ENCOUNTER — Encounter: Payer: Medicare HMO | Attending: Physician Assistant | Admitting: Registered"

## 2020-07-12 ENCOUNTER — Encounter: Payer: Self-pay | Admitting: Registered"

## 2020-07-12 DIAGNOSIS — E119 Type 2 diabetes mellitus without complications: Secondary | ICD-10-CM | POA: Diagnosis not present

## 2020-07-12 NOTE — Patient Instructions (Signed)
Goals:  Follow Diabetes Meal Plan as instructed  Eat 3 meals and 2 snacks, every 3-5 hrs  Aim to have 1/2 plate non-starchy vegetables, 1/4 plate of lean protein, and 1/4 plate of starch/grain with lunch dinner.   Ex. Salad + grilled chicken + croutons/crackers + water  Ex. Baked chicken + collard greens + spinach + baked potato + water  Breakfast can be source of carbohydrates and source of protein.   Ex. Oatmeal + egg  Add lean protein foods to meals/snacks  Snacks can include source of carbohydrates + source of protein  Ex. Fruit and nuts  Ex. Trail mix  Ex. Peanut butter crackers  Ex. Cheese and crackers  Monitor glucose levels as instructed by your doctor.   Check blood sugar before breakfast and 2 hours after each meal.   Aim for 30 mins of physical activity daily

## 2020-07-12 NOTE — Progress Notes (Signed)
Diabetes Self-Management Education  Visit Type:  First/Initial  Appt. Start Time: 3:27 Appt. End Time: 4:43  07/17/2020  [name], identified by name and date of birth, is a 70 y.o. female with a diagnosis of Diabetes: Type 2.   ASSESSMENT  Pt states she has started drinking protein shakes, eating more vegetables, and attending water aerobics at [facility]. States she just started attending last week. Plans to go 3 days/week. States she also plans to walk with husband on non-water aerobics days.   States she was scared on Superbowl Sunday 2022 due to increased heart rate waking her up in the middle of the night. All tests were fine. States the next day her FBS was 167. States she remembers having a few alcoholic beverages that night as well as sweet food items. States she checks her BS once a day: FBS (144- 167). States she is unsure of recent A1c. Referral shows recent elevated A1c (7.4) from 03/2020. An increase from A1c (7.0) in 11/2019. Reports she will have labs taken next Mon, 3/7.  States she uses garlic powder, onion powder, I can't believe its not butter, seafood seasoning, and olive oil when cooking. States she does not eat fried foods; eats mostly baked and broiled foods. States since pandemic began she has been baking and eating sweets more often. States she does not eat bread due to cholesterol, and to lose weight, and to help with blood sugars. Reports she currently weighs 230 lbs. States she wants to weigh 170 lbs.    Last menstrual period 05/12/1982. There is no height or weight on file to calculate BMI.    Diabetes Self-Management Education - 07/12/20 1547      Psychosocial Assessment   Patient Belief/Attitude about Diabetes Motivated to manage diabetes    Self-care barriers None    Patient Concerns Nutrition/Meal planning    Special Needs None    Preferred Learning Style No preference indicated    Learning Readiness Change in progress      Complications    Last HgB A1C per patient/outside source 7.4 %    How often do you check your blood sugar? 1-2 times/day    Fasting Blood glucose range (mg/dL) 130-179    Number of hypoglycemic episodes per month 0    Number of hyperglycemic episodes per week 1    Can you tell when your blood sugar is high? Yes    What do you do if your blood sugar is high? nothing   states this is her first time experiencing hyperglycemia   Have you had a dilated eye exam in the past 12 months? Yes    Have you had a dental exam in the past 12 months? Yes    Are you checking your feet? Yes    How many days per week are you checking your feet? 7      Dietary Intake   Breakfast 8:30 am - oatmeal or cheerios + almond milk or coffee (with unsweetened creamer) + boiled egg    Lunch 12:30 pm - cabbage + squash + carrots + baked chicken    Snack (afternoon) cup of chips    Dinner 7 pm - cabbage + squash + baked chicken    Beverage(s) 2 c coffee, water (4*16 oz; 64 oz)      Exercise   Exercise Type Moderate (swimming / aerobic walking);Light (walking / raking leaves)    How many days per week to you exercise? 4    How  many minutes per day do you exercise? 55    Total minutes per week of exercise 220      Patient Education   Previous Diabetes Education Yes (please comment)    Disease state  Definition of diabetes, type 1 and 2, and the diagnosis of diabetes;Factors that contribute to the development of diabetes    Nutrition management  Role of diet in the treatment of diabetes and the relationship between the three main macronutrients and blood glucose level;Food label reading, portion sizes and measuring food.;Reviewed blood glucose goals for pre and post meals and how to evaluate the patients' food intake on their blood glucose level.;Effects of alcohol on blood glucose and safety factors with consumption of alcohol.;Information on hints to eating out and maintain blood glucose control.    Physical activity and exercise  Role  of exercise on diabetes management, blood pressure control and cardiac health.    Monitoring Purpose and frequency of SMBG.;Taught/discussed recording of test results and interpretation of SMBG.;Interpreting lab values - A1C, lipid, urine microalbumina.;Identified appropriate SMBG and/or A1C goals.    Acute complications Taught treatment of hypoglycemia - the 15 rule.;Discussed and identified patients' treatment of hyperglycemia.    Chronic complications Relationship between chronic complications and blood glucose control;Lipid levels, blood glucose control and heart disease;Applicable immunizations    Psychosocial adjustment Role of stress on diabetes;Identified and addressed patients feelings and concerns about diabetes    Personal strategies to promote health Lifestyle issues that need to be addressed for better diabetes care      Individualized Goals (developed by patient)   Nutrition General guidelines for healthy choices and portions discussed    Physical Activity Exercise 5-7 days per week;30 minutes per day    Medications take my medication as prescribed    Monitoring  test my blood glucose as discussed    Reducing Risk examine blood glucose patterns;increase portions of nuts and seeds;treat hypoglycemia with 15 grams of carbs if blood glucose less than 70mg /dL      Post-Education Assessment   Patient understands the diabetes disease and treatment process. Demonstrates understanding / competency    Patient understands incorporating nutritional management into lifestyle. Demonstrates understanding / competency    Patient undertands incorporating physical activity into lifestyle. Demonstrates understanding / competency    Patient understands using medications safely. Demonstrates understanding / competency    Patient understands monitoring blood glucose, interpreting and using results Needs Review    Patient understands prevention, detection, and treatment of acute complications. Needs  Review    Patient understands prevention, detection, and treatment of chronic complications. Needs Review    Patient understands how to develop strategies to address psychosocial issues. Demonstrates understanding / competency    Patient understands how to develop strategies to promote health/change behavior. Needs Review      Outcomes   Program Status Not Completed           Learning Objective:  Patient will have a greater understanding of diabetes self-management. Patient education plan is to attend individual and/or group sessions per assessed needs and concerns.   Plan:   Patient Instructions  Goals:  Follow Diabetes Meal Plan as instructed  Eat 3 meals and 2 snacks, every 3-5 hrs  Aim to have 1/2 plate non-starchy vegetables, 1/4 plate of lean protein, and 1/4 plate of starch/grain with lunch dinner.   Ex. Salad + grilled chicken + croutons/crackers + water  Ex. Baked chicken + collard greens + spinach + baked potato + water  Breakfast  can be source of carbohydrates and source of protein.   Ex. Oatmeal + egg  Add lean protein foods to meals/snacks  Snacks can include source of carbohydrates + source of protein  Ex. Fruit and nuts  Ex. Trail mix  Ex. Peanut butter crackers  Ex. Cheese and crackers  Monitor glucose levels as instructed by your doctor.   Check blood sugar before breakfast and 2 hours after each meal.   Aim for 30 mins of physical activity daily      Expected Outcomes:  Demonstrated interest in learning. Expect positive outcomes  Education material provided: ADA - How to Thrive: A Guide for Your Journey with Diabetes  If problems or questions, patient to contact team via:  Phone and Email  Future DSME appointment: - 4-6 wks

## 2020-08-14 ENCOUNTER — Ambulatory Visit: Payer: Medicare HMO | Admitting: Registered"

## 2020-08-28 ENCOUNTER — Other Ambulatory Visit: Payer: Self-pay

## 2020-08-28 ENCOUNTER — Ambulatory Visit
Admission: RE | Admit: 2020-08-28 | Discharge: 2020-08-28 | Disposition: A | Discharge status: Home / Self Care | Payer: Medicare HMO | Source: Ambulatory Visit | Attending: Internal Medicine | Admitting: Internal Medicine

## 2020-08-28 DIAGNOSIS — Z1231 Encounter for screening mammogram for malignant neoplasm of breast: Secondary | ICD-10-CM

## 2020-10-04 ENCOUNTER — Other Ambulatory Visit: Payer: Self-pay

## 2020-10-04 ENCOUNTER — Emergency Department (HOSPITAL_COMMUNITY)
Admission: EM | Admit: 2020-10-04 | Discharge: 2020-10-04 | Disposition: A | Discharge status: Home / Self Care | Payer: Medicare HMO | Attending: Emergency Medicine | Admitting: Emergency Medicine

## 2020-10-04 ENCOUNTER — Emergency Department (HOSPITAL_COMMUNITY): Payer: Medicare HMO

## 2020-10-04 ENCOUNTER — Encounter (HOSPITAL_COMMUNITY): Payer: Self-pay | Admitting: *Deleted

## 2020-10-04 DIAGNOSIS — Z7982 Long term (current) use of aspirin: Secondary | ICD-10-CM | POA: Diagnosis not present

## 2020-10-04 DIAGNOSIS — R0602 Shortness of breath: Secondary | ICD-10-CM | POA: Diagnosis not present

## 2020-10-04 DIAGNOSIS — Z87891 Personal history of nicotine dependence: Secondary | ICD-10-CM | POA: Diagnosis not present

## 2020-10-04 DIAGNOSIS — E119 Type 2 diabetes mellitus without complications: Secondary | ICD-10-CM | POA: Diagnosis not present

## 2020-10-04 DIAGNOSIS — Z7984 Long term (current) use of oral hypoglycemic drugs: Secondary | ICD-10-CM | POA: Diagnosis not present

## 2020-10-04 DIAGNOSIS — R0789 Other chest pain: Secondary | ICD-10-CM | POA: Diagnosis not present

## 2020-10-04 DIAGNOSIS — J4520 Mild intermittent asthma, uncomplicated: Secondary | ICD-10-CM | POA: Diagnosis not present

## 2020-10-04 LAB — BASIC METABOLIC PANEL
Anion gap: 11 (ref 5–15)
BUN: 12 mg/dL (ref 8–23)
CO2: 25 mmol/L (ref 22–32)
Calcium: 9.6 mg/dL (ref 8.9–10.3)
Chloride: 103 mmol/L (ref 98–111)
Creatinine, Ser: 0.64 mg/dL (ref 0.44–1.00)
GFR, Estimated: >60 mL/min (ref >60)
Glucose, Bld: 117 mg/dL — ABNORMAL HIGH (ref 70–99)
Potassium: 4.1 mmol/L (ref 3.5–5.1)
Sodium: 139 mmol/L (ref 135–145)

## 2020-10-04 LAB — CBC
HCT: 42.9 % (ref 36.0–46.0)
Hemoglobin: 13.9 g/dL (ref 12.0–15.0)
MCH: 28.5 pg (ref 26.0–34.0)
MCHC: 32.4 g/dL (ref 30.0–36.0)
MCV: 88.1 fL (ref 80.0–100.0)
Platelets: 306 10*3/uL (ref 150–400)
RBC: 4.87 MIL/uL (ref 3.87–5.11)
RDW: 12.6 % (ref 11.5–15.5)
WBC: 7.7 10*3/uL (ref 4.0–10.5)
nRBC: 0 % (ref 0.0–0.2)

## 2020-10-04 LAB — TROPONIN I (HIGH SENSITIVITY)
Troponin I (High Sensitivity): <2 ng/L (ref <18)
Troponin I (High Sensitivity): <2 ng/L (ref <18)

## 2020-10-04 NOTE — Discharge Instructions (Addendum)
I will contact your [facility] cardiology group to see and confirm about having the echocardiogram done and the event monitoring done.  Today's work-up without any acute findings.  Return for any new or worse symptoms.

## 2020-10-04 NOTE — ED Triage Notes (Signed)
Pt complains of intermittent chest pain x 1 week. Also reports left arm feels heavy. Also reports shortness of breath.

## 2020-10-04 NOTE — ED Provider Notes (Signed)
[facility] DEPT Provider Note   CSN: [id number] Arrival date & time: 10/04/20  1052     History Chief Complaint  Patient presents with  . Chest Pain    Amy [name] is a 70 y.o. female.  Patient with a complaint of intermittent chest pain for a week.  Pain is always less than 10 minutes.  Does result in a heavy feeling in her left arm.  Is associated with some shortness of breath.  But this complaint actually has been going on according to cardiology note since about April 19.  Followed by [facility] cardiology.  They had plan for echocardiogram and event monitoring.  According to patient that has not occurred yet she may have canceled that appointment and then just will be scheduled that here recently for in June.  No chest pain currently.  Oxygen saturation on room air is 99 to 100%.        Past Medical History:  Diagnosis Date  . Colon polyp, hyperplastic 03/29/2010  . Diabetes mellitus   . History of kidney stones   . Mild intermittent asthma     Patient Active Problem List   Diagnosis Date Noted  . Allergic rhinitis with a nonallergic component 10/26/2017  . Mild intermittent asthma 10/26/2017  . Allergic conjunctivitis 10/26/2017  . Cough 04/27/2014  . Diabetes ([place]) 08/10/2013  . History of kidney stones   . Colon polyp, hyperplastic 03/29/2010    Past Surgical History:  Procedure Laterality Date  . CYST EXCISION Left 15 years ago   Axillary   . ECTOPIC PREGNANCY SURGERY    . OVARIAN CYST REMOVAL  35 years ago  . TOTAL ABDOMINAL HYSTERECTOMY  1984     OB History    Gravida  3   Para  2   Term  2   Preterm      AB  1   Living  2     SAB  1   IAB      Ectopic      Multiple      Live Births              Family History  Problem Relation Age of Onset  . Diabetes Father   . Diabetes Brother   . Hypertension Mother   . Hypertension Sister   . Vaginal cancer Maternal Grandmother   . Heart disease  Maternal Grandfather   . Heart attack Maternal Grandfather   . Thyroid disease Daughter   . Allergies Daughter   . Allergies Sister     Social History   Tobacco Use  . Smoking status: Former Smoker    Packs/day: 0.50    Years: 12.00    Pack years: 6.00    Types: Cigarettes    Quit date: 05/12/1980    Years since quitting: 40.4  . Smokeless tobacco: Never Used  Vaping Use  . Vaping Use: Never used  Substance Use Topics  . Alcohol use: Yes    Alcohol/week: 0.0 standard drinks    Comment: "Birthdays"  . Drug use: No    Home Medications Prior to Admission medications   Medication Sig Start Date End Date Taking? Authorizing Provider  albuterol (PROAIR HFA) 108 (90 Base) MCG/ACT inhaler Inhale 1-2 puffs into the lungs every 4 (four) hours as needed for wheezing or shortness of breath.  08/26/17   [provider]  aspirin 500 MG EC tablet Take 500-1,000 mg by mouth every 6 (six) hours as needed for  pain.    [provider]  atorvastatin (LIPITOR) 20 MG tablet Take 20 mg by mouth daily. 07/19/20   [provider]  Azelastine HCl 0.15 % SOLN Place 2 sprays into both nostrils 2 (two) times daily. Patient not taking: Reported on 04/08/2019 10/26/17   Bobbitt, Sedalia Muta, MD  BREO ELLIPTA 200-25 MCG/INH AEPB Inhale 1 puff into the lungs daily. 07/11/19   [provider]  Carbinoxamine Maleate 4 MG TABS Take 1 tablet (4 mg total) by mouth every 8 (eight) hours. Patient not taking: Reported on 03/29/2018 10/26/17   Bobbitt, Sedalia Muta, MD  cholecalciferol (VITAMIN D3) 25 MCG (1000 UNIT) tablet Take 1,000 Units by mouth daily.    [provider]  docusate sodium (COLACE) 100 MG capsule Take 100 mg by mouth daily as needed for mild constipation.     [provider]  metFORMIN (GLUCOPHAGE) 1000 MG tablet Take 1,000 mg by mouth every morning. 01/14/19   [provider]  methocarbamol (ROBAXIN) 500 MG tablet Take 1 tablet (500 mg total)  by mouth 2 (two) times daily as needed for muscle spasms. 04/21/20   Sherwood Gambler, MD  montelukast (SINGULAIR) 10 MG tablet Take 1 tablet (10 mg total) by mouth at bedtime. Patient not taking: Reported on 04/08/2019 10/26/17   Bobbitt, Sedalia Muta, MD  pantoprazole (PROTONIX) 20 MG tablet Take 1 tablet (20 mg total) by mouth daily. Patient not taking: Reported on 04/08/2019 03/29/18   Sherwood Gambler, MD  phenylephrine (SUDAFED PE) 10 MG TABS tablet Take 10 mg by mouth every 4 (four) hours as needed (CONGESTION).    [provider]  predniSONE (DELTASONE) 10 MG tablet Take 2 tablets (20 mg total) by mouth 2 (two) times daily with a meal. 07/12/19   Veryl Speak, MD    Allergies    Patient has no known allergies.  Review of Systems   Review of Systems  Constitutional: Negative for chills and fever.  HENT: Negative for rhinorrhea and sore throat.   Eyes: Negative for visual disturbance.  Respiratory: Positive for shortness of breath. Negative for cough.   Cardiovascular: Positive for chest pain. Negative for leg swelling.  Gastrointestinal: Negative for abdominal pain, diarrhea, nausea and vomiting.  Genitourinary: Negative for dysuria.  Musculoskeletal: Negative for back pain and neck pain.  Skin: Negative for rash.  Neurological: Negative for dizziness, light-headedness and headaches.  Hematological: Does not bruise/bleed easily.  Psychiatric/Behavioral: Negative for confusion.    Physical Exam Updated Vital Signs BP 119/77   Pulse 69   Temp 98.5 F (36.9 C) (Oral)   Resp 16   Ht 1.651 m (5\' 5" )   Wt 102.5 kg   LMP 05/12/1982   SpO2 100%   BMI 37.61 kg/m   Physical Exam Vitals and nursing note reviewed.  Constitutional:      General: She is not in acute distress.    Appearance: Normal appearance. She is well-developed.  HENT:     Head: Normocephalic and atraumatic.  Eyes:     Extraocular Movements: Extraocular movements intact.     Conjunctiva/sclera:  Conjunctivae normal.     Pupils: Pupils are equal, round, and reactive to light.  Cardiovascular:     Rate and Rhythm: Normal rate and regular rhythm.     Heart sounds: No murmur heard.   Pulmonary:     Effort: Pulmonary effort is normal. No respiratory distress.     Breath sounds: Normal breath sounds.  Abdominal:     Palpations: Abdomen  is soft.     Tenderness: There is no abdominal tenderness.  Musculoskeletal:        General: No swelling. Normal range of motion.     Cervical back: Neck supple.     Right lower leg: No edema.     Left lower leg: No edema.  Skin:    General: Skin is warm and dry.     Capillary Refill: Capillary refill takes less than 2 seconds.  Neurological:     General: No focal deficit present.     Mental Status: She is alert and oriented to person, place, and time.     Cranial Nerves: No cranial nerve deficit.     Sensory: No sensory deficit.     ED Results / Procedures / Treatments   Labs (all labs ordered are listed, but only abnormal results are displayed) Labs Reviewed  BASIC METABOLIC PANEL - Abnormal; Notable for the following components:      Result Value   Glucose, Bld 117 (*)    All other components within normal limits  CBC  TROPONIN I (HIGH SENSITIVITY)  TROPONIN I (HIGH SENSITIVITY)    EKG EKG Interpretation  Date/Time:  Thursday Oct 04 2020 11:04:13 EDT Ventricular Rate:  75 PR Interval:  198 QRS Duration: 98 QT Interval:  394 QTC Calculation: 441 R Axis:   56 Text Interpretation: Sinus rhythm Low voltage, precordial leads since last tracing no significant change Confirmed by Daleen Bo [phone]) on 10/04/2020 1:56:28 PM   Radiology DG Chest 2 View  Result Date: 10/04/2020 CLINICAL DATA:  Chest pain for 1 week EXAM: CHEST - 2 VIEW COMPARISON:  04/21/2020 FINDINGS: The heart size and mediastinal contours are within normal limits. Both lungs are clear. The visualized skeletal structures show degenerative changes of the  thoracic spine. IMPRESSION: No active cardiopulmonary disease. Electronically Signed   By: Inez Catalina M.D.   On: 10/04/2020 11:52    Procedures Procedures   Medications Ordered in ED Medications - No data to display  ED Course  I have reviewed the triage vital signs and the nursing notes.  Pertinent labs & imaging results that were available during my care of the patient were reviewed by me and considered in my medical decision making (see chart for details).    MDM Rules/Calculators/A&P                          Work-up here chest x-ray negative troponins x2 without any significant abnormalities.  Blood sugar 117 pretty reasonable for having diabetes.  Patient currently with no chest pain.  Describes some shortness of breath oxygen saturations are in the upper 90s to 100%.  Talk to patient about getting back with cardiology to get the event monitoring done in the echocardiogram done.  Patient has had work-ups by them in the past without any significant findings.  Patient has not had a recent cardiac catheterization.       Final Clinical Impression(s) / ED Diagnoses Final diagnoses:  Atypical chest pain    Rx / DC Orders ED Discharge Orders    None       Fredia Sorrow, MD 10/04/20 [phone]

## 2020-10-04 NOTE — ED Provider Notes (Signed)
Emergency Medicine Provider Triage Evaluation Note  Amy [name] 70 y.o. female was evaluated in triage.  Pt complains of chest pain that has been intermittently occurring for 1 week. Feels like sometimes it goes into her arms. No current chest pain. She has had some occasional SOB and cough. No fevers. She has had some nausea and lightheadedness.     Review of Systems  Positive: CP, cough, SOB, nausea, lighthead Negative: Fevers, vomiting   Physical Exam  BP 134/82   Pulse 70   Temp 98.2 F (36.8 C) (Oral)   Resp 18   Ht 5\' 4"  (1.626 m)   Wt 65.8 kg   SpO2 100%   BMI 24.89 kg/m  Gen:   Awake, no distress   HEENT:  Atraumatic  Resp:  Normal effort  Cardiac:  Normal rate. 2+ radial pulse  Abd:   Nondistended, nontender  MSK:   Moves extremities without difficulty  Neuro:  Speech clear   Other:    Medical Decision Making  Medically screening exam initiated at 11:29 AM  Appropriate orders placed.  Amy [name] was informed that the remainder of the evaluation will be completed by another provider, this initial triage assessment does not replace that evaluation. They are counseled that they will need to remain in the ED until the completion of their workup, including full H&P and results of any tests.  Risks of leaving the emergency department prior to completion of treatment were discussed. Patient was advised to inform ED staff if they are leaving before their treatment is complete. The patient acknowledged these risks and time was allowed for questions.     The patient appears stable so that the remainder of the MSE may be completed by another provider.  Clinical Impression  Chest pain   Portions of this note were generated with Dragon dictation software. Dictation errors may occur despite best attempts at proofreading.      Volanda Napoleon, PA-C 10/04/20 1130    Fredia Sorrow, MD 10/04/20 1446

## 2020-12-18 DIAGNOSIS — K649 Unspecified hemorrhoids: Secondary | ICD-10-CM | POA: Insufficient documentation

## 2020-12-24 ENCOUNTER — Ambulatory Visit (INDEPENDENT_AMBULATORY_CARE_PROVIDER_SITE_OTHER): Payer: Medicare HMO | Admitting: Family Medicine

## 2020-12-24 ENCOUNTER — Other Ambulatory Visit: Payer: Self-pay

## 2020-12-24 ENCOUNTER — Encounter: Payer: Self-pay | Admitting: Family Medicine

## 2020-12-24 DIAGNOSIS — N907 Vulvar cyst: Secondary | ICD-10-CM | POA: Diagnosis not present

## 2020-12-24 NOTE — Progress Notes (Signed)
   Subjective:    Patient ID: Amy [name] is a 70 y.o. female presenting with Cyst  on 12/24/2020  HPI: Reports having a bump in her vagina. This has gotten much more tender. Has one on the other side as well. Also notes a skin tag on upper thigh she wants looked at. Notes she has seen MD for same and was told not to do anything unless it got tender. Denies drainage.  Review of Systems  Constitutional:  Negative for chills and fever.  Respiratory:  Negative for shortness of breath.   Cardiovascular:  Negative for chest pain.  Gastrointestinal:  Negative for abdominal pain, nausea and vomiting.  Genitourinary:  Negative for dysuria.  Skin:  Negative for rash.     Objective:    BP 123/81   Pulse 82   Wt 231 lb 8 oz (105 kg)   LMP 05/12/1982   BMI 38.52 kg/m  Physical Exam Constitutional:      General: She is not in acute distress.    Appearance: She is well-developed.  HENT:     Head: Normocephalic and atraumatic.  Eyes:     General: No scleral icterus. Cardiovascular:     Rate and Rhythm: Normal rate.  Pulmonary:     Effort: Pulmonary effort is normal.  Abdominal:     Palpations: Abdomen is soft.  Genitourinary:    Comments: BUS normal, there is a superficial inclusion cyst noted on right labia minora  Musculoskeletal:     Cervical back: Neck supple.  Skin:    General: Skin is warm and dry.  Neurological:     Mental Status: She is alert and oriented to person, place, and time.   Procedure: After informed consent, area cleaned with alcohol.  1.5 cc of Lidocaine with Epinephrine infiltrated.  Scalpel used to make a small incision and sebaceous material removed. Base of lesion treated with AgNO3.    Assessment & Plan:  Sebaceous cyst of labia - s/p drainage and treatment with AgNO3.    Return if symptoms worsen or fail to improve.  Amy [name] 12/24/2020 9:40 AM

## 2020-12-27 DIAGNOSIS — K644 Residual hemorrhoidal skin tags: Secondary | ICD-10-CM | POA: Insufficient documentation

## 2021-06-10 ENCOUNTER — Encounter: Payer: Self-pay | Admitting: Emergency Medicine

## 2021-06-10 ENCOUNTER — Ambulatory Visit (INDEPENDENT_AMBULATORY_CARE_PROVIDER_SITE_OTHER): Payer: Medicare HMO | Admitting: Emergency Medicine

## 2021-06-10 ENCOUNTER — Other Ambulatory Visit: Payer: Self-pay | Admitting: Emergency Medicine

## 2021-06-10 ENCOUNTER — Other Ambulatory Visit: Payer: Self-pay

## 2021-06-10 VITALS — BP 122/70 | HR 76 | Temp 98.6°F | Ht 65.0 in | Wt 226.0 lb

## 2021-06-10 DIAGNOSIS — Z6837 Body mass index (BMI) 37.0-37.9, adult: Secondary | ICD-10-CM | POA: Diagnosis not present

## 2021-06-10 DIAGNOSIS — E1165 Type 2 diabetes mellitus with hyperglycemia: Secondary | ICD-10-CM

## 2021-06-10 DIAGNOSIS — Z7689 Persons encountering health services in other specified circumstances: Secondary | ICD-10-CM | POA: Diagnosis not present

## 2021-06-10 LAB — COMPREHENSIVE METABOLIC PANEL
ALT: 20 U/L (ref 0–35)
AST: 20 U/L (ref 0–37)
Albumin: 4.2 g/dL (ref 3.5–5.2)
Alkaline Phosphatase: 68 U/L (ref 39–117)
BUN: 13 mg/dL (ref 6–23)
CO2: 28 mEq/L (ref 19–32)
Calcium: 9.9 mg/dL (ref 8.4–10.5)
Chloride: 101 mEq/L (ref 96–112)
Creatinine, Ser: 0.62 mg/dL (ref 0.40–1.20)
GFR: 91.31 mL/min (ref >60.00)
Glucose, Bld: 141 mg/dL — ABNORMAL HIGH (ref 70–99)
Potassium: 3.9 mEq/L (ref 3.5–5.1)
Sodium: 139 mEq/L (ref 135–145)
Total Bilirubin: 0.4 mg/dL (ref 0.2–1.2)
Total Protein: 7.7 g/dL (ref 6.0–8.3)

## 2021-06-10 LAB — CBC WITH DIFFERENTIAL/PLATELET
Basophils Absolute: 0 10*3/uL (ref 0.0–0.1)
Basophils Relative: 0.3 % (ref 0.0–3.0)
Eosinophils Absolute: 0.3 10*3/uL (ref 0.0–0.7)
Eosinophils Relative: 3.2 % (ref 0.0–5.0)
HCT: 41.7 % (ref 36.0–46.0)
Hemoglobin: 13.5 g/dL (ref 12.0–15.0)
Lymphocytes Relative: 34.3 % (ref 12.0–46.0)
Lymphs Abs: 2.7 10*3/uL (ref 0.7–4.0)
MCHC: 32.3 g/dL (ref 30.0–36.0)
MCV: 86.9 fl (ref 78.0–100.0)
Monocytes Absolute: 0.5 10*3/uL (ref 0.1–1.0)
Monocytes Relative: 5.9 % (ref 3.0–12.0)
Neutro Abs: 4.4 10*3/uL (ref 1.4–7.7)
Neutrophils Relative %: 56.3 % (ref 43.0–77.0)
Platelets: 301 10*3/uL (ref 150.0–400.0)
RBC: 4.8 Mil/uL (ref 3.87–5.11)
RDW: 13.1 % (ref 11.5–15.5)
WBC: 7.8 10*3/uL (ref 4.0–10.5)

## 2021-06-10 LAB — LIPID PANEL
Cholesterol: 144 mg/dL (ref 0–200)
HDL: 54.6 mg/dL (ref >39.00)
LDL Cholesterol: 63 mg/dL (ref 0–99)
NonHDL: 89.58
Total CHOL/HDL Ratio: 3
Triglycerides: 132 mg/dL (ref 0.0–149.0)
VLDL: 26.4 mg/dL (ref 0.0–40.0)

## 2021-06-10 LAB — MICROALBUMIN / CREATININE URINE RATIO
Creatinine,U: 30.9 mg/dL
Microalb Creat Ratio: 2.3 mg/g (ref 0.0–30.0)
Microalb, Ur: <0.7 mg/dL (ref 0.0–1.9)

## 2021-06-10 LAB — HEMOGLOBIN A1C: Hgb A1c MFr Bld: 7.6 % — ABNORMAL HIGH (ref 4.6–6.5)

## 2021-06-10 MED ORDER — RYBELSUS 7 MG PO TABS: 7.0000 mg | ORAL_TABLET | Freq: Every day | ORAL | 3 refills | Status: AC

## 2021-06-10 NOTE — Assessment & Plan Note (Signed)
Last hemoglobin A1c at 7.15 April 2021 Repeated today. Diet and nutrition discussed. Continue metformin 1000 mg twice a day Follow-up in 3 months.

## 2021-06-10 NOTE — Patient Instructions (Signed)

## 2021-06-10 NOTE — Progress Notes (Signed)
Amy [name] 71 y.o.   Chief Complaint  Patient presents with   New Patient (Initial Visit)    Manage diabetes and cholesterol, would like to discuss medication to attack cancer, heard about it on TV    HISTORY OF PRESENT ILLNESS: This is a 71 y.o. female first visit to this office here to establish care with me. Has a history of diabetes and dyslipidemia on metformin and atorvastatin. No other complaints or medical concerns today.   HPI   Prior to Admission medications   Medication Sig Start Date End Date Taking? Authorizing Provider  albuterol (VENTOLIN HFA) 108 (90 Base) MCG/ACT inhaler Inhale 1-2 puffs into the lungs every 4 (four) hours as needed for wheezing or shortness of breath.  08/26/17  Yes [provider]  ASPIRIN 81 PO Take by mouth.   Yes [provider]  atorvastatin (LIPITOR) 20 MG tablet Take 20 mg by mouth daily. 07/19/20  Yes [provider]  BREO ELLIPTA 200-25 MCG/INH AEPB Inhale 1 puff into the lungs daily. 07/11/19  Yes [provider]  cholecalciferol (VITAMIN D3) 25 MCG (1000 UNIT) tablet Take 1,000 Units by mouth daily.   Yes [provider]  docusate sodium (COLACE) 100 MG capsule Take 100 mg by mouth daily as needed for mild constipation.    Yes [provider]  metFORMIN (GLUCOPHAGE) 1000 MG tablet Take 1,000 mg by mouth every morning. 01/14/19  Yes [provider]  methocarbamol (ROBAXIN) 500 MG tablet Take 1 tablet (500 mg total) by mouth 2 (two) times daily as needed for muscle spasms. 04/21/20  Yes Sherwood Gambler, MD    No Known Allergies  Patient Active Problem List   Diagnosis Date Noted   Anal skin tag 12/27/2020   Hemorrhoids 12/18/2020   Abnormal EKG 04/27/2019   Medicare annual wellness visit, subsequent 05/17/2018   Need for vaccination 05/17/2018   Allergic rhinitis with a nonallergic component 10/26/2017   Mild intermittent asthma 10/26/2017   Allergic conjunctivitis  10/26/2017   Morbid (severe) obesity due to excess calories ([place]) 09/29/2016   Hordeolum 07/08/2016   Cough 04/27/2014   Diabetes ([place]) 08/10/2013   History of kidney stones    Colon polyp, hyperplastic 03/29/2010    Past Medical History:  Diagnosis Date   Colon polyp, hyperplastic 03/29/2010   Diabetes mellitus    History of kidney stones    Mild intermittent asthma     Past Surgical History:  Procedure Laterality Date   CYST EXCISION Left 15 years ago   Axillary    ECTOPIC PREGNANCY SURGERY     OVARIAN CYST REMOVAL  35 years ago   TOTAL ABDOMINAL HYSTERECTOMY  1984    Social History   Socioeconomic History   Marital status: Married    Spouse name: Not on file   Number of children: 2   Years of education: Not on file   Highest education level: Not on file  Occupational History    Employer: [employer]  Tobacco Use   Smoking status: Former    Packs/day: 0.50    Years: 12.00    Pack years: 6.00    Types: Cigarettes    Quit date: 05/12/1980    Years since quitting: 41.1   Smokeless tobacco: Never  Vaping Use   Vaping Use: Never used  Substance and Sexual Activity   Alcohol use: Yes    Alcohol/week: 0.0 standard drinks    Comment: "Birthdays"   Drug use: No   Sexual  activity: Yes    Partners: Male    Birth control/protection: Surgical    Comment: Hysterectomy  Other Topics Concern   Not on file  Social History Narrative   Not on file   Social Determinants of Health   Financial Resource Strain: Not on file  Food Insecurity: Not on file  Transportation Needs: Not on file  Physical Activity: Not on file  Stress: Not on file  Social Connections: Not on file  Intimate Partner Violence: Not on file    Family History  Problem Relation Age of Onset   Stomach cancer Maternal Grandmother    Heart disease Maternal Grandfather    Heart attack Maternal Grandfather    Diabetes Father    Hypertension Mother    Diabetes Brother    Hypertension  Sister    Allergies Sister    Allergies Daughter      Review of Systems  Constitutional: Negative.  Negative for chills and fever.  HENT: Negative.  Negative for congestion and sore throat.   Respiratory: Negative.  Negative for cough and shortness of breath.   Cardiovascular: Negative.  Negative for chest pain and palpitations.  Gastrointestinal:  Negative for abdominal pain, diarrhea, nausea and vomiting.  Genitourinary: Negative.   Skin: Negative.  Negative for rash.  Neurological: Negative.  Negative for dizziness and headaches.  All other systems reviewed and are negative.  Today's Vitals   06/10/21 1101  BP: 122/70  Pulse: 76  Temp: 98.6 F (37 C)  TempSrc: Oral  SpO2: 95%  Weight: 226 lb (102.5 kg)  Height: 5\' 5"  (1.651 m)   Body mass index is 37.61 kg/m.  Physical Exam Vitals reviewed.  Constitutional:      Appearance: Normal appearance.  HENT:     Head: Normocephalic.  Eyes:     Extraocular Movements: Extraocular movements intact.     Conjunctiva/sclera: Conjunctivae normal.     Pupils: Pupils are equal, round, and reactive to light.  Cardiovascular:     Rate and Rhythm: Normal rate and regular rhythm.     Pulses: Normal pulses.     Heart sounds: Normal heart sounds.  Pulmonary:     Effort: Pulmonary effort is normal.     Breath sounds: Normal breath sounds.  Musculoskeletal:     Cervical back: Normal range of motion and neck supple.  Skin:    General: Skin is warm and dry.     Capillary Refill: Capillary refill takes less than 2 seconds.  Neurological:     General: No focal deficit present.     Mental Status: She is alert and oriented to person, place, and time.  Psychiatric:        Mood and Affect: Mood normal.        Behavior: Behavior normal.     ASSESSMENT & PLAN: A total of 50 minutes was spent with the patient and counseling/coordination of care regarding preparing for this visit, review of most recent office visit notes, review of most  recent blood work results, review of all medications, review of past medical history and problem list, comprehensive history and physical examination, education on nutrition, cardiovascular risks associated with diabetes, documentation, prognosis and need for follow-up in 3 months.  Problem List Items Addressed This Visit       Endocrine   Diabetes ([place]) - Primary    Last hemoglobin A1c at 7.15 April 2021 Repeated today. Diet and nutrition discussed. Continue metformin 1000 mg twice a day Follow-up in 3 months.  Relevant Medications   ASPIRIN 81 PO   Other Relevant Orders   CBC with Differential/Platelet   Comprehensive metabolic panel   Hemoglobin A1c   Lipid panel   Urine Microalbumin w/creat. ratio   Other Visit Diagnoses     Encounter to establish care       Body mass index (BMI) of 37.0-37.9 in adult          Patient Instructions  Diabetes Mellitus and Nutrition, Adult When you have diabetes, or diabetes mellitus, it is very important to have healthy eating habits because your blood sugar (glucose) levels are greatly affected by what you eat and drink. Eating healthy foods in the right amounts, at about the same times every day, can help you: Manage your blood glucose. Lower your risk of heart disease. Improve your blood pressure. Reach or maintain a healthy weight. What can affect my meal plan? Every person with diabetes is different, and each person has different needs for a meal plan. Your health care provider may recommend that you work with a dietitian to make a meal plan that is best for you. Your meal plan may vary depending on factors such as: The calories you need. The medicines you take. Your weight. Your blood glucose, blood pressure, and cholesterol levels. Your activity level. Other health conditions you have, such as heart or kidney disease. How do carbohydrates affect me? Carbohydrates, also called carbs, affect your blood glucose level more  than any other type of food. Eating carbs raises the amount of glucose in your blood. It is important to know how many carbs you can safely have in each meal. This is different for every person. Your dietitian can help you calculate how many carbs you should have at each meal and for each snack. How does alcohol affect me? Alcohol can cause a decrease in blood glucose (hypoglycemia), especially if you use insulin or take certain diabetes medicines by mouth. Hypoglycemia can be a life-threatening condition. Symptoms of hypoglycemia, such as sleepiness, dizziness, and confusion, are similar to symptoms of having too much alcohol. Do not drink alcohol if: Your health care provider tells you not to drink. You are pregnant, may be pregnant, or are planning to become pregnant. If you drink alcohol: Limit how much you have to: 0-1 drink a day for women. 0-2 drinks a day for men. Know how much alcohol is in your drink. In the [place] one drink equals one 12 oz bottle of beer (355 mL), one 5 oz glass of wine (148 mL), or one 1 oz glass of hard liquor (44 mL). Keep yourself hydrated with water, diet soda, or unsweetened iced tea. Keep in mind that regular soda, juice, and other mixers may contain a lot of sugar and must be counted as carbs. What are tips for following this plan? Reading food labels Start by checking the serving size on the Nutrition Facts label of packaged foods and drinks. The number of calories and the amount of carbs, fats, and other nutrients listed on the label are based on one serving of the item. Many items contain more than one serving per package. Check the total grams (g) of carbs in one serving. Check the number of grams of saturated fats and trans fats in one serving. Choose foods that have a low amount or none of these fats. Check the number of milligrams (mg) of salt (sodium) in one serving. Most people should limit total sodium intake to less than 2,300 mg per day. Always  check the nutrition information of foods labeled as "low-fat" or "nonfat." These foods may be higher in added sugar or refined carbs and should be avoided. Talk to your dietitian to identify your daily goals for nutrients listed on the label. Shopping Avoid buying canned, pre-made, or processed foods. These foods tend to be high in fat, sodium, and added sugar. Shop around the outside edge of the grocery store. This is where you will most often find fresh fruits and vegetables, bulk grains, fresh meats, and fresh dairy products. Cooking Use low-heat cooking methods, such as baking, instead of high-heat cooking methods, such as deep frying. Cook using healthy oils, such as olive, canola, or sunflower oil. Avoid cooking with butter, cream, or high-fat meats. Meal planning Eat meals and snacks regularly, preferably at the same times every day. Avoid going long periods of time without eating. Eat foods that are high in fiber, such as fresh fruits, vegetables, beans, and whole grains. Eat 4-6 oz (112-168 g) of lean protein each day, such as lean meat, chicken, fish, eggs, or tofu. One ounce (oz) (28 g) of lean protein is equal to: 1 oz (28 g) of meat, chicken, or fish. 1 egg.  cup (62 g) of tofu. Eat some foods each day that contain healthy fats, such as avocado, nuts, seeds, and fish. What foods should I eat? Fruits Berries. Apples. Oranges. Peaches. Apricots. Plums. Grapes. Mangoes. Papayas. Pomegranates. Kiwi. Cherries. Vegetables Leafy greens, including lettuce, spinach, kale, chard, collard greens, mustard greens, and cabbage. Beets. Cauliflower. Broccoli. Carrots. Green beans. Tomatoes. Peppers. Onions. Cucumbers. Brussels sprouts. Grains Whole grains, such as whole-wheat or whole-grain bread, crackers, tortillas, cereal, and pasta. Unsweetened oatmeal. Quinoa. Brown or wild rice. Meats and other proteins Seafood. Poultry without skin. Lean cuts of poultry and beef. Tofu. Nuts.  Seeds. Dairy Low-fat or fat-free dairy products such as milk, yogurt, and cheese. The items listed above may not be a complete list of foods and beverages you can eat and drink. Contact a dietitian for more information. What foods should I avoid? Fruits Fruits canned with syrup. Vegetables Canned vegetables. Frozen vegetables with butter or cream sauce. Grains Refined white flour and flour products such as bread, pasta, snack foods, and cereals. Avoid all processed foods. Meats and other proteins Fatty cuts of meat. Poultry with skin. Breaded or fried meats. Processed meat. Avoid saturated fats. Dairy Full-fat yogurt, cheese, or milk. Beverages Sweetened drinks, such as soda or iced tea. The items listed above may not be a complete list of foods and beverages you should avoid. Contact a dietitian for more information. Questions to ask a health care provider Do I need to meet with a certified diabetes care and education specialist? Do I need to meet with a dietitian? What number can I call if I have questions? When are the best times to check my blood glucose? Where to find more information: American Diabetes Association: diabetes.org Academy of Nutrition and Dietetics: eatright.[employer] of Diabetes and Digestive and Kidney Diseases: [employer] Association of Diabetes Care & Education Specialists: diabeteseducator.org Summary It is important to have healthy eating habits because your blood sugar (glucose) levels are greatly affected by what you eat and drink. It is important to use alcohol carefully. A healthy meal plan will help you manage your blood glucose and lower your risk of heart disease. Your health care provider may recommend that you work with a dietitian to make a meal plan that is best for you. This information is not intended  to replace advice given to you by your health care provider. Make sure you discuss any questions you have with your health care  provider. Document Revised: 11/30/2019 Document Reviewed: 11/30/2019 Elsevier Patient Education  2022 [place], MD [facility]

## 2021-06-17 ENCOUNTER — Telehealth: Payer: Self-pay | Admitting: Emergency Medicine

## 2021-06-17 NOTE — Telephone Encounter (Signed)
Pt checking status of results of 06-10-2021 lab results, informed pt of provider's 06-10-2021 lab result note  Pt has additional questions regarding rybelsus, pt requesting a c/b

## 2021-06-18 NOTE — Telephone Encounter (Signed)
Called pt, husband answered the phone states pt wasn't available. Left a message to call back regarding questions she had about diabetic medication.

## 2021-08-02 ENCOUNTER — Other Ambulatory Visit: Payer: Self-pay | Admitting: Internal Medicine

## 2021-08-02 DIAGNOSIS — Z1231 Encounter for screening mammogram for malignant neoplasm of breast: Secondary | ICD-10-CM

## 2021-08-29 ENCOUNTER — Ambulatory Visit: Payer: Medicare HMO

## 2021-09-06 ENCOUNTER — Ambulatory Visit
Admission: RE | Admit: 2021-09-06 | Discharge: 2021-09-06 | Disposition: A | Discharge status: Home / Self Care | Payer: Medicare HMO | Source: Ambulatory Visit | Attending: Internal Medicine | Admitting: Internal Medicine

## 2021-09-06 DIAGNOSIS — Z1231 Encounter for screening mammogram for malignant neoplasm of breast: Secondary | ICD-10-CM

## 2021-09-09 ENCOUNTER — Encounter: Payer: Self-pay | Admitting: Emergency Medicine

## 2021-09-09 ENCOUNTER — Ambulatory Visit (INDEPENDENT_AMBULATORY_CARE_PROVIDER_SITE_OTHER): Payer: Medicare HMO | Admitting: Emergency Medicine

## 2021-09-09 VITALS — BP 128/72 | HR 71 | Temp 98.4°F | Ht 65.0 in | Wt 231.5 lb

## 2021-09-09 DIAGNOSIS — E1165 Type 2 diabetes mellitus with hyperglycemia: Secondary | ICD-10-CM

## 2021-09-09 LAB — POCT GLYCOSYLATED HEMOGLOBIN (HGB A1C): Hemoglobin A1C: 7.1 % — AB (ref 4.0–5.6)

## 2021-09-09 NOTE — Patient Instructions (Signed)
Health Maintenance After Age 69 After age 69, you are at a higher risk for certain long-term diseases and infections as well as injuries from falls. Falls are a major cause of broken bones and head injuries in people who are older than age 69. Getting regular preventive care can help to keep you healthy and well. Preventive care includes getting regular testing and making lifestyle changes as recommended by your health care provider. Talk with your health care provider about: Which screenings and tests you should have. A screening is a test that checks for a disease when you have no symptoms. A diet and exercise plan that is right for you. What should I know about screenings and tests to prevent falls? Screening and testing are the best ways to find a health problem early. Early diagnosis and treatment give you the best chance of managing medical conditions that are common after age 69. Certain conditions and lifestyle choices may make you more likely to have a fall. Your health care provider may recommend: Regular vision checks. Poor vision and conditions such as cataracts can make you more likely to have a fall. If you wear glasses, make sure to get your prescription updated if your vision changes. Medicine review. Work with your health care provider to regularly review all of the medicines you are taking, including over-the-counter medicines. Ask your health care provider about any side effects that may make you more likely to have a fall. Tell your health care provider if any medicines that you take make you feel dizzy or sleepy. Strength and balance checks. Your health care provider may recommend certain tests to check your strength and balance while standing, walking, or changing positions. Foot health exam. Foot pain and numbness, as well as not wearing proper footwear, can make you more likely to have a fall. Screenings, including: Osteoporosis screening. Osteoporosis is a condition that causes  the bones to get weaker and break more easily. Blood pressure screening. Blood pressure changes and medicines to control blood pressure can make you feel dizzy. Depression screening. You may be more likely to have a fall if you have a fear of falling, feel depressed, or feel unable to do activities that you used to do. Alcohol use screening. Using too much alcohol can affect your balance and may make you more likely to have a fall. Follow these instructions at home: Lifestyle Do not drink alcohol if: Your health care provider tells you not to drink. If you drink alcohol: Limit how much you have to: 0-1 drink a day for women. 0-2 drinks a day for men. Know how much alcohol is in your drink. In the [place] one drink equals one 12 oz bottle of beer (355 mL), one 5 oz glass of wine (148 mL), or one 1 oz glass of hard liquor (44 mL). Do not use any products that contain nicotine or tobacco. These products include cigarettes, chewing tobacco, and vaping devices, such as e-cigarettes. If you need help quitting, ask your health care provider. Activity  Follow a regular exercise program to stay fit. This will help you maintain your balance. Ask your health care provider what types of exercise are appropriate for you. If you need a cane or walker, use it as recommended by your health care provider. Wear supportive shoes that have nonskid soles. Safety  Remove any tripping hazards, such as rugs, cords, and clutter. Install safety equipment such as grab bars in bathrooms and safety rails on stairs. Keep rooms and walkways   well-lit. General instructions Talk with your health care provider about your risks for falling. Tell your health care provider if: You fall. Be sure to tell your health care provider about all falls, even ones that seem minor. You feel dizzy, tiredness (fatigue), or off-balance. Take over-the-counter and prescription medicines only as told by your health care provider. These include  supplements. Eat a healthy diet and maintain a healthy weight. A healthy diet includes low-fat dairy products, low-fat (lean) meats, and fiber from whole grains, beans, and lots of fruits and vegetables. Stay current with your vaccines. Schedule regular health, dental, and eye exams. Summary Having a healthy lifestyle and getting preventive care can help to protect your health and wellness after age 69. Screening and testing are the best way to find a health problem early and help you avoid having a fall. Early diagnosis and treatment give you the best chance for managing medical conditions that are more common for people who are older than age 69. Falls are a major cause of broken bones and head injuries in people who are older than age 69. Take precautions to prevent a fall at home. Work with your health care provider to learn what changes you can make to improve your health and wellness and to prevent falls. This information is not intended to replace advice given to you by your health care provider. Make sure you discuss any questions you have with your health care provider. Document Revised: 09/17/2020 Document Reviewed: 09/17/2020 Elsevier Patient Education  2023 [employer] Inc.  

## 2021-09-09 NOTE — Progress Notes (Signed)
Amy [name] ?71 y.o. ? ? ?Chief Complaint  ?Patient presents with  ? Follow-up  ?  No concerns  ? ? ?HISTORY OF PRESENT ILLNESS: ?This is a 71 y.o. female with history of diabetes here for follow-up.  Presently on metformin 1000 mg twice a day without any side effects.  Eating better. ?Has no complaints or medical concerns today. ? ?HPI ? ? ?Prior to Admission medications   ?Medication Sig Start Date End Date Taking? Authorizing Provider  ?albuterol (VENTOLIN HFA) 108 (90 Base) MCG/ACT inhaler Inhale 1-2 puffs into the lungs every 4 (four) hours as needed for wheezing or shortness of breath.  08/26/17  Yes [provider]  ?ASPIRIN 81 PO Take by mouth.   Yes [provider]  ?atorvastatin (LIPITOR) 20 MG tablet Take 20 mg by mouth daily. 07/19/20  Yes [provider]  ?[place] Patter 200-25 MCG/INH AEPB Inhale 1 puff into the lungs daily. 07/11/19  Yes [provider]  ?cholecalciferol (VITAMIN D3) 25 MCG (1000 UNIT) tablet Take 1,000 Units by mouth daily.   Yes [provider]  ?docusate sodium (COLACE) 100 MG capsule Take 100 mg by mouth daily as needed for mild constipation.    Yes [provider]  ?metFORMIN (GLUCOPHAGE) 1000 MG tablet Take 1,000 mg by mouth every morning. 01/14/19  Yes [provider]  ?methocarbamol (ROBAXIN) 500 MG tablet Take 1 tablet (500 mg total) by mouth 2 (two) times daily as needed for muscle spasms. 04/21/20  Yes Sherwood Gambler, MD  ? ? ?No Known Allergies ? ?Patient Active Problem List  ? Diagnosis Date Noted  ? Hemorrhoids 12/18/2020  ? Abnormal EKG 04/27/2019  ? Mild intermittent asthma 10/26/2017  ? Morbid (severe) obesity due to excess calories ([place]) 09/29/2016  ? Diabetes ([place]) 08/10/2013  ? History of kidney stones   ? Colon polyp, hyperplastic 03/29/2010  ? ? ?Past Medical History:  ?Diagnosis Date  ? Colon polyp, hyperplastic 03/29/2010  ? Diabetes mellitus   ? History of kidney stones   ? Mild intermittent asthma    ? ? ?Past Surgical History:  ?Procedure Laterality Date  ? CYST EXCISION Left 15 years ago  ? Axillary   ? ECTOPIC PREGNANCY SURGERY    ? OVARIAN CYST REMOVAL  35 years ago  ? TOTAL ABDOMINAL HYSTERECTOMY  1984  ? ? ?Social History  ? ?Socioeconomic History  ? Marital status: Married  ?  Spouse name: Not on file  ? Number of children: 2  ? Years of education: Not on file  ? Highest education level: Not on file  ?Occupational History  ?  Employer: [employer]  ?Tobacco Use  ? Smoking status: Former  ?  Packs/day: 0.50  ?  Years: 12.00  ?  Pack years: 6.00  ?  Types: Cigarettes  ?  Quit date: 05/12/1980  ?  Years since quitting: 41.3  ? Smokeless tobacco: Never  ?Vaping Use  ? Vaping Use: Never used  ?Substance and Sexual Activity  ? Alcohol use: Yes  ?  Alcohol/week: 0.0 standard drinks  ?  Comment: "Birthdays"  ? Drug use: No  ? Sexual activity: Yes  ?  Partners: Male  ?  Birth control/protection: Surgical  ?  Comment: Hysterectomy  ?Other Topics Concern  ? Not on file  ?Social History Narrative  ? Not on file  ? ?Social Determinants of Health  ? ?Financial Resource Strain: Not on file  ?Food Insecurity: Not on file  ?Transportation Needs: Not  on file  ?Physical Activity: Not on file  ?Stress: Not on file  ?Social Connections: Not on file  ?Intimate Partner Violence: Not on file  ? ? ?Family History  ?Problem Relation Age of Onset  ? Stomach cancer Maternal Grandmother   ? Heart disease Maternal Grandfather   ? Heart attack Maternal Grandfather   ? Diabetes Father   ? Hypertension Mother   ? Diabetes Brother   ? Hypertension Sister   ? Allergies Sister   ? Allergies Daughter   ? ? ? ?Review of Systems  ?Constitutional: Negative.  Negative for chills and fever.  ?HENT: Negative.  Negative for congestion and sore throat.   ?Respiratory: Negative.  Negative for cough and shortness of breath.   ?Cardiovascular: Negative.  Negative for chest pain and palpitations.  ?Gastrointestinal: Negative.  Negative for  abdominal pain, diarrhea, nausea and vomiting.  ?Genitourinary: Negative.  Negative for dysuria and hematuria.  ?Skin: Negative.  Negative for rash.  ?Neurological:  Negative for dizziness and headaches.  ?All other systems reviewed and are negative. ? ?Today's Vitals  ? 09/09/21 1028  ?BP: 128/72  ?Pulse: 71  ?Temp: 98.4 ?F (36.9 ?C)  ?TempSrc: Oral  ?SpO2: 96%  ?Weight: 231 lb 8 oz (105 kg)  ?Height: '5\' 5"'$  (1.651 m)  ? ?Body mass index is 38.52 kg/m?. ?Wt Readings from Last 3 Encounters:  ?09/09/21 231 lb 8 oz (105 kg)  ?06/10/21 226 lb (102.5 kg)  ?12/24/20 231 lb 8 oz (105 kg)  ? ? ?Physical Exam ?Vitals reviewed.  ?Constitutional:   ?   Appearance: Normal appearance.  ?HENT:  ?   Head: Normocephalic.  ?Eyes:  ?   Extraocular Movements: Extraocular movements intact.  ?   Pupils: Pupils are equal, round, and reactive to light.  ?Cardiovascular:  ?   Rate and Rhythm: Normal rate and regular rhythm.  ?   Pulses: Normal pulses.  ?   Heart sounds: Normal heart sounds.  ?Pulmonary:  ?   Effort: Pulmonary effort is normal.  ?   Breath sounds: Normal breath sounds.  ?Musculoskeletal:  ?   Cervical back: No tenderness.  ?   Right lower leg: No edema.  ?   Left lower leg: No edema.  ?Lymphadenopathy:  ?   Cervical: No cervical adenopathy.  ?Skin: ?   General: Skin is warm and dry.  ?   Capillary Refill: Capillary refill takes less than 2 seconds.  ?Neurological:  ?   General: No focal deficit present.  ?   Mental Status: She is alert and oriented to person, place, and time.  ?Psychiatric:     ?   Mood and Affect: Mood normal.     ?   Behavior: Behavior normal.  ? ? ?Results for orders placed or performed in visit on 09/09/21 (from the past 24 hour(s))  ?POCT HgB A1C     Status: Abnormal  ? Collection Time: 09/09/21 10:41 AM  ?Result Value Ref Range  ? Hemoglobin A1C 7.1 (A) 4.0 - 5.6 %  ? HbA1c POC (<> result, manual entry)    ? HbA1c, POC (prediabetic range)    ? HbA1c, POC (controlled diabetic range)    ? ? ?ASSESSMENT  & PLAN: ?Problem List Items Addressed This Visit   ? ?  ? Endocrine  ? Diabetes ([place]) - Primary  ?  Well-controlled diabetes with hemoglobin A1c better than before at 7.1. ?Continue metformin 1000 mg twice a day. ?Diet and nutrition discussed. ?Patient does not want  to start any other medication. ?Follow-up in 6 months. ? ?  ?  ? Relevant Orders  ? POCT HgB A1C (Completed)  ? ?Patient Instructions  ?Health Maintenance After Age 66 ?After age 52, you are at a higher risk for certain long-term diseases and infections as well as injuries from falls. Falls are a major cause of broken bones and head injuries in people who are older than age 57. Getting regular preventive care can help to keep you healthy and well. Preventive care includes getting regular testing and making lifestyle changes as recommended by your health care provider. Talk with your health care provider about: ?Which screenings and tests you should have. A screening is a test that checks for a disease when you have no symptoms. ?A diet and exercise plan that is right for you. ?What should I know about screenings and tests to prevent falls? ?Screening and testing are the best ways to find a health problem early. Early diagnosis and treatment give you the best chance of managing medical conditions that are common after age 59. Certain conditions and lifestyle choices may make you more likely to have a fall. Your health care provider may recommend: ?Regular vision checks. Poor vision and conditions such as cataracts can make you more likely to have a fall. If you wear glasses, make sure to get your prescription updated if your vision changes. ?Medicine review. Work with your health care provider to regularly review all of the medicines you are taking, including over-the-counter medicines. Ask your health care provider about any side effects that may make you more likely to have a fall. Tell your health care provider if any medicines that you take make you  feel dizzy or sleepy. ?Strength and balance checks. Your health care provider may recommend certain tests to check your strength and balance while standing, walking, or changing positions. ?Foot health ex

## 2021-09-09 NOTE — Assessment & Plan Note (Signed)
Well-controlled diabetes with hemoglobin A1c better than before at 7.1. ?Continue metformin 1000 mg twice a day. ?Diet and nutrition discussed. ?Patient does not want to start any other medication. ?Follow-up in 6 months. ?

## 2021-09-16 ENCOUNTER — Encounter: Payer: Self-pay | Admitting: Internal Medicine

## 2021-09-16 ENCOUNTER — Ambulatory Visit (INDEPENDENT_AMBULATORY_CARE_PROVIDER_SITE_OTHER): Payer: Medicare HMO | Admitting: Internal Medicine

## 2021-09-16 VITALS — BP 120/78 | HR 88 | Temp 98.0°F | Ht 65.0 in | Wt 223.0 lb

## 2021-09-16 DIAGNOSIS — J4531 Mild persistent asthma with (acute) exacerbation: Secondary | ICD-10-CM

## 2021-09-16 MED ORDER — AZITHROMYCIN 250 MG PO TABS: ORAL_TABLET | ORAL | 0 refills | Status: DC

## 2021-09-16 MED ORDER — PREDNISONE 20 MG PO TABS: 20.0000 mg | ORAL_TABLET | Freq: Every day | ORAL | 0 refills | Status: DC

## 2021-09-16 MED ORDER — FLUTICASONE FUROATE-VILANTEROL 200-25 MCG/ACT IN AEPB: 1.0000 | INHALATION_SPRAY | Freq: Every day | RESPIRATORY_TRACT | 11 refills | Status: DC

## 2021-09-16 NOTE — Patient Instructions (Addendum)
? ? ? ? ?  Medications changes include :   zpak, prednisone 20 mg daily x 5 days ? ? ? ?Your prescription(s) have been sent to your pharmacy.  ? ? ? ? ?Return if symptoms worsen or fail to improve. ? ?

## 2021-09-16 NOTE — Progress Notes (Signed)
? ? ?Subjective:  ? ? Patient ID: Amy [name], female    DOB: 1950-11-07, 71 y.o.   MRN: [id number] ? ? ? ? ? ?HPI ?Kaithlyn is here for  ?Chief Complaint  ?Patient presents with  ? Nasal Congestion  ?  Congestion x 3 days  ? ? ?She is here for an acute visit for cold symptoms.  ? ?Her symptoms started over one week ago.   ? ?She is experiencing sweats, nasal congestion, postnasal drainage, cough that is productive of thick mucus that is discolored, some shortness of breath, wheezing, headaches and lightheadedness.  She is unsure if she has had any fever, but did have the sweating and figured she may have had a fever.  She denies sinus pain or pressure.  She is most concerned about the amount of mucus she has and how thick it is.  She is having difficulty getting up and feels like she is choking at times. ? ?She has tried taking theraflu, her inhaler-Breo ? ? ? ? ? ?Medications and allergies reviewed with patient and updated if appropriate. ? ?Current Outpatient Medications on File Prior to Visit  ?Medication Sig Dispense Refill  ? albuterol (VENTOLIN HFA) 108 (90 Base) MCG/ACT inhaler Inhale 1-2 puffs into the lungs every 4 (four) hours as needed for wheezing or shortness of breath.     ? ASPIRIN 81 PO Take by mouth.    ? atorvastatin (LIPITOR) 20 MG tablet Take 20 mg by mouth daily.    ? BREO ELLIPTA 200-25 MCG/INH AEPB Inhale 1 puff into the lungs daily.    ? cholecalciferol (VITAMIN D3) 25 MCG (1000 UNIT) tablet Take 1,000 Units by mouth daily.    ? docusate sodium (COLACE) 100 MG capsule Take 100 mg by mouth daily as needed for mild constipation.     ? metFORMIN (GLUCOPHAGE) 1000 MG tablet Take 1,000 mg by mouth every morning.    ? methocarbamol (ROBAXIN) 500 MG tablet Take 1 tablet (500 mg total) by mouth 2 (two) times daily as needed for muscle spasms. 10 tablet 0  ? Na Sulfate-K Sulfate-Mg Sulf 17.5-3.13-1.6 GM/177ML SOLN TAKE 177 MLS BY MOUTH 2 (TWO) TIMES FOR 2 DOSES. Oral for 1 Days    ? ?No current  facility-administered medications on file prior to visit.  ? ? ?Review of Systems  ?Constitutional:  Positive for diaphoresis. Negative for fever.  ?HENT:  Positive for congestion and postnasal drip. Negative for ear pain, sinus pressure, sinus pain and sore throat.   ?Respiratory:  Positive for cough (thick mucus), shortness of breath and wheezing.   ?Gastrointestinal:  Positive for nausea.  ?Musculoskeletal:  Positive for myalgias.  ?Neurological:  Positive for light-headedness and headaches.  ? ?   ?Objective:  ? ?Vitals:  ? 09/16/21 1412  ?BP: 120/78  ?Pulse: 88  ?Temp: 98 ?F (36.7 ?C)  ?SpO2: 98%  ? ?BP Readings from Last 3 Encounters:  ?09/16/21 120/78  ?09/09/21 128/72  ?06/10/21 122/70  ? ?Wt Readings from Last 3 Encounters:  ?09/16/21 223 lb (101.2 kg)  ?09/09/21 231 lb 8 oz (105 kg)  ?06/10/21 226 lb (102.5 kg)  ? ?Body mass index is 37.11 kg/m?. ? ?  ?Physical Exam ?Constitutional:   ?   General: She is not in acute distress. ?   Appearance: Normal appearance. She is not ill-appearing.  ?HENT:  ?   Head: Normocephalic and atraumatic.  ?   Right Ear: Tympanic membrane, ear canal and external ear normal.  ?  Left Ear: Tympanic membrane, ear canal and external ear normal.  ?   Mouth/Throat:  ?   Mouth: Mucous membranes are moist.  ?   Pharynx: No oropharyngeal exudate or posterior oropharyngeal erythema.  ?Eyes:  ?   Conjunctiva/sclera: Conjunctivae normal.  ?Cardiovascular:  ?   Rate and Rhythm: Normal rate and regular rhythm.  ?Pulmonary:  ?   Effort: Pulmonary effort is normal. No respiratory distress.  ?   Breath sounds: Normal breath sounds. No wheezing or rales.  ?Musculoskeletal:  ?   Cervical back: Neck supple. No tenderness.  ?Lymphadenopathy:  ?   Cervical: No cervical adenopathy.  ?Skin: ?   General: Skin is warm and dry.  ?Neurological:  ?   Mental Status: She is alert.  ? ?   ? ? ? ? ? ?Assessment & Plan:  ? ? ?Asthma exacerbation-mild, persistent: ?Acute ?Related to URI ?Having productive  cough with thick sputum that has been discolored, shortness of breath and wheezing ?Using [place] daily, which is helping ?Start prednisone 20 mg daily x5 days-unfortunately I think she does need some steroids given the above symptoms and her symptoms not progressing with the use of Breo ?Can use albuterol as needed ? ?URI: ?Acute ?Given asthma exacerbation and persistent thick-discolored mucus will start antibiotic ?Z-Pak ?Can take over-the-counter cold medications ?Continue Breo daily ?Prednisone 20 mg daily x5 days ? ?Call if no improvement ? ? ? ? ?

## 2021-12-15 IMAGING — CR DG CHEST 2V
2 series · 2 of 2 positions shown · non-contrast
Comparison: 04/21/2020

CLINICAL DATA: Chest pain for 1 week

EXAM:
CHEST - 2 VIEW

[w chest pa]
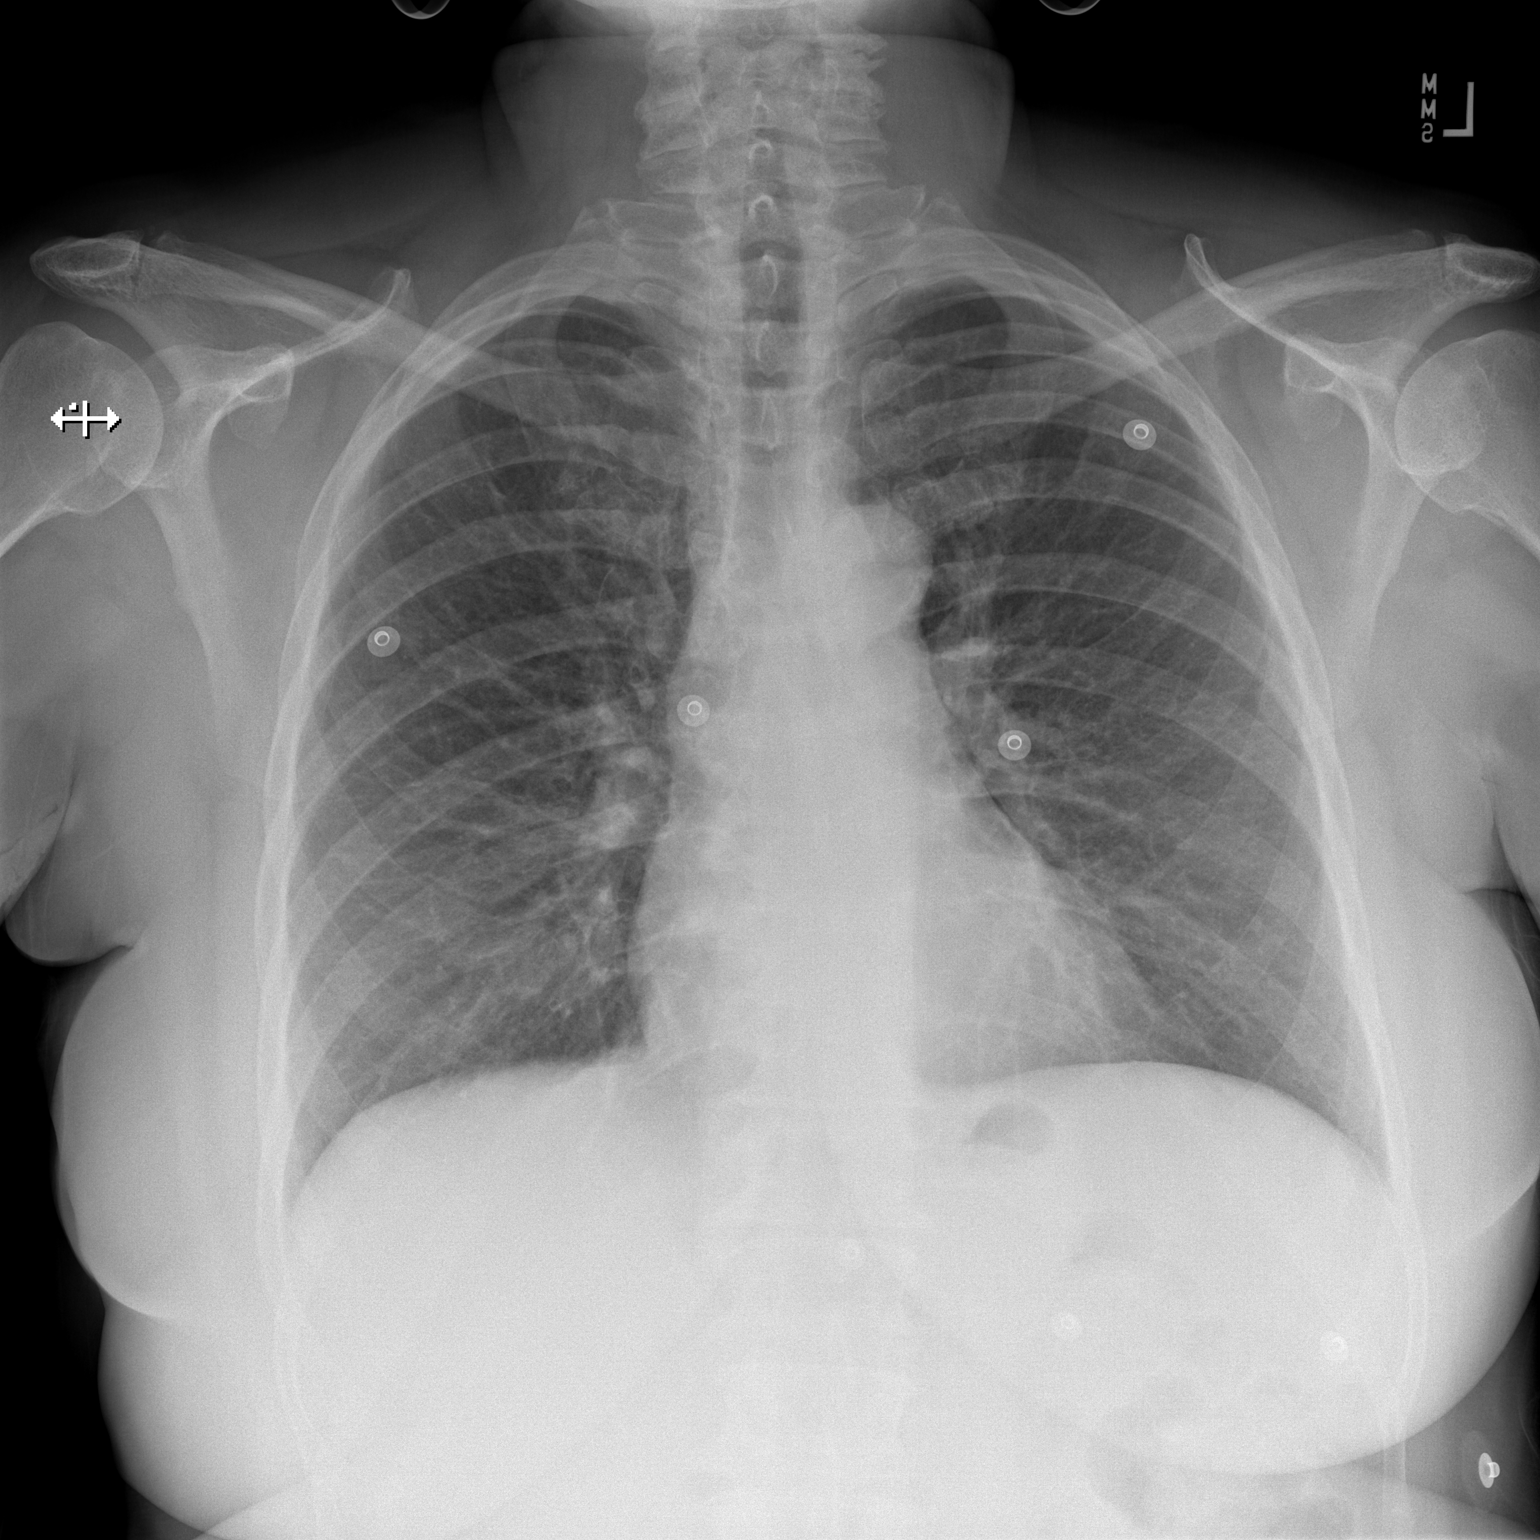

[w chest lat]
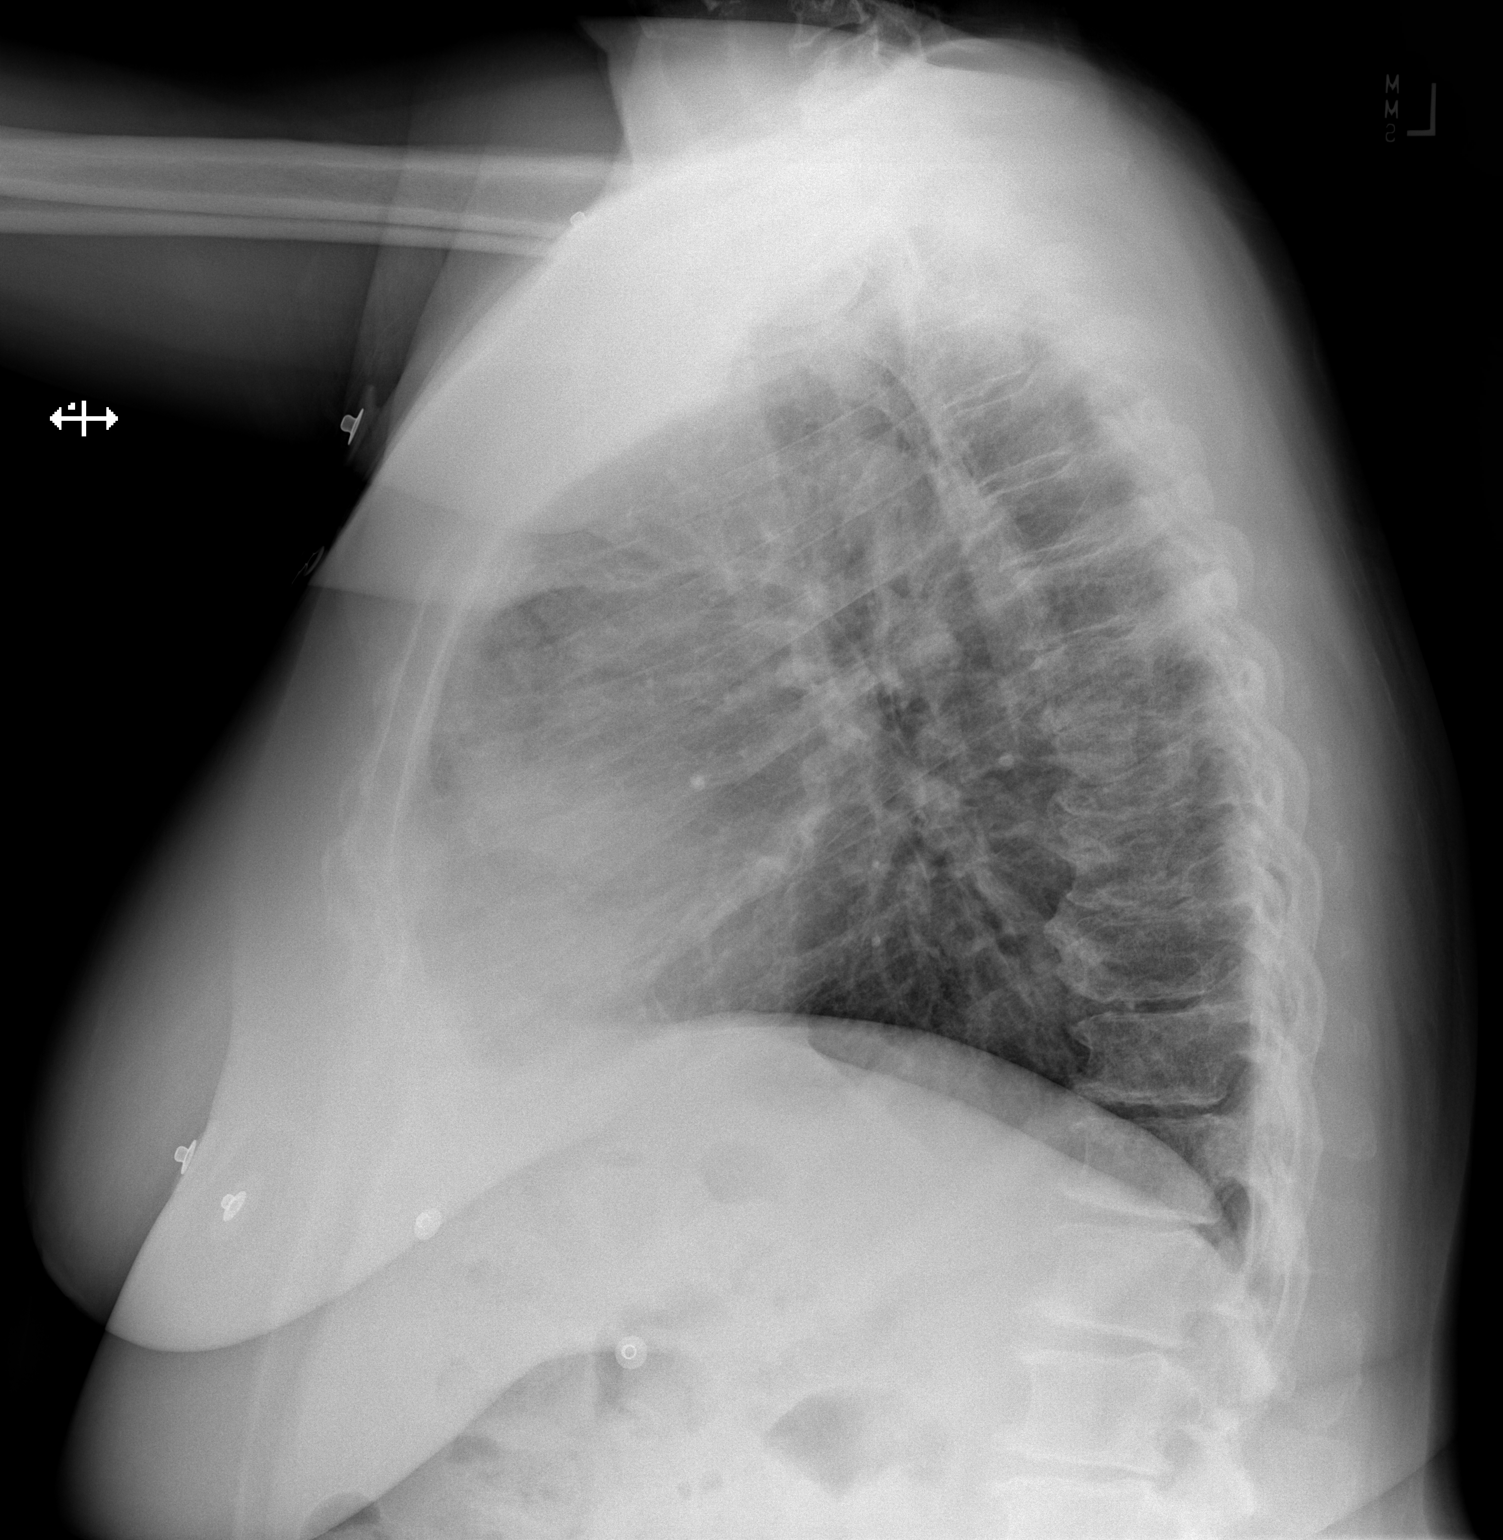

[2 of 2 positions shown; findings below may reference images not displayed]

FINDINGS: The heart size and mediastinal contours are within normal limits.
Both lungs are clear. The visualized skeletal structures show
degenerative changes of the thoracic spine.
IMPRESSION: No active cardiopulmonary disease.

## 2021-12-19 ENCOUNTER — Encounter: Payer: Medicare HMO | Admitting: Family Medicine

## 2021-12-19 NOTE — Progress Notes (Deleted)
PATIENT CHECK-IN and HEALTH RISK ASSESSMENT QUESTIONNAIRE:  -completed by phone/video for upcoming Medicare Preventive Visit  Pre-Visit Check-in: 1)Vitals (height, wt, BP, etc) - record in vitals section for visit on day of visit 2)Review and Update Medications, Allergies PMH, Surgeries, Social history in Epic 3)Hospitalizations in the last year with date/reason? ***  4)Review and Update Care Team (patient's specialists) in Epic 5) Complete PHQ9 in Epic  6) Complete Fall Screening in [place] Maintenance Due and order under PCP if not done.  8)Medicare Wellness Questionnaire: Answer theses question about your habits: Do you drink alcohol? *** How many drinks do you have a day?*** Have you ever smoked?*** Have you stopped smoking and date if applicable? ***  How many packs a day do you smoke? *** Do you use smokeless tobacco?*** Do you use an illicit drugs?*** Do you exercises? ***IF so, what type and how many days/minutes per week?*** Are you sexually active? ***Number of partners?*** What did you eat for breakfast today (or yesterday)?*** Typical breakfast**** What did you eat for lunch today (or yesterday)? *** Typical lunch*** What did you eat for diner today (or yesterday)?*** Typical dinner*** Typical snacks:**** What beverages do you drink besides water:***  Answer theses question about you: Can you perform most household chores?*** Do you find it hard to follow a conversation in a noisy room?*** Do you find it hard to understand a speaker at church or in a meeting?*** Do you often ask people to speak up or repeat themselves?*** Do you experience ringing in your ears?*** Do you have difficulty understanding a soft or whispered voice?*** Do you feel that you have a problem with memory?*** Do you often misplace items?*** Do you balance your checkbook and or bank acounts?*** Do you feel safe at home?*** Last dentist visit?*** Do you need assistance with  any of the following:  Driving?***  Feeding yourself?***  Getting from bed to chair?***  Getting to the toilet?***  Bathing or showering?***  Dressing yourself?***  Managing money?***  Climbing a flight of stairs?***  Preparing meals?***  Do you have Advanced Directives in place (Living Will, Healthcare Power or Attorney)? ***   Last eye Exam and location?***   Do you currently use prescribed or non-prescribed narcotic or opioid pain medications?***  Do you have a history or close family history of breast, ovarian, tubal or peritoneal cancer or a family member with BRCA (breast cancer susceptibility 1 and 2) gene mutations?  Nurse/Assistant Credentials/time stamp:   ----------------------------------------------------------------------------------------------------------------------------------------------------------------------------------------------------------------------   MEDICARE ANNUAL PREVENTIVE VISIT WITH PROVIDER: (Welcome to [employer], initial annual wellness or annual wellness exam)  Virtual Visit via Video Note  I connected with@  on $R'@DATE'Bv$ @ by a video enabled telemedicine application and verified that I am speaking with the correct person using two identifiers.  Location patient: home Location provider:work or home office Persons participating in the virtual visit: patient, provider  Concerns and/or follow up today:   See HM section in Epic for other details of completed HM.    ROS: negative for report of fevers, unintentional weight loss, vision changes, vision loss, hearing loss or change, chest pain, sob, hemoptysis, melena, hematochezia, hematuria, genital discharge or lesions, falls, bleeding or bruising, loc, thoughts of suicide or self harm, memory loss  Patient-completed extensive health risk assessment - reviewed and discussed with the patient: See Health Risk Assessment completed with patient prior to the visit either above or in recent phone  note. This was reviewed in detailed with the patient today and  appropriate recommendations, orders and referrals were placed as needed per Summary below and patient instructions.   Review of Medical History: -PMH, PSH, Family History and current specialty and care providers reviewed and updated and listed below   Patient Care Team: Horald Pollen, MD as PCP - General (Internal Medicine)   Past Medical History:  Diagnosis Date   Colon polyp, hyperplastic 03/29/2010   Diabetes mellitus    History of kidney stones    Mild intermittent asthma     Past Surgical History:  Procedure Laterality Date   CYST EXCISION Left 15 years ago   Axillary    ECTOPIC PREGNANCY SURGERY     OVARIAN CYST REMOVAL  35 years ago   TOTAL ABDOMINAL HYSTERECTOMY  1984    Social History   Socioeconomic History   Marital status: Married    Spouse name: Not on file   Number of children: 2   Years of education: Not on file   Highest education level: Not on file  Occupational History    Employer: [employer]  Tobacco Use   Smoking status: Former    Packs/day: 0.50    Years: 12.00    Total pack years: 6.00    Types: Cigarettes    Quit date: 05/12/1980    Years since quitting: 41.6   Smokeless tobacco: Never  Vaping Use   Vaping Use: Never used  Substance and Sexual Activity   Alcohol use: Yes    Alcohol/week: 0.0 standard drinks of alcohol    Comment: "Birthdays"   Drug use: No   Sexual activity: Yes    Partners: Male    Birth control/protection: Surgical    Comment: Hysterectomy  Other Topics Concern   Not on file  Social History Narrative   Not on file   Social Determinants of Health   Financial Resource Strain: Not on file  Food Insecurity: Not on file  Transportation Needs: Not on file  Physical Activity: Not on file  Stress: Not on file  Social Connections: Not on file  Intimate Partner Violence: Not on file    Family History  Problem Relation Age of Onset    Stomach cancer Maternal Grandmother    Heart disease Maternal Grandfather    Heart attack Maternal Grandfather    Diabetes Father    Hypertension Mother    Diabetes Brother    Hypertension Sister    Allergies Sister    Allergies Daughter     Current Outpatient Medications on File Prior to Visit  Medication Sig Dispense Refill   albuterol (VENTOLIN HFA) 108 (90 Base) MCG/ACT inhaler Inhale 1-2 puffs into the lungs every 4 (four) hours as needed for wheezing or shortness of breath.      ASPIRIN 81 PO Take by mouth.     atorvastatin (LIPITOR) 20 MG tablet Take 20 mg by mouth daily.     azithromycin (ZITHROMAX) 250 MG tablet Take two tabs the first day and then one tab daily for four days 6 tablet 0   BREO ELLIPTA 200-25 MCG/INH AEPB Inhale 1 puff into the lungs daily.     cholecalciferol (VITAMIN D3) 25 MCG (1000 UNIT) tablet Take 1,000 Units by mouth daily.     docusate sodium (COLACE) 100 MG capsule Take 100 mg by mouth daily as needed for mild constipation.      fluticasone furoate-vilanterol (BREO ELLIPTA) 200-25 MCG/ACT AEPB Inhale 1 puff into the lungs daily. 1 each 11   metFORMIN (GLUCOPHAGE) 1000 MG tablet Take  1,000 mg by mouth every morning.     methocarbamol (ROBAXIN) 500 MG tablet Take 1 tablet (500 mg total) by mouth 2 (two) times daily as needed for muscle spasms. 10 tablet 0   Na Sulfate-K Sulfate-Mg Sulf 17.5-3.13-1.6 GM/177ML SOLN TAKE 177 MLS BY MOUTH 2 (TWO) TIMES FOR 2 DOSES. Oral for 1 Days     predniSONE (DELTASONE) 20 MG tablet Take 1 tablet (20 mg total) by mouth daily with breakfast. 5 tablet 0   No current facility-administered medications on file prior to visit.    No Known Allergies     Physical Exam There were no vitals filed for this visit. Estimated body mass index is 37.11 kg/m as calculated from the following:   Height as of 09/16/21: $RemoveBe'5\' 5"'KFADCBBnc$  (1.651 m).   Weight as of 09/16/21: 223 lb (101.2 kg).  EKG (optional): deferred due to virtual  visit  GENERAL: alert, oriented, appears well and in no acute distress; visual acuity grossly intact, full vision exam deferred due to pandemic and/or virtual encounter  HEENT: atraumatic, conjunttiva clear, no obvious abnormalities on inspection of external nose and ears  NECK: normal movements of the head and neck  LUNGS: on inspection no signs of respiratory distress, breathing rate appears normal, no obvious gross SOB, gasping or wheezing  CV: no obvious cyanosis  MS: moves all visible extremities without noticeable abnormality  PSYCH/NEURO: pleasant and cooperative, no obvious depression or anxiety, speech and thought processing grossly intact, Cognitive function grossly intact  [place] Office Visit from 09/09/2021 in [place] at [facility]  PHQ-9 Total Score 0           09/09/2021   10:30 AM 06/10/2021   11:00 AM 12/24/2020    8:23 AM 07/12/2020    3:30 PM  Depression screen PHQ 2/9  Decreased Interest 0 0 0 0  Down, Depressed, Hopeless 0 0 0 0  PHQ - 2 Score 0 0 0 0  Altered sleeping 0  0   Tired, decreased energy 0  1   Change in appetite 0  1   Feeling bad or failure about yourself  0  0   Trouble concentrating 0  0   Moving slowly or fidgety/restless 0  0   Suicidal thoughts 0  0   PHQ-9 Score 0  2      SUMMARY AND PLAN:  No diagnosis found.  Visit coding *** [phone] (annual wellness visit -initial); G0439 (annual wellness subsequent); G0402 Welcome to Medicare(initial preventive physical exam)   The following health maintenance/preventive care measures were recommended/discussed and the patient was referred if needed and if the patient agreeable:   Vaccines   Mammogram   Screening Pap smear/pelvic exam   Lung Cancer Screening   Colorectal cancer screening   Osteoporosis screening if applicable  Screening for glaucoma  Statin use for primary prevention  Cardiovascular screening blood tests   Diabetes screening tests  Hepatitis  B screening if applicable  HIV screening   Hepatitis C screening   Chlamydia/Gonorrhea screening  STI Screening   Syphilis Screening if applicable  Latent TB screening if applicable  Breast Cancer prevention medication if applicable  BRCA related risk assessment and referral for counseling if applicable  Education and counseling on the following was provided based on the above review of health and a plan/checklist for the patient, along with additional information discussed, was provided for the patient in the patient instructions :  -Advised on importance of and resources for completing  advanced directives -Provided counseling and plan for difficulty hearing discussed/referral to audiology or ENT if applicable per above screening. -Provided counseling and plan for increased risk of falling if applicable per above screening. (Referral for PT, community based exercise programs, etc.) -Provided counseling and plan for function difficulties/ difficulties with ADLs if applicable per above screening. -Advised and counseled on maintaining healthy weight and healthy lifestyle - including the importance of a health diet, regular physical activity, social connections and stress management. -Advised and counseled on a whole foods based healthy diet and regular exercise: discussed a heart healthy whole foods based diet at length. A summary of a healthy diet was provided in the Patient Instructions.Offered referral to dietician/weight management clinic if applicable and follow up virtual visits to assist further and monitor progress. Recommended regular exercise and discussed options within the community.  -Advised yearly dental visits at minimum and regular eye exams -Advised and counseled on alcohol, tobacco, drug, opoid use/misuse if applicable and options for help if applicable.  Follow up: see patient instructions     There are no Patient Instructions on file for this visit.  Lucretia Kern, DO

## 2022-01-01 ENCOUNTER — Telehealth: Payer: Self-pay

## 2022-01-01 NOTE — Telephone Encounter (Signed)
Last OV 09/09/21 notes: Follow-up in 6 months.  Pt notified of above & appt scheduled.

## 2022-02-20 ENCOUNTER — Emergency Department (HOSPITAL_COMMUNITY): Payer: Medicare HMO

## 2022-02-20 ENCOUNTER — Emergency Department (HOSPITAL_COMMUNITY)
Admission: EM | Admit: 2022-02-20 | Discharge: 2022-02-20 | Disposition: A | Discharge status: Home / Self Care | Payer: Medicare HMO | Attending: Emergency Medicine | Admitting: Emergency Medicine

## 2022-02-20 ENCOUNTER — Encounter (HOSPITAL_COMMUNITY): Payer: Self-pay | Admitting: Emergency Medicine

## 2022-02-20 ENCOUNTER — Other Ambulatory Visit: Payer: Self-pay

## 2022-02-20 DIAGNOSIS — R079 Chest pain, unspecified: Secondary | ICD-10-CM | POA: Insufficient documentation

## 2022-02-20 DIAGNOSIS — M542 Cervicalgia: Secondary | ICD-10-CM | POA: Diagnosis not present

## 2022-02-20 DIAGNOSIS — R059 Cough, unspecified: Secondary | ICD-10-CM | POA: Diagnosis not present

## 2022-02-20 DIAGNOSIS — R0602 Shortness of breath: Secondary | ICD-10-CM | POA: Insufficient documentation

## 2022-02-20 LAB — BASIC METABOLIC PANEL
Anion gap: 7 (ref 5–15)
BUN: 8 mg/dL (ref 8–23)
CO2: 26 mmol/L (ref 22–32)
Calcium: 9.6 mg/dL (ref 8.9–10.3)
Chloride: 103 mmol/L (ref 98–111)
Creatinine, Ser: 0.51 mg/dL (ref 0.44–1.00)
GFR, Estimated: >60 mL/min (ref >60)
Glucose, Bld: 189 mg/dL — ABNORMAL HIGH (ref 70–99)
Potassium: 3.8 mmol/L (ref 3.5–5.1)
Sodium: 136 mmol/L (ref 135–145)

## 2022-02-20 LAB — CBC WITH DIFFERENTIAL/PLATELET
Abs Immature Granulocytes: 0.01 10*3/uL (ref 0.00–0.07)
Basophils Absolute: 0 10*3/uL (ref 0.0–0.1)
Basophils Relative: 1 %
Eosinophils Absolute: 0.3 10*3/uL (ref 0.0–0.5)
Eosinophils Relative: 4 %
HCT: 40.1 % (ref 36.0–46.0)
Hemoglobin: 13.5 g/dL (ref 12.0–15.0)
Immature Granulocytes: 0 %
Lymphocytes Relative: 35 %
Lymphs Abs: 2 10*3/uL (ref 0.7–4.0)
MCH: 29.3 pg (ref 26.0–34.0)
MCHC: 33.7 g/dL (ref 30.0–36.0)
MCV: 87 fL (ref 80.0–100.0)
Monocytes Absolute: 0.4 10*3/uL (ref 0.1–1.0)
Monocytes Relative: 7 %
Neutro Abs: 3.1 10*3/uL (ref 1.7–7.7)
Neutrophils Relative %: 53 %
Platelets: 262 10*3/uL (ref 150–400)
RBC: 4.61 MIL/uL (ref 3.87–5.11)
RDW: 12.5 % (ref 11.5–15.5)
WBC: 5.9 10*3/uL (ref 4.0–10.5)
nRBC: 0 % (ref 0.0–0.2)

## 2022-02-20 LAB — TROPONIN I (HIGH SENSITIVITY)
Troponin I (High Sensitivity): <2 ng/L (ref <18)
Troponin I (High Sensitivity): <2 ng/L (ref <18)

## 2022-02-20 MED ORDER — ASPIRIN 81 MG PO CHEW
324.0000 mg | CHEWABLE_TABLET | Freq: Once | ORAL | Status: AC
  Administered 2022-02-20: 162 mg via ORAL
  Filled 2022-02-20: qty 4

## 2022-02-20 MED ORDER — MAGNESIUM CITRATE PO SOLN
1.0000 | Freq: Once | ORAL | Status: AC
  Administered 2022-02-20: 1 via ORAL
  Filled 2022-02-20: qty 296

## 2022-02-20 MED ORDER — ALUM & MAG HYDROXIDE-SIMETH 200-200-20 MG/5ML PO SUSP
30.0000 mL | Freq: Once | ORAL | Status: AC
  Administered 2022-02-20: 30 mL via ORAL
  Filled 2022-02-20: qty 30

## 2022-02-20 NOTE — ED Triage Notes (Signed)
Pt c/o central chest pain with left arm and neck pain off and on since Monday.

## 2022-02-20 NOTE — Discharge Instructions (Signed)
You were seen today for chest pain. Your workup was reassuring for no signs of acute coronary syndrome. I recommend follow up with primary care as needed for issues with indigestion. If your chest pain worsens or you develop other life threatening symptoms such as shortness of breath, return to the emergency department.

## 2022-02-20 NOTE — ED Provider Notes (Signed)
  [facility] Emergency Department Provider Note MRN:  [id number]  Arrival date & time: 02/20/22     Chief Complaint   Chest Pain   History of Present Illness   Amy [name] is a 71 y.o. year-old female presents to the ED with chief complaint of intermittent, sharp chest pains for the past few days.  She states that the pains have become increasingly more frequent, but only last a few seconds when they do come.  She states that she has some mild associated SOB, but denies exertional components.  She tried taking some acid reflux medicine this morning.  She states that she has some cough with phlegm.  She also states that she has been having some tightness in her neck for the past week or so.  History provided by patient.   Review of Systems  Pertinent positive and negative review of systems noted in HPI.    Physical Exam   Vitals:   02/20/22 0554  BP: (!) 128/103  Pulse: 76  Resp: 18  Temp: 97.7 F (36.5 C)  SpO2: 98%    CONSTITUTIONAL:  well-appearing, NAD NEURO:  Alert and oriented x 3, CN 3-12 grossly intact EYES:  eyes equal and reactive ENT/NECK:  Supple, no stridor  CARDIO:  normal rate, regular rhythm, appears well-perfused  PULM:  No respiratory distress, CTAB GI/GU:  non-distended,  MSK/SPINE:  No gross deformities, no edema, moves all extremities  SKIN:  no rash, atraumatic   *Additional and/or pertinent findings included in MDM below  Diagnostic and Interventional Summary    EKG Interpretation  Date/Time:  Thursday February 20 2022 05:57:18 EDT Ventricular Rate:  78 PR Interval:  200 QRS Duration: 97 QT Interval:  400 QTC Calculation: 456 R Axis:   56 Text Interpretation: Sinus rhythm Low voltage, precordial leads Confirmed by Thayer Jew [id number]) on 02/20/2022 6:09:32 AM       Labs Reviewed  CBC WITH DIFFERENTIAL/PLATELET  BASIC METABOLIC PANEL  TROPONIN I (HIGH SENSITIVITY)    DG Chest Portable 1 View     (Results Pending)    Medications  aspirin chewable tablet 324 mg (has no administration in time range)  alum & mag hydroxide-simeth (MAALOX/MYLANTA) 200-200-20 MG/5ML suspension 30 mL (has no administration in time range)     Procedures  /  Critical Care Procedures  ED Course and Medical Decision Making  I have reviewed the triage vital signs, the nursing notes, and pertinent available records from the EMR.  Social Determinants Affecting Complexity of Care: Patient has no clinically significant social determinants affecting this chief complaint..   ED Course:    Medical Decision Making Amount and/or Complexity of Data Reviewed Labs: ordered. Radiology: ordered and independent interpretation performed.    Details: No obvious effusion or opacity on CXR.  Risk OTC drugs.     Consultants:    Treatment and Plan: Patient signed out shift change to oncoming team, who will continue care.  Plan:  Follow-up on labs, will need delta trop because she has had pain this morning.    Final Clinical Impressions(s) / ED Diagnoses  No diagnosis found.  ED Discharge Orders     None         Discharge Instructions Discussed with and Provided to Patient:   Discharge Instructions   None      Montine Circle, PA-C 02/20/22 0636    Merryl Hacker, MD 02/20/22 [phone]

## 2022-02-20 NOTE — ED Provider Notes (Signed)
Patient care taken over at shift handoff from Amy [name], [place]  Patient is a 71 year old female presenting to the emergency department complaining of intermittent, sharp chest pains for the past few days.  She states she initially felt the pain 2 weeks ago and they have become more frequent over the past few days.  Patient states she has mild shortness of breath but endorses an asthma history.  She states that her shortness of breath is not exertional.  The chest pains are sharp but last only a few seconds at a time.  She does endorse a cough with phlegm.  Patient states she has a history of some "heartburn" and is wondering if she may just have gas. Physical Exam  BP 123/76   Pulse 71   Temp 98.2 F (36.8 C) (Oral)   Resp 18   Ht '5\' 5"'$  (1.651 m)   Wt 103 kg   LMP 05/12/1982   SpO2 96%   BMI 37.77 kg/m   Physical Exam Vitals and nursing note reviewed.  Constitutional:      General: She is not in acute distress. HENT:     Head: Normocephalic and atraumatic.  Eyes:     Pupils: Pupils are equal, round, and reactive to light.  Cardiovascular:     Rate and Rhythm: Normal rate and regular rhythm.  Pulmonary:     Effort: Pulmonary effort is normal. No respiratory distress.     Breath sounds: Normal breath sounds.  Chest:     Chest wall: No tenderness.  Musculoskeletal:        General: No signs of injury.     Cervical back: Normal range of motion.     Right lower leg: No edema.     Left lower leg: No edema.  Skin:    General: Skin is dry.  Neurological:     Mental Status: She is alert.  Psychiatric:        Speech: Speech normal.        Behavior: Behavior normal.     Procedures  Procedures  ED Course / MDM    Medical Decision Making Amount and/or Complexity of Data Reviewed Labs: ordered. Radiology: ordered.  Risk OTC drugs.   Initial and follow-up troponin were both negative, less than 2.  BMP and CBC grossly unremarkable.  Chest x-ray with no acute findings.   EKG with no ischemic changes.  Doubt cardiac etiology at this time.  Patient's pain has subsided.  I did order her mag citrate to help with some chronic constipation.  Patient states she is ready to discharge home.  This is perfectly reasonable.  I see no indication that would require admission at this time.  Patient given return precautions including worsening chest pain, shortness of breath.       Dorothyann Peng, PA-C 02/20/22 9983    Tretha Sciara, MD 02/20/22 1452

## 2022-02-21 ENCOUNTER — Telehealth: Payer: Self-pay | Admitting: *Deleted

## 2022-02-21 NOTE — Telephone Encounter (Signed)
Transition Care Management Unsuccessful Follow-up Telephone Call  Date of discharge and from where:  02/20/22 from [facility]  Attempts:  1st Attempt  Reason for unsuccessful TCM follow-up call:  Left voice message to return call to make ER follow-up appt

## 2022-02-24 NOTE — Telephone Encounter (Signed)
Transition Care Management Unsuccessful Follow-up Telephone Call  Date of discharge and from where:  02/20/22 from [facility]  Attempts:  2nd Attempt  Reason for unsuccessful TCM follow-up call:  Left voice message to call back and make ER follow-up.Marland KitchenJohny Chess

## 2022-03-12 ENCOUNTER — Ambulatory Visit (INDEPENDENT_AMBULATORY_CARE_PROVIDER_SITE_OTHER): Payer: Medicare HMO | Admitting: Emergency Medicine

## 2022-03-12 ENCOUNTER — Encounter: Payer: Self-pay | Admitting: Emergency Medicine

## 2022-03-12 VITALS — BP 132/76 | HR 90 | Temp 98.1°F | Ht 65.0 in | Wt 232.2 lb

## 2022-03-12 DIAGNOSIS — E1165 Type 2 diabetes mellitus with hyperglycemia: Secondary | ICD-10-CM | POA: Diagnosis not present

## 2022-03-12 LAB — POCT GLYCOSYLATED HEMOGLOBIN (HGB A1C): Hemoglobin A1C: 7.3 % — AB (ref 4.0–5.6)

## 2022-03-12 NOTE — Progress Notes (Signed)
Amy [name] 71 y.o.   Chief Complaint  Patient presents with   Follow-up    107mth f/u appt, no concerns     HISTORY OF PRESENT ILLNESS: This is a 71y.o. female here for 620-monthollow-up on diabetes Only taking metformin 1000 mg twice a day. Noncompliant with exercise and nutrition. BP Readings from Last 3 Encounters:  03/12/22 132/76  02/20/22 121/80  09/16/21 120/78   Wt Readings from Last 3 Encounters:  03/12/22 232 lb 4 oz (105.3 kg)  02/20/22 227 lb (103 kg)  09/16/21 223 lb (101.2 kg)     HPI   Prior to Admission medications   Medication Sig Start Date End Date Taking? Authorizing Provider  acetaminophen-codeine (TYLENOL #3) 300-30 MG tablet Take 1 tablet by mouth every 6 (six) hours as needed (pain).   Yes [provider]  aspirin EC 325 MG tablet Take 325 mg by mouth daily as needed for mild pain.   Yes [provider]  DOCUSATE SODIUM PO Take 2 capsules by mouth daily as needed (constipation).   Yes [provider]  fluticasone furoate-vilanterol (BREO ELLIPTA) 200-25 MCG/ACT AEPB Inhale 1 puff into the lungs daily. Patient taking differently: Inhale 1 puff into the lungs daily as needed (trouble breathing). 09/16/21  Yes Burns, StClaudina LickMD  ibuprofen (ADVIL) 600 MG tablet Take 600 mg by mouth every 6 (six) hours as needed (pain).   Yes [provider]  magnesium citrate SOLN Take 1 Bottle by mouth once as needed for severe constipation.   Yes [provider]  metFORMIN (GLUCOPHAGE) 1000 MG tablet Take 1,000 mg by mouth daily.   Yes [provider]  Multiple Vitamin (MULTIVITAMIN) tablet Take 1 tablet by mouth daily.   Yes [provider]  polyethylene glycol powder (GLYCOLAX/MIRALAX) 17 GM/SCOOP powder Take 17 g by mouth daily as needed (constipation).   Yes [provider]    No Known Allergies  Patient Active Problem List   Diagnosis Date Noted   Hemorrhoids 12/18/2020   Abnormal EKG  04/27/2019   Mild intermittent asthma 10/26/2017   Morbid (severe) obesity due to excess calories ([place]/21/2018   [place]   History of kidney stones    Colon polyp, hyperplastic 03/29/2010    Past Medical History:  Diagnosis Date   Colon polyp, hyperplastic 03/29/2010   Diabetes mellitus    History of kidney stones    Mild intermittent asthma     Past Surgical History:  Procedure Laterality Date   CYST EXCISION Left 15 years ago   Axillary    ECTOPIC PREGNANCY SURGERY     OVARIAN CYST REMOVAL  35 years ago   TOTAL ABDOMINAL HYSTERECTOMY  1984    Social History   Socioeconomic History   Marital status: Married    Spouse name: Not on file   Number of children: 2   Years of education: Not on file   Highest education level: Not on file  Occupational History    [employer]: [employer]   Smoking status: Former    Packs/day: 0.50    Years: 12.00    Total pack years: 6.00    Types: Cigarettes    Quit date: 05/12/1980    Years since quitting: 41.8   Smokeless tobacco: Never  Vaping Use   Vaping Use: Never used  Substance and Sexual Activity   Alcohol use: Yes    Alcohol/week: 0.0 standard drinks of alcohol  Comment: "Birthdays"   Drug use: No   Sexual activity: Yes    Partners: Male    Birth control/protection: Surgical    Comment: Hysterectomy  Other Topics Concern   Not on file  Social History Narrative   Not on file   Social Determinants of Health   Financial Resource Strain: Not on file  Food Insecurity: Not on file  Transportation Needs: Not on file  Physical Activity: Not on file  Stress: Not on file  Social Connections: Not on file  Intimate Partner Violence: Not on file    Family History  Problem Relation Age of Onset   Stomach cancer Maternal Grandmother    Heart disease Maternal Grandfather    Heart attack Maternal Grandfather    Diabetes Father    Hypertension Mother    Diabetes Brother     Hypertension Sister    Allergies Sister    Allergies Daughter      Review of Systems  Constitutional: Negative.  Negative for chills and fever.  HENT: Negative.  Negative for congestion and sore throat.   Respiratory: Negative.  Negative for cough and shortness of breath.   Cardiovascular: Negative.  Negative for chest pain and palpitations.  Gastrointestinal:  Negative for abdominal pain, diarrhea, nausea and vomiting.  Genitourinary: Negative.   Skin: Negative.  Negative for rash.  Neurological: Negative.  Negative for dizziness and headaches.   Today's Vitals   03/12/22 0824  BP: 132/76  Pulse: 90  Temp: 98.1 F (36.7 C)  TempSrc: Oral  SpO2: 97%  Weight: 232 lb 4 oz (105.3 kg)  Height: '5\' 5"'$  (1.651 m)   Body mass index is 38.65 kg/m.   Physical Exam Constitutional:      Appearance: Normal appearance. She is obese.  HENT:     Head: Normocephalic.  Eyes:     Extraocular Movements: Extraocular movements intact.  Cardiovascular:     Rate and Rhythm: Normal rate.  Pulmonary:     Effort: Pulmonary effort is normal.  Skin:    General: Skin is warm and dry.  Neurological:     Mental Status: She is alert and oriented to person, place, and time.  Psychiatric:        Mood and Affect: Mood normal.        Behavior: Behavior normal.   Results for orders placed or performed in visit on 03/12/22 (from the past 24 hour(s))  POCT HgB A1C     Status: Abnormal   Collection Time: 03/12/22  9:01 AM  Result Value Ref Range   Hemoglobin A1C 7.3 (A) 4.0 - 5.6 %   HbA1c POC (<> result, manual entry)     HbA1c, POC (prediabetic range)     HbA1c, POC (controlled diabetic range)        ASSESSMENT & PLAN: A total of 45 minutes was spent with the patient and counseling/coordination of care regarding preparing for this visit, review of most recent office visit notes, review of most recent blood work results including interpretation of today's hemoglobin A1c, cardiovascular risks  associated with uncontrolled diabetes, education on nutrition, review of all medications and recommendations for new ones which she does not want to start, prognosis, documentation and need for follow-up in 3 months.  Problem List Items Addressed This Visit       Endocrine   Diabetes ([place]) - Primary    Uncontrolled diabetes with hemoglobin A1c of 7.3, higher than before. Does not want to start any new medications. Wants to improve  her diet and nutrition and exercise more. We will continue metformin 1000 mg twice a day and follow-up in 3 months. Cardiovascular risks associated with uncontrolled diabetes discussed. Diet and nutrition discussed.      Relevant Orders   POCT HgB A1C (Completed)     Other   Morbid (severe) obesity due to excess calories ([place])    Advised to decrease amount of daily carbohydrate intake and daily calories and increase amount of plant based protein in her diet. Advised to exercise more. Does not want to take cholesterol medication.      Patient Instructions  Take metformin 1000 mg twice a day. Follow-up in 3 months. Eat better and exercise more.  Diabetes Mellitus and Nutrition, Adult When you have diabetes, or diabetes mellitus, it is very important to have healthy eating habits because your blood sugar (glucose) levels are greatly affected by what you eat and drink. Eating healthy foods in the right amounts, at about the same times every day, can help you: Manage your blood glucose. Lower your risk of heart disease. Improve your blood pressure. Reach or maintain a healthy weight. What can affect my meal plan? Every person with diabetes is different, and each person has different needs for a meal plan. Your health care provider may recommend that you work with a dietitian to make a meal plan that is best for you. Your meal plan may vary depending on factors such as: The calories you need. The medicines you take. Your weight. Your blood glucose, blood  pressure, and cholesterol levels. Your activity level. Other health conditions you have, such as heart or kidney disease. How do carbohydrates affect me? Carbohydrates, also called carbs, affect your blood glucose level more than any other type of food. Eating carbs raises the amount of glucose in your blood. It is important to know how many carbs you can safely have in each meal. This is different for every person. Your dietitian can help you calculate how many carbs you should have at each meal and for each snack. How does alcohol affect me? Alcohol can cause a decrease in blood glucose (hypoglycemia), especially if you use insulin or take certain diabetes medicines by mouth. Hypoglycemia can be a life-threatening condition. Symptoms of hypoglycemia, such as sleepiness, dizziness, and confusion, are similar to symptoms of having too much alcohol. Do not drink alcohol if: Your health care provider tells you not to drink. You are pregnant, may be pregnant, or are planning to become pregnant. If you drink alcohol: Limit how much you have to: 0-1 drink a day for women. 0-2 drinks a day for men. Know how much alcohol is in your drink. In the [place] one drink equals one 12 oz bottle of beer (355 mL), one 5 oz glass of wine (148 mL), or one 1 oz glass of hard liquor (44 mL). Keep yourself hydrated with water, diet soda, or unsweetened iced tea. Keep in mind that regular soda, juice, and other mixers may contain a lot of sugar and must be counted as carbs. What are tips for following this plan?  Reading food labels Start by checking the serving size on the Nutrition Facts label of packaged foods and drinks. The number of calories and the amount of carbs, fats, and other nutrients listed on the label are based on one serving of the item. Many items contain more than one serving per package. Check the total grams (g) of carbs in one serving. Check the number of grams of saturated  fats and trans fats in  one serving. Choose foods that have a low amount or none of these fats. Check the number of milligrams (mg) of salt (sodium) in one serving. Most people should limit total sodium intake to less than 2,300 mg per day. Always check the nutrition information of foods labeled as "low-fat" or "nonfat." These foods may be higher in added sugar or refined carbs and should be avoided. Talk to your dietitian to identify your daily goals for nutrients listed on the label. Shopping Avoid buying canned, pre-made, or processed foods. These foods tend to be high in fat, sodium, and added sugar. Shop around the outside edge of the grocery store. This is where you will most often find fresh fruits and vegetables, bulk grains, fresh meats, and fresh dairy products. Cooking Use low-heat cooking methods, such as baking, instead of high-heat cooking methods, such as deep frying. Cook using healthy oils, such as olive, canola, or sunflower oil. Avoid cooking with butter, cream, or high-fat meats. Meal planning Eat meals and snacks regularly, preferably at the same times every day. Avoid going long periods of time without eating. Eat foods that are high in fiber, such as fresh fruits, vegetables, beans, and whole grains. Eat 4-6 oz (112-168 g) of lean protein each day, such as lean meat, chicken, fish, eggs, or tofu. One ounce (oz) (28 g) of lean protein is equal to: 1 oz (28 g) of meat, chicken, or fish. 1 egg.  cup (62 g) of tofu. Eat some foods each day that contain healthy fats, such as avocado, nuts, seeds, and fish. What foods should I eat? Fruits Berries. Apples. Oranges. Peaches. Apricots. Plums. Grapes. Mangoes. Papayas. Pomegranates. Kiwi. Cherries. Vegetables Leafy greens, including lettuce, spinach, kale, chard, collard greens, mustard greens, and cabbage. Beets. Cauliflower. Broccoli. Carrots. Green beans. Tomatoes. Peppers. Onions. Cucumbers. Brussels sprouts. Grains Whole grains, such as  whole-wheat or whole-grain bread, crackers, tortillas, cereal, and pasta. Unsweetened oatmeal. Quinoa. Brown or wild rice. Meats and other proteins Seafood. Poultry without skin. Lean cuts of poultry and beef. Tofu. Nuts. Seeds. Dairy Low-fat or fat-free dairy products such as milk, yogurt, and cheese. The items listed above may not be a complete list of foods and beverages you can eat and drink. Contact a dietitian for more information. What foods should I avoid? Fruits Fruits canned with syrup. Vegetables Canned vegetables. Frozen vegetables with butter or cream sauce. Grains Refined white flour and flour products such as bread, pasta, snack foods, and cereals. Avoid all processed foods. Meats and other proteins Fatty cuts of meat. Poultry with skin. Breaded or fried meats. Processed meat. Avoid saturated fats. Dairy Full-fat yogurt, cheese, or milk. Beverages Sweetened drinks, such as soda or iced tea. The items listed above may not be a complete list of foods and beverages you should avoid. Contact a dietitian for more information. Questions to ask a health care provider Do I need to meet with a certified diabetes care and education specialist? Do I need to meet with a dietitian? What number can I call if I have questions? When are the best times to check my blood glucose? Where to find more information: American Diabetes Association: diabetes.org Academy of Nutrition and Dietetics: eatright.[employer] of Diabetes and Digestive and Kidney Diseases: AmenCredit.is Association of Diabetes Care & Education Specialists: diabeteseducator.org Summary It is important to have healthy eating habits because your blood sugar (glucose) levels are greatly affected by what you eat and drink. It is important to use  alcohol carefully. A healthy meal plan will help you manage your blood glucose and lower your risk of heart disease. Your health care provider may recommend that you work  with a dietitian to make a meal plan that is best for you. This information is not intended to replace advice given to you by your health care provider. Make sure you discuss any questions you have with your health care provider. Document Revised: 11/30/2019 Document Reviewed: 11/30/2019 Elsevier Patient Education  [place], MD [facility]

## 2022-03-12 NOTE — Patient Instructions (Addendum)
Take metformin 1000 mg twice a day. Follow-up in 3 months. Eat better and exercise more.  Diabetes Mellitus and Nutrition, Adult When you have diabetes, or diabetes mellitus, it is very important to have healthy eating habits because your blood sugar (glucose) levels are greatly affected by what you eat and drink. Eating healthy foods in the right amounts, at about the same times every day, can help you: Manage your blood glucose. Lower your risk of heart disease. Improve your blood pressure. Reach or maintain a healthy weight. What can affect my meal plan? Every person with diabetes is different, and each person has different needs for a meal plan. Your health care provider may recommend that you work with a dietitian to make a meal plan that is best for you. Your meal plan may vary depending on factors such as: The calories you need. The medicines you take. Your weight. Your blood glucose, blood pressure, and cholesterol levels. Your activity level. Other health conditions you have, such as heart or kidney disease. How do carbohydrates affect me? Carbohydrates, also called carbs, affect your blood glucose level more than any other type of food. Eating carbs raises the amount of glucose in your blood. It is important to know how many carbs you can safely have in each meal. This is different for every person. Your dietitian can help you calculate how many carbs you should have at each meal and for each snack. How does alcohol affect me? Alcohol can cause a decrease in blood glucose (hypoglycemia), especially if you use insulin or take certain diabetes medicines by mouth. Hypoglycemia can be a life-threatening condition. Symptoms of hypoglycemia, such as sleepiness, dizziness, and confusion, are similar to symptoms of having too much alcohol. Do not drink alcohol if: Your health care provider tells you not to drink. You are pregnant, may be pregnant, or are planning to become pregnant. If  you drink alcohol: Limit how much you have to: 0-1 drink a day for women. 0-2 drinks a day for men. Know how much alcohol is in your drink. In the [place] one drink equals one 12 oz bottle of beer (355 mL), one 5 oz glass of wine (148 mL), or one 1 oz glass of hard liquor (44 mL). Keep yourself hydrated with water, diet soda, or unsweetened iced tea. Keep in mind that regular soda, juice, and other mixers may contain a lot of sugar and must be counted as carbs. What are tips for following this plan?  Reading food labels Start by checking the serving size on the Nutrition Facts label of packaged foods and drinks. The number of calories and the amount of carbs, fats, and other nutrients listed on the label are based on one serving of the item. Many items contain more than one serving per package. Check the total grams (g) of carbs in one serving. Check the number of grams of saturated fats and trans fats in one serving. Choose foods that have a low amount or none of these fats. Check the number of milligrams (mg) of salt (sodium) in one serving. Most people should limit total sodium intake to less than 2,300 mg per day. Always check the nutrition information of foods labeled as "low-fat" or "nonfat." These foods may be higher in added sugar or refined carbs and should be avoided. Talk to your dietitian to identify your daily goals for nutrients listed on the label. Shopping Avoid buying canned, pre-made, or processed foods. These foods tend to be high in fat,  sodium, and added sugar. Shop around the outside edge of the grocery store. This is where you will most often find fresh fruits and vegetables, bulk grains, fresh meats, and fresh dairy products. Cooking Use low-heat cooking methods, such as baking, instead of high-heat cooking methods, such as deep frying. Cook using healthy oils, such as olive, canola, or sunflower oil. Avoid cooking with butter, cream, or high-fat meats. Meal planning Eat  meals and snacks regularly, preferably at the same times every day. Avoid going long periods of time without eating. Eat foods that are high in fiber, such as fresh fruits, vegetables, beans, and whole grains. Eat 4-6 oz (112-168 g) of lean protein each day, such as lean meat, chicken, fish, eggs, or tofu. One ounce (oz) (28 g) of lean protein is equal to: 1 oz (28 g) of meat, chicken, or fish. 1 egg.  cup (62 g) of tofu. Eat some foods each day that contain healthy fats, such as avocado, nuts, seeds, and fish. What foods should I eat? Fruits Berries. Apples. Oranges. Peaches. Apricots. Plums. Grapes. Mangoes. Papayas. Pomegranates. Kiwi. Cherries. Vegetables Leafy greens, including lettuce, spinach, kale, chard, collard greens, mustard greens, and cabbage. Beets. Cauliflower. Broccoli. Carrots. Green beans. Tomatoes. Peppers. Onions. Cucumbers. Brussels sprouts. Grains Whole grains, such as whole-wheat or whole-grain bread, crackers, tortillas, cereal, and pasta. Unsweetened oatmeal. Quinoa. Brown or wild rice. Meats and other proteins Seafood. Poultry without skin. Lean cuts of poultry and beef. Tofu. Nuts. Seeds. Dairy Low-fat or fat-free dairy products such as milk, yogurt, and cheese. The items listed above may not be a complete list of foods and beverages you can eat and drink. Contact a dietitian for more information. What foods should I avoid? Fruits Fruits canned with syrup. Vegetables Canned vegetables. Frozen vegetables with butter or cream sauce. Grains Refined white flour and flour products such as bread, pasta, snack foods, and cereals. Avoid all processed foods. Meats and other proteins Fatty cuts of meat. Poultry with skin. Breaded or fried meats. Processed meat. Avoid saturated fats. Dairy Full-fat yogurt, cheese, or milk. Beverages Sweetened drinks, such as soda or iced tea. The items listed above may not be a complete list of foods and beverages you should avoid.  Contact a dietitian for more information. Questions to ask a health care provider Do I need to meet with a certified diabetes care and education specialist? Do I need to meet with a dietitian? What number can I call if I have questions? When are the best times to check my blood glucose? Where to find more information: American Diabetes Association: diabetes.org Academy of Nutrition and Dietetics: eatright.[employer] of Diabetes and Digestive and Kidney Diseases: AmenCredit.is Association of Diabetes Care & Education Specialists: diabeteseducator.org Summary It is important to have healthy eating habits because your blood sugar (glucose) levels are greatly affected by what you eat and drink. It is important to use alcohol carefully. A healthy meal plan will help you manage your blood glucose and lower your risk of heart disease. Your health care provider may recommend that you work with a dietitian to make a meal plan that is best for you. This information is not intended to replace advice given to you by your health care provider. Make sure you discuss any questions you have with your health care provider. Document Revised: 11/30/2019 Document Reviewed: 11/30/2019 Elsevier Patient Education  Amy [name].

## 2022-03-12 NOTE — Assessment & Plan Note (Signed)
Advised to decrease amount of daily carbohydrate intake and daily calories and increase amount of plant based protein in her diet. Advised to exercise more. Does not want to take cholesterol medication.

## 2022-03-12 NOTE — Assessment & Plan Note (Signed)
Uncontrolled diabetes with hemoglobin A1c of 7.3, higher than before. Does not want to start any new medications. Wants to improve her diet and nutrition and exercise more. We will continue metformin 1000 mg twice a day and follow-up in 3 months. Cardiovascular risks associated with uncontrolled diabetes discussed. Diet and nutrition discussed.

## 2022-03-18 ENCOUNTER — Ambulatory Visit (INDEPENDENT_AMBULATORY_CARE_PROVIDER_SITE_OTHER): Payer: Medicare HMO

## 2022-03-18 DIAGNOSIS — Z23 Encounter for immunization: Secondary | ICD-10-CM

## 2022-03-18 NOTE — Progress Notes (Signed)
Pt received flu vaccine w/o any complications.

## 2022-03-31 NOTE — Patient Instructions (Signed)

## 2022-03-31 NOTE — Progress Notes (Unsigned)
Subjective:   Amy [name] is a 71 y.o. female who presents for an Initial Medicare Annual Wellness Visit. I connected with  Amy [name] on 03/31/22 by a audio enabled telemedicine application and verified that I am speaking with the correct person using two identifiers.  Patient Location: Home  Provider Location: Home Office  I discussed the limitations of evaluation and management by telemedicine. The patient expressed understanding and agreed to proceed.  Review of Systems    Deferred to PCP       Objective:    There were no vitals filed for this visit. There is no height or weight on file to calculate BMI.     02/20/2022    5:55 AM 10/04/2020    1:19 PM 07/12/2020    3:29 PM 10/19/2019    7:48 PM 07/12/2019    3:17 PM 05/17/2019    7:54 AM 04/08/2019    9:10 AM  Advanced Directives  Does Patient Have a Medical Advance Directive? No No No No No No No  Would patient like information on creating a medical advance directive?   No - Patient declined  Yes (ED - Information included in AVS)  No - Patient declined    Current Medications (verified) Outpatient Encounter Medications as of 04/01/2022  Medication Sig   acetaminophen-codeine (TYLENOL #3) 300-30 MG tablet Take 1 tablet by mouth every 6 (six) hours as needed (pain).   aspirin EC 325 MG tablet Take 325 mg by mouth daily as needed for mild pain.   DOCUSATE SODIUM PO Take 2 capsules by mouth daily as needed (constipation).   fluticasone furoate-vilanterol (BREO ELLIPTA) 200-25 MCG/ACT AEPB Inhale 1 puff into the lungs daily. (Patient taking differently: Inhale 1 puff into the lungs daily as needed (trouble breathing).)   ibuprofen (ADVIL) 600 MG tablet Take 600 mg by mouth every 6 (six) hours as needed (pain).   magnesium citrate SOLN Take 1 Bottle by mouth once as needed for severe constipation.   metFORMIN (GLUCOPHAGE) 1000 MG tablet Take 1,000 mg by mouth daily.   Multiple Vitamin (MULTIVITAMIN) tablet Take 1 tablet  by mouth daily.   polyethylene glycol powder (GLYCOLAX/MIRALAX) 17 GM/SCOOP powder Take 17 g by mouth daily as needed (constipation).   No facility-administered encounter medications on file as of 04/01/2022.    Allergies (verified) Patient has no known allergies.   History: Past Medical History:  Diagnosis Date   Colon polyp, hyperplastic 03/29/2010   Diabetes mellitus    History of kidney stones    Mild intermittent asthma    Past Surgical History:  Procedure Laterality Date   CYST EXCISION Left 15 years ago   Axillary    ECTOPIC PREGNANCY SURGERY     OVARIAN CYST REMOVAL  35 years ago   TOTAL ABDOMINAL HYSTERECTOMY  1984   Family History  Problem Relation Age of Onset   Stomach cancer Maternal Grandmother    Heart disease Maternal Grandfather    Heart attack Maternal Grandfather    Diabetes Father    Hypertension Mother    Diabetes Brother    Hypertension Sister    Allergies Sister    Allergies Daughter    Social History   Socioeconomic History   Marital status: Married    Spouse name: Not on file   Number of children: 2   Years of education: Not on file   Highest education level: Not on file  Occupational History    Employer: [employer]  Tobacco  Use   Smoking status: Former    Packs/day: 0.50    Years: 12.00    Total pack years: 6.00    Types: Cigarettes    Quit date: 05/12/1980    Years since quitting: 41.9   Smokeless tobacco: Never  Vaping Use   Vaping Use: Never used  Substance and Sexual Activity   Alcohol use: Yes    Alcohol/week: 0.0 standard drinks of alcohol    Comment: "Birthdays"   Drug use: No   Sexual activity: Yes    Partners: Male    Birth control/protection: Surgical    Comment: Hysterectomy  Other Topics Concern   Not on file  Social History Narrative   Not on file   Social Determinants of Health   Financial Resource Strain: Not on file  Food Insecurity: Not on file  Transportation Needs: Not on file   Physical Activity: Not on file  Stress: Not on file  Social Connections: Not on file    Tobacco Counseling Counseling given: Not Answered   Clinical Intake:                 Diabetic?Yes Nutrition Risk Assessment:  Has the patient had any N/V/D within the last 2 months?  {YES/NO:21197} Does the patient have any non-healing wounds?  {YES/NO:21197} Has the patient had any unintentional weight loss or weight gain?  {YES/NO:21197}  Diabetes:  Is the patient diabetic?  {YES/NO:21197} If diabetic, was a CBG obtained today?  {YES/NO:21197} Did the patient bring in their glucometer from home?  {YES/NO:21197} How often do you monitor your CBG's? ***.   Financial Strains and Diabetes Management:  Are you having any financial strains with the device, your supplies or your medication? {YES/NO:21197}.  Does the patient want to be seen by Chronic Care Management for management of their diabetes?  {YES/NO:21197} Would the patient like to be referred to a Nutritionist or for Diabetic Management?  {YES/NO:21197}  Diabetic Exams:  Diabetic Eye Exam: Completed 04/09/21 Diabetic Foot Exam: Overdue, Pt has been advised about the importance in completing this exam. Pt is scheduled for diabetic foot exam on deferred to PCP.          Activities of Daily Living     No data to display           Patient Care Team: Georgina Quint, MD as PCP - General (Internal Medicine)  Indicate any recent Medical Services you may have received from other than [facility] providers in the past year (date may be approximate).     Assessment:   This is a routine wellness examination for Amy [name].  Hearing/Vision screen No results found.  Dietary issues and exercise activities discussed:     Goals Addressed   None   Depression Screen    03/12/2022    8:26 AM 09/09/2021   10:30 AM 06/10/2021   11:00 AM 12/24/2020    8:23 AM 07/12/2020    3:30 PM  PHQ 2/9 Scores  PHQ - 2 Score 0 0 0 0  0  PHQ- 9 Score  0  2     Fall Risk    03/12/2022    8:25 AM 09/09/2021   10:30 AM 06/10/2021   12:11 PM 06/10/2021   10:59 AM 07/12/2020    3:30 PM  Fall Risk   Falls in the past year? 0 0 0  0  Number falls in past yr: 0  0 0 0  Injury with Fall? 0  0 0  Risk for fall due to : No Fall Risks      Follow up Falls evaluation completed        FALL RISK PREVENTION PERTAINING TO THE HOME:  Any stairs in or around the home? {YES/NO:21197} If so, are there any without handrails? {YES/NO:21197} Home free of loose throw rugs in walkways, pet beds, electrical cords, etc? {YES/NO:21197} Adequate lighting in your home to reduce risk of falls? {YES/NO:21197}  ASSISTIVE DEVICES UTILIZED TO PREVENT FALLS:  Life alert? {YES/NO:21197} Use of a cane, walker or w/c? {YES/NO:21197} Grab bars in the bathroom? {YES/NO:21197} Shower chair or bench in shower? {YES/NO:21197} Elevated toilet seat or a handicapped toilet? {YES/NO:21197}  Cognitive Function:        Immunizations Immunization History  Administered Date(s) Administered   DTaP 06/06/2014   Fluad Quad(high Dose 65+) 05/17/2018, 04/26/2020, 03/01/2021, 03/18/2022   Influenza Split 05/17/2012, 04/19/2014   Influenza, Seasonal, Injecte, Preservative Fre 05/30/2015   Influenza,inj,quad, With Preservative 04/19/2014, 03/12/2016, 04/09/2017   PFIZER(Purple Top)SARS-COV-2 Vaccination 06/18/2019, 07/11/2019, 03/21/2020   PNEUMOCOCCAL CONJUGATE-20 04/30/2021   Pfizer Covid-19 Vaccine Bivalent Booster 79yrs & up 03/04/2021   Pneumococcal Conjugate-13 05/27/2018   Pneumococcal Polysaccharide-23 06/06/2014   Tdap 06/06/2014    Flu Vaccine status: Up to date  Pneumococcal vaccine status: Up to date  Covid-19 vaccine status: Information provided on how to obtain vaccines.   Qualifies for Shingles Vaccine? Yes   Zostavax completed No   Shingrix Completed?: No.    Education has been provided regarding the importance of this vaccine.  Patient has been advised to call insurance company to determine out of pocket expense if they have not yet received this vaccine. Advised may also receive vaccine at local pharmacy or Health Dept. Verbalized acceptance and understanding.  Screening Tests Health Maintenance  Topic Date Due   FOOT EXAM  Never done   Hepatitis C Screening  Never done   COVID-19 Vaccine (5 - Pfizer series) 07/05/2021   Zoster Vaccines- Shingrix (1 of 2) 12/11/2022 (Originally 09/20/2002)   OPHTHALMOLOGY EXAM  04/09/2022   Diabetic kidney evaluation - Urine ACR  06/10/2022   HEMOGLOBIN A1C  09/10/2022   Diabetic kidney evaluation - GFR measurement  02/21/2023   Medicare Annual Wellness (AWV)  04/02/2023   MAMMOGRAM  09/07/2023   COLONOSCOPY (Pts 45-80yrs Insurance coverage will need to be confirmed)  04/11/2031   Pneumonia Vaccine 44+ Years old  Completed   INFLUENZA VACCINE  Completed   DEXA SCAN  Completed   HPV VACCINES  Aged Out    Health Maintenance  Health Maintenance Due  Topic Date Due   FOOT EXAM  Never done   Hepatitis C Screening  Never done   COVID-19 Vaccine (5 - Pfizer series) 07/05/2021    Colorectal cancer screening: Type of screening: Colonoscopy. Completed 04/10/21. Repeat every 10 years  Mammogram status: Completed 09/06/21. Repeat every year  Bone Density status: Completed 12/18/17. Results reflect: Bone density results: NORMAL. Repeat every 2 years.  Lung Cancer Screening: (Low Dose CT Chest recommended if Age 28-80 years, 30 pack-year currently smoking OR have quit w/in 15years.) does not qualify.   Additional Screening:  Hepatitis C Screening: does qualify; Completed education provided  Vision Screening: Recommended annual ophthalmology exams for early detection of glaucoma and other disorders of the eye. Is the patient up to date with their annual eye exam?  {YES/NO:21197} Who is the provider or what is the name of the office in which the patient attends annual eye exams?  ***  If pt is not established with a provider, would they like to be referred to a provider to establish care? {YES/NO:21197}.   Dental Screening: Recommended annual dental exams for proper oral hygiene  Community Resource Referral / Chronic Care Management: CRR required this visit?  {YES/NO:21197}  CCM required this visit?  {YES/NO:21197}     Plan:     I have personally reviewed and noted the following in the patient's chart:   Medical and social history Use of alcohol, tobacco or illicit drugs  Current medications and supplements including opioid prescriptions. Patient is not currently taking opioid prescriptions. Functional ability and status Nutritional status Physical activity Advanced directives List of other physicians Hospitalizations, surgeries, and ER visits in previous 12 months Vitals Screenings to include cognitive, depression, and falls Referrals and appointments  In addition, I have reviewed and discussed with patient certain preventive protocols, quality metrics, and best practice recommendations. A written personalized care plan for preventive services as well as general preventive health recommendations were provided to patient.     Wanda Plump, RN   03/31/2022   Nurse Notes:  Ms. Nasuti , Thank you for taking time to come for your Medicare Wellness Visit. I appreciate your ongoing commitment to your health goals. Please review the following plan we discussed and let me know if I can assist you in the future.   These are the goals we discussed:  Goals   None     This is a list of the screening recommended for you and due dates:  Health Maintenance  Topic Date Due   Complete foot exam   Never done   Hepatitis C Screening: USPSTF Recommendation to screen - Ages 51-79 yo.  Never done   COVID-19 Vaccine (5 - Pfizer series) 07/05/2021   Zoster (Shingles) Vaccine (1 of 2) 12/11/2022*   Eye exam for diabetics  04/09/2022   Yearly kidney health urinalysis  for diabetes  06/10/2022   Hemoglobin A1C  09/10/2022   Yearly kidney function blood test for diabetes  02/21/2023   Medicare Annual Wellness Visit  04/02/2023   Mammogram  09/07/2023   Colon Cancer Screening  04/11/2031   Pneumonia Vaccine  Completed   Flu Shot  Completed   DEXA scan (bone density measurement)  Completed   HPV Vaccine  Aged Out  *Topic was postponed. The date shown is not the original due date.

## 2022-04-01 ENCOUNTER — Ambulatory Visit: Payer: Medicare HMO

## 2022-04-01 DIAGNOSIS — Z0000 Encounter for general adult medical examination without abnormal findings: Secondary | ICD-10-CM

## 2022-04-10 ENCOUNTER — Ambulatory Visit: Payer: Medicare HMO

## 2022-04-10 NOTE — Progress Notes (Unsigned)
Erroneous encounter

## 2022-04-10 NOTE — Patient Instructions (Signed)

## 2022-04-16 ENCOUNTER — Ambulatory Visit (INDEPENDENT_AMBULATORY_CARE_PROVIDER_SITE_OTHER): Payer: Medicare HMO

## 2022-04-16 VITALS — Ht 65.0 in | Wt 232.0 lb

## 2022-04-16 DIAGNOSIS — Z0000 Encounter for general adult medical examination without abnormal findings: Secondary | ICD-10-CM | POA: Diagnosis not present

## 2022-04-16 NOTE — Progress Notes (Signed)
Virtual Visit via Telephone Note  I connected with  [name] on 04/16/22 at  2:30 PM EST by telephone and verified that I am speaking with the correct person using two identifiers.  Location: Patient: Home Provider: [facility] Persons participating in the virtual visit: patient/Nurse Health Advisor   I discussed the limitations, risks, security and privacy concerns of performing an evaluation and management service by telephone and the availability of in person appointments. The patient expressed understanding and agreed to proceed.  Interactive audio and video telecommunications were attempted between this nurse and patient, however failed, due to patient having technical difficulties OR patient did not have access to video capability.  We continued and completed visit with audio only.  Some vital signs may be absent or patient reported.   Sheral Flow, LPN  Subjective:   Amy [name] is a 71 y.o. female who presents for an Initial Medicare Annual Wellness Visit.  Review of Systems     Cardiac Risk Factors include: advanced age (>36mn, >>79women);diabetes mellitus;family history of premature cardiovascular disease;obesity (BMI >30kg/m2)     Objective:    Today's Vitals   04/16/22 1437  Weight: 232 lb (105.2 kg)  Height: '5\' 5"'$  (1.651 m)  PainSc: 0-No pain   Body mass index is 38.61 kg/m.     04/16/2022    2:39 PM 02/20/2022    5:55 AM 10/04/2020    1:19 PM 07/12/2020    3:29 PM 10/19/2019    7:48 PM 07/12/2019    3:17 PM 05/17/2019    7:54 AM  Advanced Directives  Does Patient Have a Medical Advance Directive? No No No No No No No  Would patient like information on creating a medical advance directive? No - Patient declined   No - Patient declined  Yes (ED - Information included in AVS)     Current Medications (verified) Outpatient Encounter Medications as of 04/16/2022  Medication Sig   acetaminophen-codeine (TYLENOL #3) 300-30 MG tablet Take 1  tablet by mouth every 6 (six) hours as needed (pain).   aspirin EC 325 MG tablet Take 325 mg by mouth daily as needed for mild pain.   DOCUSATE SODIUM PO Take 2 capsules by mouth daily as needed (constipation).   fluticasone furoate-vilanterol (BREO ELLIPTA) 200-25 MCG/ACT AEPB Inhale 1 puff into the lungs daily. (Patient taking differently: Inhale 1 puff into the lungs daily as needed (trouble breathing).)   ibuprofen (ADVIL) 600 MG tablet Take 600 mg by mouth every 6 (six) hours as needed (pain).   magnesium citrate SOLN Take 1 Bottle by mouth once as needed for severe constipation.   metFORMIN (GLUCOPHAGE) 1000 MG tablet Take 1,000 mg by mouth daily.   Multiple Vitamin (MULTIVITAMIN) tablet Take 1 tablet by mouth daily.   polyethylene glycol powder (GLYCOLAX/MIRALAX) 17 GM/SCOOP powder Take 17 g by mouth daily as needed (constipation).   No facility-administered encounter medications on file as of 04/16/2022.    Allergies (verified) Patient has no known allergies.   History: Past Medical History:  Diagnosis Date   Colon polyp, hyperplastic 03/29/2010   Diabetes mellitus    History of kidney stones    Mild intermittent asthma    Past Surgical History:  Procedure Laterality Date   CYST EXCISION Left 15 years ago   Axillary    ECTOPIC PREGNANCY SURGERY     OVARIAN CYST REMOVAL  35 years ago   TOTAL ABDOMINAL HYSTERECTOMY  1984   Family History  Problem Relation Age  of Onset   Stomach cancer Maternal Grandmother    Heart disease Maternal Grandfather    Heart attack Maternal Grandfather    Diabetes Father    Hypertension Mother    Diabetes Brother    Hypertension Sister    Allergies Sister    Allergies Daughter    Social History   Socioeconomic History   Marital status: Married    Spouse name: Not on file   Number of children: 2   Years of education: Not on file   Highest education level: Not on file  Occupational History    Employer: [employer]   Tobacco Use   Smoking status: Former    Packs/day: 0.50    Years: 12.00    Total pack years: 6.00    Types: Cigarettes    Quit date: 05/12/1980    Years since quitting: 41.9   Smokeless tobacco: Never  Vaping Use   Vaping Use: Never used  Substance and Sexual Activity   Alcohol use: Yes    Alcohol/week: 0.0 standard drinks of alcohol    Comment: "Birthdays"   Drug use: No   Sexual activity: Yes    Partners: Male    Birth control/protection: Surgical    Comment: Hysterectomy  Other Topics Concern   Not on file  Social History Narrative   Not on file   Social Determinants of Health   Financial Resource Strain: Low Risk  (04/16/2022)   Overall Financial Resource Strain (CARDIA)    Difficulty of Paying Living Expenses: Not hard at all  Food Insecurity: No Food Insecurity (04/16/2022)   Hunger Vital Sign    Worried About Running Out of Food in the Last Year: Never true    [place] in the Last Year: Never true  Transportation Needs: No Transportation Needs (04/16/2022)   PRAPARE - Hydrologist (Medical): No    Lack of Transportation (Non-Medical): No  Physical Activity: Sufficiently Active (04/16/2022)   Exercise Vital Sign    Days of Exercise per Week: 5 days    Minutes of Exercise per Session: 30 min  Stress: No Stress Concern Present (04/16/2022)   [place]    Feeling of Stress : Not at all  Social Connections: [place] (04/16/2022)   Social Connection and Isolation Panel [NHANES]    Frequency of Communication with Friends and Family: More than three times a week    Frequency of Social Gatherings with Friends and Family: More than three times a week    Attends Religious Services: More than 4 times per year    Active Member of [employer] or Organizations: Yes    Attends Music therapist: More than 4 times per year    Marital Status: Married    Tobacco  Counseling Counseling given: Not Answered   Clinical Intake:  Pre-visit preparation completed: Yes  Pain : No/denies pain Pain Score: 0-No pain     BMI - recorded: 38.61 Nutritional Status: BMI > 30  Obese Nutritional Risks: None Diabetes: Yes CBG done?: No Did pt. bring in CBG monitor from home?: No  How often do you need to have someone help you when you read instructions, pamphlets, or other written materials from your doctor or pharmacy?: 1 - Never What is the last grade level you completed in school?: HSG  Nutrition Risk Assessment:  Has the patient had any N/V/D within the last 2 months?  No  Does the patient have any non-healing wounds?  No  Has the patient had any unintentional weight loss or weight gain?  No   Diabetes:  Is the patient diabetic?  Yes  If diabetic, was a CBG obtained today?  No  Did the patient bring in their glucometer from home?  No  How often do you monitor your CBG's? None; meds only.   Financial Strains and Diabetes Management:  Are you having any financial strains with the device, your supplies or your medication? No .  Does the patient want to be seen by Chronic Care Management for management of their diabetes?  No  Would the patient like to be referred to a Nutritionist or for Diabetic Management?  No   Diabetic Exams:  Diabetic Eye Exam: Overdue for diabetic eye exam. Pt has been advised about the importance in completing this exam. Patient advised to call and schedule an eye exam. Diabetic Foot Exam: Overdue, Pt has been advised about the importance in completing this exam. Pt is scheduled for diabetic foot exam on 06/17/2022.   Interpreter Needed?: No  Information entered by :: Lisette Abu, LPN.   Activities of Daily Living    04/16/2022    2:40 PM  In your present state of health, do you have any difficulty performing the following activities:  Hearing? 1  Vision? 1  Difficulty concentrating or making decisions? 1   Walking or climbing stairs? 1  Dressing or bathing? 1  Doing errands, shopping? 1  Preparing Food and eating ? Y  Using the Toilet? Y  In the past six months, have you accidently leaked urine? Y  Do you have problems with loss of bowel control? Y  Managing your Medications? Y  Managing your Finances? Y  Housekeeping or managing your Housekeeping? Y    Patient Care Team: Horald Pollen, MD as PCP - General (Internal Medicine)  Indicate any recent Medical Services you may have received from other than [facility] providers in the past year (date may be approximate).     Assessment:   This is a routine wellness examination for Laiyah.  Hearing/Vision screen Hearing Screening - Comments:: Denies hearing difficulties   Vision Screening - Comments:: No glasses - patient not up to date with routine eye exams.  Scheduled for January 2024.   Dietary issues and exercise activities discussed: Current Exercise Habits: Home exercise routine, Type of exercise: walking;Other - see comments (water aerobics), Time (Minutes): 30, Frequency (Times/Week): 5, Weekly Exercise (Minutes/Week): 150, Intensity: Moderate, Exercise limited by: respiratory conditions(s)   Goals Addressed             This Visit's Progress    Client understands the importance of follow-up with providers by attending scheduled visits        Depression Screen    03/12/2022    8:26 AM 09/09/2021   10:30 AM 06/10/2021   11:00 AM 12/24/2020    8:23 AM 07/12/2020    3:30 PM  PHQ 2/9 Scores  PHQ - 2 Score 0 0 0 0 0  PHQ- 9 Score  0  2     Fall Risk    04/16/2022    2:33 PM 03/12/2022    8:25 AM 09/09/2021   10:30 AM 06/10/2021   12:11 PM 06/10/2021   10:59 AM  Fall Risk   Falls in the past year? 0 0 0 0   Number falls in past yr: 0 0  0 0  Injury with Fall? 0 0  0 0  Risk for fall due to : No Fall Risks No Fall Risks     Follow up Falls prevention discussed Falls evaluation completed       FALL RISK PREVENTION  PERTAINING TO THE HOME:  Any stairs in or around the home? No  If so, are there any without handrails? No  Home free of loose throw rugs in walkways, pet beds, electrical cords, etc? Yes  Adequate lighting in your home to reduce risk of falls? Yes   ASSISTIVE DEVICES UTILIZED TO PREVENT FALLS:  Life alert? No  Use of a cane, walker or w/c? No  Grab bars in the bathroom? No  Shower chair or bench in shower? No  Elevated toilet seat or a handicapped toilet? No   TIMED UP AND GO:  Was the test performed? No . Phone Visit  Cognitive Function:        04/16/2022    2:41 PM  6CIT Screen  What Year? 0 points  What month? 0 points  What time? 0 points  Count back from 20 0 points  Months in reverse 0 points  Repeat phrase 0 points  Total Score 0 points    Immunizations Immunization History  Administered Date(s) Administered   DTaP 06/06/2014   Fluad Quad(high Dose 65+) 05/17/2018, 04/26/2020, 03/01/2021, 03/18/2022   Influenza Split 05/17/2012, 04/19/2014   Influenza, Seasonal, Injecte, Preservative Fre 05/30/2015   Influenza,inj,quad, With Preservative 04/19/2014, 03/12/2016, 04/09/2017   PFIZER(Purple Top)SARS-COV-2 Vaccination 06/18/2019, 07/11/2019, 03/21/2020   PNEUMOCOCCAL CONJUGATE-20 04/30/2021   Pfizer Covid-19 Vaccine Bivalent Booster 77yr & up 03/04/2021   Pneumococcal Conjugate-13 05/27/2018   Pneumococcal Polysaccharide-23 06/06/2014   Tdap 06/06/2014    TDAP status: Up to date  Flu Vaccine status: Up to date  Pneumococcal vaccine status: Up to date  Covid-19 vaccine status: Completed vaccines  Qualifies for Shingles Vaccine? Yes   Zostavax completed No   Shingrix Completed?: No.    Education has been provided regarding the importance of this vaccine. Patient has been advised to call insurance company to determine out of pocket expense if they have not yet received this vaccine. Advised may also receive vaccine at local pharmacy or Health Dept.  Verbalized acceptance and understanding.  Screening Tests Health Maintenance  Topic Date Due   FOOT EXAM  Never done   Hepatitis C Screening  Never done   COVID-19 Vaccine (5 - 2023-24 season) 01/10/2022   OPHTHALMOLOGY EXAM  04/09/2022   Zoster Vaccines- Shingrix (1 of 2) 12/11/2022 (Originally 09/20/2002)   Diabetic kidney evaluation - Urine ACR  06/10/2022   HEMOGLOBIN A1C  09/10/2022   Diabetic kidney evaluation - GFR measurement  02/21/2023   Medicare Annual Wellness (AWV)  04/17/2023   MAMMOGRAM  09/07/2023   DTaP/Tdap/Td (3 - Td or Tdap) 06/06/2024   COLONOSCOPY (Pts 45-441yrInsurance coverage will need to be confirmed)  04/11/2031   Pneumonia Vaccine 6568Years old  Completed   INFLUENZA VACCINE  Completed   DEXA SCAN  Completed   HPV VACCINES  Aged Out    Health Maintenance  Health Maintenance Due  Topic Date Due   FOOT EXAM  Never done   Hepatitis C Screening  Never done   COVID-19 Vaccine (5 - 2023-24 season) 01/10/2022   OPHTHALMOLOGY EXAM  04/09/2022    Colorectal cancer screening: Type of screening: Colonoscopy. Completed 04/10/2021. Repeat every 10 years  Mammogram status: Completed 08/17/2021. Repeat every year  Bone Density status: Completed 12/18/2017. Results reflect: Bone density results:  NORMAL. Repeat every 5 years.  Lung Cancer Screening: (Low Dose CT Chest recommended if Age 69-80 years, 30 pack-year currently smoking OR have quit w/in 15years.) does not qualify.   Lung Cancer Screening Referral: no  Additional Screening:  Hepatitis C Screening: does qualify; Completed no  Vision Screening: Recommended annual ophthalmology exams for early detection of glaucoma and other disorders of the eye. Is the patient up to date with their annual eye exam?  No  Who is the provider or what is the name of the office in which the patient attends annual eye exams? Patient scheduled for January 2024. If pt is not established with a provider, would they like to be  referred to a provider to establish care? No .   Dental Screening: Recommended annual dental exams for proper oral hygiene  Community Resource Referral / Chronic Care Management: CRR required this visit?  No   CCM required this visit?  No      Plan:     I have personally reviewed and noted the following in the patient's chart:   Medical and social history Use of alcohol, tobacco or illicit drugs  Current medications and supplements including opioid prescriptions. Patient is not currently taking opioid prescriptions. Functional ability and status Nutritional status Physical activity Advanced directives List of other physicians Hospitalizations, surgeries, and ER visits in previous 12 months Vitals Screenings to include cognitive, depression, and falls Referrals and appointments  In addition, I have reviewed and discussed with patient certain preventive protocols, quality metrics, and best practice recommendations. A written personalized care plan for preventive services as well as general preventive health recommendations were provided to patient.     Sheral Flow, LPN   16/05/958   Nurse Notes: N/A

## 2022-04-16 NOTE — Patient Instructions (Signed)
Amy [name] , Thank you for taking time to come for your Medicare Wellness Visit. I appreciate your ongoing commitment to your health goals. Please review the following plan we discussed and let me know if I can assist you in the future.   These are the goals we discussed:  Goals      Client understands the importance of follow-up with providers by attending scheduled visits        This is a list of the screening recommended for you and due dates:  Health Maintenance  Topic Date Due   Complete foot exam   Never done   Hepatitis C Screening: USPSTF Recommendation to screen - Ages 69-69 yo.  Never done   COVID-19 Vaccine (5 - 2023-24 season) 01/10/2022   Eye exam for diabetics  04/09/2022   Zoster (Shingles) Vaccine (1 of 2) 12/11/2022*   Yearly kidney health urinalysis for diabetes  06/10/2022   Hemoglobin A1C  09/10/2022   Yearly kidney function blood test for diabetes  02/21/2023   Medicare Annual Wellness Visit  04/17/2023   Mammogram  09/07/2023   DTaP/Tdap/Td vaccine (3 - Td or Tdap) 06/06/2024   Colon Cancer Screening  04/11/2031   Pneumonia Vaccine  Completed   Flu Shot  Completed   DEXA scan (bone density measurement)  Completed   HPV Vaccine  Aged Out  *Topic was postponed. The date shown is not the original due date.    Advanced directives: No  Conditions/risks identified: Yes; Type II Diabetes Mellitus  Next appointment: Follow up in one year for your annual wellness visit by calling [phone] to schedule with Nurse Mignon Pine.   Preventive Care 69 Years and Older, Female Preventive care refers to lifestyle choices and visits with your health care provider that can promote health and wellness. What does preventive care include? A yearly physical exam. This is also called an annual well check. Dental exams once or twice a year. Routine eye exams. Ask your health care provider how often you should have your eyes checked. Personal lifestyle choices,  including: Daily care of your teeth and gums. Regular physical activity. Eating a healthy diet. Avoiding tobacco and drug use. Limiting alcohol use. Practicing safe sex. Taking low-dose aspirin every day. Taking vitamin and mineral supplements as recommended by your health care provider. What happens during an annual well check? The services and screenings done by your health care provider during your annual well check will depend on your age, overall health, lifestyle risk factors, and family history of disease. Counseling  Your health care provider may ask you questions about your: Alcohol use. Tobacco use. Drug use. Emotional well-being. Home and relationship well-being. Sexual activity. Eating habits. History of falls. Memory and ability to understand (cognition). Work and work Statistician. Reproductive health. Screening  You may have the following tests or measurements: Height, weight, and BMI. Blood pressure. Lipid and cholesterol levels. These may be checked every 5 years, or more frequently if you are over 69 years old. Skin check. Lung cancer screening. You may have this screening every year starting at age 69 if you have a 30-pack-year history of smoking and currently smoke or have quit within the past 15 years. Fecal occult blood test (FOBT) of the stool. You may have this test every year starting at age 69. Flexible sigmoidoscopy or colonoscopy. You may have a sigmoidoscopy every 5 years or a colonoscopy every 10 years starting at age 69. Hepatitis C blood test. Hepatitis B blood test. Sexually transmitted disease (  STD) testing. Diabetes screening. This is done by checking your blood sugar (glucose) after you have not eaten for a while (fasting). You may have this done every 1-3 years. Bone density scan. This is done to screen for osteoporosis. You may have this done starting at age 69. Mammogram. This may be done every 1-2 years. Talk to your health care provider  about how often you should have regular mammograms. Talk with your health care provider about your test results, treatment options, and if necessary, the need for more tests. Vaccines  Your health care provider may recommend certain vaccines, such as: Influenza vaccine. This is recommended every year. Tetanus, diphtheria, and acellular pertussis (Tdap, Td) vaccine. You may need a Td booster every 10 years. Zoster vaccine. You may need this after age 69. Pneumococcal 13-valent conjugate (PCV13) vaccine. One dose is recommended after age 69. Pneumococcal polysaccharide (PPSV23) vaccine. One dose is recommended after age 69. Talk to your health care provider about which screenings and vaccines you need and how often you need them. This information is not intended to replace advice given to you by your health care provider. Make sure you discuss any questions you have with your health care provider. Document Released: 05/25/2015 Document Revised: 01/16/2016 Document Reviewed: 02/27/2015 Elsevier Interactive Patient Education  2017 [place] Prevention in the Home Falls can cause injuries. They can happen to people of all ages. There are many things you can do to make your home safe and to help prevent falls. What can I do on the outside of my home? Regularly fix the edges of walkways and driveways and fix any cracks. Remove anything that might make you trip as you walk through a door, such as a raised step or threshold. Trim any bushes or trees on the path to your home. Use bright outdoor lighting. Clear any walking paths of anything that might make someone trip, such as rocks or tools. Regularly check to see if handrails are loose or broken. Make sure that both sides of any steps have handrails. Any raised decks and porches should have guardrails on the edges. Have any leaves, snow, or ice cleared regularly. Use sand or salt on walking paths during winter. Clean up any spills in  your garage right away. This includes oil or grease spills. What can I do in the bathroom? Use night lights. Install grab bars by the toilet and in the tub and shower. Do not use towel bars as grab bars. Use non-skid mats or decals in the tub or shower. If you need to sit down in the shower, use a plastic, non-slip stool. Keep the floor dry. Clean up any water that spills on the floor as soon as it happens. Remove soap buildup in the tub or shower regularly. Attach bath mats securely with double-sided non-slip rug tape. Do not have throw rugs and other things on the floor that can make you trip. What can I do in the bedroom? Use night lights. Make sure that you have a light by your bed that is easy to reach. Do not use any sheets or blankets that are too big for your bed. They should not hang down onto the floor. Have a firm chair that has side arms. You can use this for support while you get dressed. Do not have throw rugs and other things on the floor that can make you trip. What can I do in the kitchen? Clean up any spills right away. Avoid walking on  wet floors. Keep items that you use a lot in easy-to-reach places. If you need to reach something above you, use a strong step stool that has a grab bar. Keep electrical cords out of the way. Do not use floor polish or wax that makes floors slippery. If you must use wax, use non-skid floor wax. Do not have throw rugs and other things on the floor that can make you trip. What can I do with my stairs? Do not leave any items on the stairs. Make sure that there are handrails on both sides of the stairs and use them. Fix handrails that are broken or loose. Make sure that handrails are as long as the stairways. Check any carpeting to make sure that it is firmly attached to the stairs. Fix any carpet that is loose or worn. Avoid having throw rugs at the top or bottom of the stairs. If you do have throw rugs, attach them to the floor with carpet  tape. Make sure that you have a light switch at the top of the stairs and the bottom of the stairs. If you do not have them, ask someone to add them for you. What else can I do to help prevent falls? Wear shoes that: Do not have high heels. Have rubber bottoms. Are comfortable and fit you well. Are closed at the toe. Do not wear sandals. If you use a stepladder: Make sure that it is fully opened. Do not climb a closed stepladder. Make sure that both sides of the stepladder are locked into place. Ask someone to hold it for you, if possible. Clearly mark and make sure that you can see: Any grab bars or handrails. First and last steps. Where the edge of each step is. Use tools that help you move around (mobility aids) if they are needed. These include: Canes. Walkers. Scooters. Crutches. Turn on the lights when you go into a dark area. Replace any light bulbs as soon as they burn out. Set up your furniture so you have a clear path. Avoid moving your furniture around. If any of your floors are uneven, fix them. If there are any pets around you, be aware of where they are. Review your medicines with your doctor. Some medicines can make you feel dizzy. This can increase your chance of falling. Ask your doctor what other things that you can do to help prevent falls. This information is not intended to replace advice given to you by your health care provider. Make sure you discuss any questions you have with your health care provider. Document Released: 02/22/2009 Document Revised: 10/04/2015 Document Reviewed: 06/02/2014 Elsevier Interactive Patient Education  2017 [employer].

## 2022-05-22 ENCOUNTER — Ambulatory Visit (HOSPITAL_BASED_OUTPATIENT_CLINIC_OR_DEPARTMENT_OTHER): Payer: Medicare HMO | Admitting: Obstetrics & Gynecology

## 2022-06-17 ENCOUNTER — Encounter: Payer: Self-pay | Admitting: Emergency Medicine

## 2022-06-17 ENCOUNTER — Ambulatory Visit (INDEPENDENT_AMBULATORY_CARE_PROVIDER_SITE_OTHER): Payer: Medicare HMO | Admitting: Emergency Medicine

## 2022-06-17 VITALS — BP 130/74 | HR 77 | Temp 98.8°F | Ht 65.0 in | Wt 229.1 lb

## 2022-06-17 DIAGNOSIS — E1165 Type 2 diabetes mellitus with hyperglycemia: Secondary | ICD-10-CM

## 2022-06-17 LAB — COMPREHENSIVE METABOLIC PANEL
ALT: 18 U/L (ref 0–35)
AST: 22 U/L (ref 0–37)
Albumin: 4.7 g/dL (ref 3.5–5.2)
Alkaline Phosphatase: 68 U/L (ref 39–117)
BUN: 7 mg/dL (ref 6–23)
CO2: 27 mEq/L (ref 19–32)
Calcium: 10.1 mg/dL (ref 8.4–10.5)
Chloride: 100 mEq/L (ref 96–112)
Creatinine, Ser: 0.53 mg/dL (ref 0.40–1.20)
GFR: 94.15 mL/min (ref >60.00)
Glucose, Bld: 131 mg/dL — ABNORMAL HIGH (ref 70–99)
Potassium: 3.9 mEq/L (ref 3.5–5.1)
Sodium: 138 mEq/L (ref 135–145)
Total Bilirubin: 0.3 mg/dL (ref 0.2–1.2)
Total Protein: 8 g/dL (ref 6.0–8.3)

## 2022-06-17 LAB — LIPID PANEL
Cholesterol: 176 mg/dL (ref 0–200)
HDL: 55.8 mg/dL (ref >39.00)
LDL Cholesterol: 90 mg/dL (ref 0–99)
NonHDL: 120.6
Total CHOL/HDL Ratio: 3
Triglycerides: 154 mg/dL — ABNORMAL HIGH (ref 0.0–149.0)
VLDL: 30.8 mg/dL (ref 0.0–40.0)

## 2022-06-17 LAB — POCT GLYCOSYLATED HEMOGLOBIN (HGB A1C): Hemoglobin A1C: 7.3 % — AB (ref 4.0–5.6)

## 2022-06-17 MED ORDER — EMPAGLIFLOZIN 10 MG PO TABS: 10.0000 mg | ORAL_TABLET | Freq: Every day | ORAL | 3 refills | Status: DC

## 2022-06-17 NOTE — Patient Instructions (Signed)

## 2022-06-17 NOTE — Progress Notes (Signed)
Amy [name] 72 y.o.   Chief Complaint  Patient presents with   Follow-up    12mth f/u appt Dm ,patient wants her cholesterol checked. She wants to try another cholesterol medication.   Patient wants to try the Continuous Glucose Meter     HISTORY OF PRESENT ILLNESS: This is a 72y.o. female here for 373-monthollow-up of diabetes. Presently taking metformin twice a day.  Not taking any other medication. Wants cholesterol levels checked. No other complaints or medical concerns today. Wt Readings from Last 3 Encounters:  06/17/22 229 lb 2 oz (103.9 kg)  04/16/22 232 lb (105.2 kg)  03/12/22 232 lb 4 oz (105.3 kg)     HPI   Prior to Admission medications   Medication Sig Start Date End Date Taking? Authorizing Provider  acetaminophen-codeine (TYLENOL #3) 300-30 MG tablet Take 1 tablet by mouth every 6 (six) hours as needed (pain).   Yes [provider]  aspirin EC 325 MG tablet Take 325 mg by mouth daily as needed for mild pain.   Yes [provider]  DOCUSATE SODIUM PO Take 2 capsules by mouth daily as needed (constipation).   Yes [provider]  fluticasone furoate-vilanterol (BREO ELLIPTA) 200-25 MCG/ACT AEPB Inhale 1 puff into the lungs daily. Patient taking differently: Inhale 1 puff into the lungs daily as needed (trouble breathing). 09/16/21  Yes Burns, StClaudina LickMD  ibuprofen (ADVIL) 600 MG tablet Take 600 mg by mouth every 6 (six) hours as needed (pain).   Yes [provider]  magnesium citrate SOLN Take 1 Bottle by mouth once as needed for severe constipation.   Yes [provider]  metFORMIN (GLUCOPHAGE) 1000 MG tablet Take 1,000 mg by mouth daily.   Yes [provider]  Multiple Vitamin (MULTIVITAMIN) tablet Take 1 tablet by mouth daily.   Yes [provider]  polyethylene glycol powder (GLYCOLAX/MIRALAX) 17 GM/SCOOP powder Take 17 g by mouth daily as needed (constipation).   Yes [provider]     No Known Allergies  Patient Active Problem List   Diagnosis Date Noted   Hemorrhoids 12/18/2020   Abnormal EKG 04/27/2019   Mild intermittent asthma 10/26/2017   Morbid (severe) obesity due to excess calories ([facility]05/21/2018   Diabetes ([place]04/05/2013   History of kidney stones    Colon polyp, hyperplastic 03/29/2010    Past Medical History:  Diagnosis Date   Colon polyp, hyperplastic 03/29/2010   Diabetes mellitus    History of kidney stones    Mild intermittent asthma     Past Surgical History:  Procedure Laterality Date   CYST EXCISION Left 15 years ago   Axillary    ECTOPIC PREGNANCY SURGERY     OVARIAN CYST REMOVAL  35 years ago   TOTAL ABDOMINAL HYSTERECTOMY  1984    Social History   Socioeconomic History   Marital status: Married    Spouse name: Not on file   Number of children: 2   Years of education: Not on file   Highest education level: Not on file  Occupational History    [employer] [employer]   Smoking status: Former    Packs/day: 0.50    Years: 12.00    Total pack years: 6.00    Types: Cigarettes    Quit date: 05/12/1980    Years since quitting: 42.1   Smokeless tobacco: Never  Vaping Use   Vaping Use: Never used  Substance and Sexual Activity  Alcohol use: Yes    Alcohol/week: 0.0 standard drinks of alcohol    Comment: "Birthdays"   Drug use: No   Sexual activity: Yes    Partners: Male    Birth control/protection: Surgical    Comment: Hysterectomy  Other Topics Concern   Not on file  Social History Narrative   Not on file   Social Determinants of Health   Financial Resource Strain: Low Risk  (04/16/2022)   Overall Financial Resource Strain (CARDIA)    Difficulty of Paying Living Expenses: Not hard at all  Food Insecurity: No Food Insecurity (04/16/2022)   Hunger Vital Sign    Worried About Running Out of Food in the Last Year: Never true    Liberty in the Last Year: Never true   Transportation Needs: No Transportation Needs (04/16/2022)   PRAPARE - Hydrologist (Medical): No    Lack of Transportation (Non-Medical): No  Physical Activity: Sufficiently Active (04/16/2022)   Exercise Vital Sign    Days of Exercise per Week: 5 days    Minutes of Exercise per Session: 30 min  Stress: No Stress Concern Present (04/16/2022)   [place]    Feeling of Stress : Not at all  Social Connections: [place] (04/16/2022)   Social Connection and Isolation Panel [NHANES]    Frequency of Communication with Friends and Family: More than three times a week    Frequency of Social Gatherings with Friends and Family: More than three times a week    Attends Religious Services: More than 4 times per year    Active Member of [employer] or Organizations: Yes    Attends Music therapist: More than 4 times per year    Marital Status: Married  Human resources officer Violence: Not At Risk (04/16/2022)   Humiliation, Afraid, Rape, and Kick questionnaire    Fear of Current or Ex-Partner: No    Emotionally Abused: No    Physically Abused: No    Sexually Abused: No    Family History  Problem Relation Age of Onset   Stomach cancer Maternal Grandmother    Heart disease Maternal Grandfather    Heart attack Maternal Grandfather    Diabetes Father    Hypertension Mother    Diabetes Brother    Hypertension Sister    Allergies Sister    Allergies Daughter      Review of Systems  Constitutional: Negative.  Negative for chills and fever.  HENT: Negative.  Negative for congestion and sore throat.   Respiratory: Negative.  Negative for cough and shortness of breath.   Cardiovascular: Negative.  Negative for chest pain and palpitations.  Gastrointestinal: Negative.  Negative for abdominal pain, diarrhea, nausea and vomiting.  Genitourinary: Negative.  Negative for dysuria and hematuria.   Musculoskeletal: Negative.   Skin: Negative.  Negative for rash.  Neurological: Negative.  Negative for dizziness and headaches.  All other systems reviewed and are negative.   Today's Vitals   06/17/22 1048  BP: 130/74  Pulse: 77  Temp: 98.8 F (37.1 C)  TempSrc: Oral  SpO2: 98%  Weight: 229 lb 2 oz (103.9 kg)  Height: '5\' 5"'$  (1.651 m)   Body mass index is 38.13 kg/m.  Physical Exam Vitals reviewed.  Constitutional:      Appearance: Normal appearance.  HENT:     Head: Normocephalic.  Eyes:     Extraocular Movements: Extraocular movements intact.  Cardiovascular:  Rate and Rhythm: Normal rate.  Pulmonary:     Effort: Pulmonary effort is normal.  Skin:    General: Skin is warm and dry.     Capillary Refill: Capillary refill takes less than 2 seconds.  Neurological:     Mental Status: She is alert and oriented to person, place, and time.  Psychiatric:        Mood and Affect: Mood normal.        Behavior: Behavior normal.     Results for orders placed or performed in visit on 06/17/22 (from the past 24 hour(s))  POCT HgB A1C     Status: Abnormal   Collection Time: 06/17/22 11:03 AM  Result Value Ref Range   Hemoglobin A1C 7.3 (A) 4.0 - 5.6 %   HbA1c POC (<> result, manual entry)     HbA1c, POC (prediabetic range)     HbA1c, POC (controlled diabetic range)      ASSESSMENT & PLAN: A total of 42 minutes was spent with the patient and counseling/coordination of care regarding preparing for this visit, review of most recent office visit notes, review of most recent blood work results including interpretation of today's hemoglobin A1c, cardiovascular risk associated with uncontrolled diabetes, education on nutrition, review of all medications and changes made including addition of Jardiance 10 mg daily, prognosis, documentation, and need for follow-up in 3 months.  Problem List Items Addressed This Visit       Endocrine   Diabetes ([place]) - Primary    Hemoglobin  A1c not at goal at 7.3. Presently taking metformin 1000 mg daily Inquiring about Jardiance. Recommend to start Jardiance 10 mg daily. Cardiovascular risks associated with uncontrolled diabetes discussed. Diet and nutrition discussed. Follow-up in 3 months. Blood work done today including lipid profile.      Relevant Medications   empagliflozin (JARDIANCE) 10 MG TABS tablet   Other Relevant Orders   POCT HgB A1C (Completed)   Comprehensive metabolic panel   Lipid panel   Patient Instructions  Diabetes Mellitus and Nutrition, Adult When you have diabetes, or diabetes mellitus, it is very important to have healthy eating habits because your blood sugar (glucose) levels are greatly affected by what you eat and drink. Eating healthy foods in the right amounts, at about the same times every day, can help you: Manage your blood glucose. Lower your risk of heart disease. Improve your blood pressure. Reach or maintain a healthy weight. What can affect my meal plan? Every person with diabetes is different, and each person has different needs for a meal plan. Your health care provider may recommend that you work with a dietitian to make a meal plan that is best for you. Your meal plan may vary depending on factors such as: The calories you need. The medicines you take. Your weight. Your blood glucose, blood pressure, and cholesterol levels. Your activity level. Other health conditions you have, such as heart or kidney disease. How do carbohydrates affect me? Carbohydrates, also called carbs, affect your blood glucose level more than any other type of food. Eating carbs raises the amount of glucose in your blood. It is important to know how many carbs you can safely have in each meal. This is different for every person. Your dietitian can help you calculate how many carbs you should have at each meal and for each snack. How does alcohol affect me? Alcohol can cause a decrease in blood glucose  (hypoglycemia), especially if you use insulin or take certain diabetes  medicines by mouth. Hypoglycemia can be a life-threatening condition. Symptoms of hypoglycemia, such as sleepiness, dizziness, and confusion, are similar to symptoms of having too much alcohol. Do not drink alcohol if: Your health care provider tells you not to drink. You are pregnant, may be pregnant, or are planning to become pregnant. If you drink alcohol: Limit how much you have to: 0-1 drink a day for women. 0-2 drinks a day for men. Know how much alcohol is in your drink. In the [place] one drink equals one 12 oz bottle of beer (355 mL), one 5 oz glass of wine (148 mL), or one 1 oz glass of hard liquor (44 mL). Keep yourself hydrated with water, diet soda, or unsweetened iced tea. Keep in mind that regular soda, juice, and other mixers may contain a lot of sugar and must be counted as carbs. What are tips for following this plan?  Reading food labels Start by checking the serving size on the Nutrition Facts label of packaged foods and drinks. The number of calories and the amount of carbs, fats, and other nutrients listed on the label are based on one serving of the item. Many items contain more than one serving per package. Check the total grams (g) of carbs in one serving. Check the number of grams of saturated fats and trans fats in one serving. Choose foods that have a low amount or none of these fats. Check the number of milligrams (mg) of salt (sodium) in one serving. Most people should limit total sodium intake to less than 2,300 mg per day. Always check the nutrition information of foods labeled as "low-fat" or "nonfat." These foods may be higher in added sugar or refined carbs and should be avoided. Talk to your dietitian to identify your daily goals for nutrients listed on the label. Shopping Avoid buying canned, pre-made, or processed foods. These foods tend to be high in fat, sodium, and added sugar. Shop  around the outside edge of the grocery store. This is where you will most often find fresh fruits and vegetables, bulk grains, fresh meats, and fresh dairy products. Cooking Use low-heat cooking methods, such as baking, instead of high-heat cooking methods, such as deep frying. Cook using healthy oils, such as olive, canola, or sunflower oil. Avoid cooking with butter, cream, or high-fat meats. Meal planning Eat meals and snacks regularly, preferably at the same times every day. Avoid going long periods of time without eating. Eat foods that are high in fiber, such as fresh fruits, vegetables, beans, and whole grains. Eat 4-6 oz (112-168 g) of lean protein each day, such as lean meat, chicken, fish, eggs, or tofu. One ounce (oz) (28 g) of lean protein is equal to: 1 oz (28 g) of meat, chicken, or fish. 1 egg.  cup (62 g) of tofu. Eat some foods each day that contain healthy fats, such as avocado, nuts, seeds, and fish. What foods should I eat? Fruits Berries. Apples. Oranges. Peaches. Apricots. Plums. Grapes. Mangoes. Papayas. Pomegranates. Kiwi. Cherries. Vegetables Leafy greens, including lettuce, spinach, kale, chard, collard greens, mustard greens, and cabbage. Beets. Cauliflower. Broccoli. Carrots. Green beans. Tomatoes. Peppers. Onions. Cucumbers. Brussels sprouts. Grains Whole grains, such as whole-wheat or whole-grain bread, crackers, tortillas, cereal, and pasta. Unsweetened oatmeal. Quinoa. Brown or wild rice. Meats and other proteins Seafood. Poultry without skin. Lean cuts of poultry and beef. Tofu. Nuts. Seeds. Dairy Low-fat or fat-free dairy products such as milk, yogurt, and cheese. The items listed above may not  be a complete list of foods and beverages you can eat and drink. Contact a dietitian for more information. What foods should I avoid? Fruits Fruits canned with syrup. Vegetables Canned vegetables. Frozen vegetables with butter or cream sauce. Grains Refined  white flour and flour products such as bread, pasta, snack foods, and cereals. Avoid all processed foods. Meats and other proteins Fatty cuts of meat. Poultry with skin. Breaded or fried meats. Processed meat. Avoid saturated fats. Dairy Full-fat yogurt, cheese, or milk. Beverages Sweetened drinks, such as soda or iced tea. The items listed above may not be a complete list of foods and beverages you should avoid. Contact a dietitian for more information. Questions to ask a health care provider Do I need to meet with a certified diabetes care and education specialist? Do I need to meet with a dietitian? What number can I call if I have questions? When are the best times to check my blood glucose? Where to find more information: American Diabetes Association: diabetes.org Academy of Nutrition and Dietetics: eatright.[employer] of Diabetes and Digestive and Kidney Diseases: [employer]is Association of Diabetes Care & Education Specialists: diabeteseducator.org Summary It is important to have healthy eating habits because your blood sugar (glucose) levels are greatly affected by what you eat and drink. It is important to use alcohol carefully. A healthy meal plan will help you manage your blood glucose and lower your risk of heart disease. Your health care provider may recommend that you work with a dietitian to make a meal plan that is best for you. This information is not intended to replace advice given to you by your health care provider. Make sure you discuss any questions you have with your health care provider. Document Revised: 11/30/2019 Document Reviewed: 11/30/2019 Elsevier Patient Education  [place], MD [facility]

## 2022-06-17 NOTE — Assessment & Plan Note (Signed)
Hemoglobin A1c not at goal at 7.3. Presently taking metformin 1000 mg daily Inquiring about Jardiance. Recommend to start Jardiance 10 mg daily. Cardiovascular risks associated with uncontrolled diabetes discussed. Diet and nutrition discussed. Follow-up in 3 months. Blood work done today including lipid profile.

## 2022-07-03 ENCOUNTER — Encounter (HOSPITAL_BASED_OUTPATIENT_CLINIC_OR_DEPARTMENT_OTHER): Payer: Self-pay | Admitting: Obstetrics & Gynecology

## 2022-07-03 ENCOUNTER — Ambulatory Visit (HOSPITAL_BASED_OUTPATIENT_CLINIC_OR_DEPARTMENT_OTHER): Payer: Medicare HMO | Admitting: Obstetrics & Gynecology

## 2022-07-03 VITALS — BP 138/80 | HR 66 | Ht 66.0 in | Wt 230.0 lb

## 2022-07-03 DIAGNOSIS — L918 Other hypertrophic disorders of the skin: Secondary | ICD-10-CM | POA: Diagnosis not present

## 2022-07-03 DIAGNOSIS — D1723 Benign lipomatous neoplasm of skin and subcutaneous tissue of right leg: Secondary | ICD-10-CM

## 2022-07-03 DIAGNOSIS — N907 Vulvar cyst: Secondary | ICD-10-CM

## 2022-07-05 ENCOUNTER — Emergency Department (HOSPITAL_BASED_OUTPATIENT_CLINIC_OR_DEPARTMENT_OTHER)
Admission: EM | Admit: 2022-07-05 | Discharge: 2022-07-05 | Disposition: A | Discharge status: Home / Self Care | Payer: Medicare HMO | Attending: Emergency Medicine | Admitting: Emergency Medicine

## 2022-07-05 ENCOUNTER — Encounter (HOSPITAL_BASED_OUTPATIENT_CLINIC_OR_DEPARTMENT_OTHER): Payer: Self-pay | Admitting: Emergency Medicine

## 2022-07-05 ENCOUNTER — Emergency Department (HOSPITAL_BASED_OUTPATIENT_CLINIC_OR_DEPARTMENT_OTHER): Payer: Medicare HMO | Admitting: Radiology

## 2022-07-05 ENCOUNTER — Other Ambulatory Visit: Payer: Self-pay

## 2022-07-05 DIAGNOSIS — M545 Low back pain, unspecified: Secondary | ICD-10-CM | POA: Diagnosis not present

## 2022-07-05 DIAGNOSIS — R110 Nausea: Secondary | ICD-10-CM | POA: Diagnosis not present

## 2022-07-05 DIAGNOSIS — Y92410 Unspecified street and highway as the place of occurrence of the external cause: Secondary | ICD-10-CM | POA: Diagnosis not present

## 2022-07-05 DIAGNOSIS — Z7984 Long term (current) use of oral hypoglycemic drugs: Secondary | ICD-10-CM | POA: Insufficient documentation

## 2022-07-05 DIAGNOSIS — Z7982 Long term (current) use of aspirin: Secondary | ICD-10-CM | POA: Diagnosis not present

## 2022-07-05 DIAGNOSIS — E119 Type 2 diabetes mellitus without complications: Secondary | ICD-10-CM | POA: Insufficient documentation

## 2022-07-05 MED ORDER — ACETAMINOPHEN 500 MG PO TABS: 500.0000 mg | ORAL_TABLET | Freq: Four times a day (QID) | ORAL | 0 refills | Status: AC | PRN

## 2022-07-05 MED ORDER — ONDANSETRON 4 MG PO TBDP
4.0000 mg | ORAL_TABLET | Freq: Once | ORAL | Status: DC
  Filled 2022-07-05: qty 1

## 2022-07-05 MED ORDER — IBUPROFEN 600 MG PO TABS: 600.0000 mg | ORAL_TABLET | Freq: Four times a day (QID) | ORAL | 0 refills | Status: AC | PRN

## 2022-07-05 MED ORDER — ONDANSETRON HCL 4 MG PO TABS: 4.0000 mg | ORAL_TABLET | Freq: Four times a day (QID) | ORAL | 0 refills | Status: DC

## 2022-07-05 MED ORDER — METHOCARBAMOL 500 MG PO TABS: 500.0000 mg | ORAL_TABLET | Freq: Every evening | ORAL | 0 refills | Status: DC
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
## 2022-07-05 NOTE — ED Notes (Signed)
Patient transported to X-ray 

## 2022-07-06 ENCOUNTER — Encounter (HOSPITAL_BASED_OUTPATIENT_CLINIC_OR_DEPARTMENT_OTHER): Payer: Self-pay | Admitting: Obstetrics & Gynecology

## 2022-07-06 NOTE — Progress Notes (Signed)
GYNECOLOGY  VISIT  CC:   skin tag removal  HPI: 72 y.o. G22P2012 Married Amy [name] here for removal of skin tag.  Is right between thighs and rubs a lot.  Is bothersome.  Last seen by Dr. Kennon Rounds in 2022. H/o TAH 1984.  No recent vaginal bleeding.  No h/o abnormal pap smears. Last MMG 09/06/2021 (reviewed).    She does have some labial lesions that feel hard that she would like me to look at today as well.  Past Medical History:  Diagnosis Date   Colon polyp, hyperplastic 03/29/2010   Diabetes mellitus    History of kidney stones    Mild intermittent asthma     MEDS:   Current Outpatient Medications on File Prior to Visit  Medication Sig Dispense Refill   acetaminophen-codeine (TYLENOL #3) 300-30 MG tablet Take 1 tablet by mouth every 6 (six) hours as needed (pain).     aspirin EC 325 MG tablet Take 325 mg by mouth daily as needed for mild pain.     DOCUSATE SODIUM PO Take 2 capsules by mouth daily as needed (constipation).     empagliflozin (JARDIANCE) 10 MG TABS tablet Take 1 tablet (10 mg total) by mouth daily before breakfast. 90 tablet 3   fluticasone furoate-vilanterol (BREO ELLIPTA) 200-25 MCG/ACT AEPB Inhale 1 puff into the lungs daily. (Patient taking differently: Inhale 1 puff into the lungs daily as needed (trouble breathing).) 1 each 11   magnesium citrate SOLN Take 1 Bottle by mouth once as needed for severe constipation.     metFORMIN (GLUCOPHAGE) 1000 MG tablet Take 1,000 mg by mouth daily.     Multiple Vitamin (MULTIVITAMIN) tablet Take 1 tablet by mouth daily.     polyethylene glycol powder (GLYCOLAX/MIRALAX) 17 GM/SCOOP powder Take 17 g by mouth daily as needed (constipation).     No current facility-administered medications on file prior to visit.    ALLERGIES: Patient has no known allergies.  SH:  married, non smoker  Review of Systems  Constitutional: Negative.   Genitourinary: Negative.     PHYSICAL EXAMINATION:    BP 138/80    Pulse 66   Ht '5\' 6"'$  (1.676 m) Comment: Reported  Wt 230 lb (104.3 kg)   LMP 05/12/1982   BMI 37.12 kg/m     General appearance: alert, cooperative and appears stated age Lymph:  no inguinal LAD noted Skin: right inner thigh skin tag on top of a small lipoma.    Pelvic: External genitalia:  multiple small sebaceous cysts on inner labia majora bilaterally              Urethra:  normal appearing urethra with no masses, tenderness or lesions              Bartholins and Skenes: normal                   Assessment/Plan: 1. Skin tag - as this is on top of a small lipoma, pt actually wants both removed.  I likely could do this but do not remove lipomas and will plan to refer her to plastic surgery for this  2. Lipoma of right thigh  3. Sebaceous cyst of labia - pt reassured about this finding and no need for removal/treatment.

## 2022-07-31 ENCOUNTER — Ambulatory Visit: Payer: Medicare HMO | Admitting: Plastic Surgery

## 2022-07-31 VITALS — BP 154/97 | HR 87 | Ht 66.0 in | Wt 231.4 lb

## 2022-07-31 DIAGNOSIS — L989 Disorder of the skin and subcutaneous tissue, unspecified: Secondary | ICD-10-CM

## 2022-07-31 NOTE — Progress Notes (Signed)
Referring Provider [address], [place],  [place] [id number]   CC:  Chief Complaint  Patient presents with   Consult      Amy [name] is an 72 y.o. female.  HPI: Amy [name] is a very nice 72 year old female who presents today for evaluation of a skin tag on the inner right thigh with some fullness in the skin underneath it and a skin tag on the inner portion of her left thigh.  The mass on the thigh has been present since 2022.  No Known Allergies  Outpatient Encounter Medications as of 07/31/2022  Medication Sig Note   acetaminophen (TYLENOL) 500 MG tablet Take 1 tablet (500 mg total) by mouth every 6 (six) hours as needed.    acetaminophen-codeine (TYLENOL #3) 300-30 MG tablet Take 1 tablet by mouth every 6 (six) hours as needed (pain).    aspirin EC 325 MG tablet Take 325 mg by mouth daily as needed for mild pain.    DOCUSATE SODIUM PO Take 2 capsules by mouth daily as needed (constipation).    empagliflozin (JARDIANCE) 10 MG TABS tablet Take 1 tablet (10 mg total) by mouth daily before breakfast.    fluticasone furoate-vilanterol (BREO ELLIPTA) 200-25 MCG/ACT AEPB Inhale 1 puff into the lungs daily. (Patient taking differently: Inhale 1 puff into the lungs daily as needed (trouble breathing).) 02/20/2022: Pt states she only takes as needed due to cost of the medication. Used last dose today.   ibuprofen (ADVIL) 600 MG tablet Take 1 tablet (600 mg total) by mouth every 6 (six) hours as needed.    magnesium citrate SOLN Take 1 Bottle by mouth once as needed for severe constipation. 02/20/2022: Pt states this is the only medication that helps with her constipation but has been on and off backorder.   metFORMIN (GLUCOPHAGE) 1000 MG tablet Take 1,000 mg by mouth daily. 02/20/2022: Pt states she is taking this medication. No fill history found on dispense report/Dr. Brantley Stage.   methocarbamol (ROBAXIN) 500 MG tablet Take 1 tablet (500 mg total) by mouth at  bedtime.    Multiple Vitamin (MULTIVITAMIN) tablet Take 1 tablet by mouth daily.    ondansetron (ZOFRAN) 4 MG tablet Take 1 tablet (4 mg total) by mouth every 6 (six) hours.    polyethylene glycol powder (GLYCOLAX/MIRALAX) 17 GM/SCOOP powder Take 17 g by mouth daily as needed (constipation).    No facility-administered encounter medications on file as of 07/31/2022.     Past Medical History:  Diagnosis Date   Colon polyp, hyperplastic 03/29/2010   Diabetes mellitus    History of kidney stones    Mild intermittent asthma     Past Surgical History:  Procedure Laterality Date   CYST EXCISION Left 15 years ago   Axillary    ECTOPIC PREGNANCY SURGERY     OVARIAN CYST REMOVAL  35 years ago   TOTAL ABDOMINAL HYSTERECTOMY  1984    Family History  Problem Relation Age of Onset   Stomach cancer Maternal Grandmother    Heart disease Maternal Grandfather    Heart attack Maternal Grandfather    Diabetes Father    Hypertension Mother    Diabetes Brother    Hypertension Sister    Allergies Sister    Allergies Daughter     Social History   Social History Narrative   Not on file     Review of Systems General: Denies fevers, chills, weight loss CV: Denies chest pain, shortness of  breath, palpitations Skin: Skin tags which rub against her clothes.  Physical Exam    07/31/2022    9:34 AM 07/05/2022    9:50 AM 07/05/2022    9:48 AM  Vitals with BMI  Height 5\' 6"   5\' 6"   Weight 231 lbs 6 oz  226 lbs  BMI 123456  [id number]  Systolic 123456 [id number]   Diastolic 97 88   Pulse 87 69     General:  No acute distress,  Alert and oriented, Non-Toxic, Normal speech and affect Integument: Patient has a 3 mm skin tag on the inner right thigh.  I do not feel a discrete mass underneath the skin tag but there is a fullness in the skin which could be excised with the skin tag.  There is a simple skin tag approximately 2 mm in size on the inner left thigh. Mammogram: Mammogram from April 2023 was BI-RADS  1 Assessment/Plan Skin tags bilateral inner thighs: I believe these can be removed in the office under local anesthesia.  The skin tag on the right will require excision into the skin to remove the fullness under the skin tag.  The skin tag on the left will be a simple shave.  Will schedule for an office procedure.  Amy [name] 07/31/2022, 10:06 AM

## 2022-08-14 ENCOUNTER — Other Ambulatory Visit: Payer: Self-pay | Admitting: Emergency Medicine

## 2022-08-14 ENCOUNTER — Telehealth: Payer: Self-pay | Admitting: Emergency Medicine

## 2022-08-14 DIAGNOSIS — E1165 Type 2 diabetes mellitus with hyperglycemia: Secondary | ICD-10-CM

## 2022-08-14 MED ORDER — METFORMIN HCL 1000 MG PO TABS: 1000.0000 mg | ORAL_TABLET | Freq: Two times a day (BID) | ORAL | 3 refills | Status: DC

## 2022-08-14 NOTE — Telephone Encounter (Signed)
PT calls today in today following up on their RX of empagliflozin (JARDIANCE) 10 MG TABS tablet . PT notes that they were not able to get this medication due to their insurance not covering it. She has been only taking the metFORMIN (GLUCOPHAGE) 1000 MG tablet since then and is now down to only 4 tablets (she had thought the pharmacy would but in a new script for the metFORMIN since she didn't take the [facility]).   CB if needed: [phone]

## 2022-08-14 NOTE — Telephone Encounter (Signed)
New prescription for metformin sent to pharmacy of record.  However, does Jardiance need PA?  Wilder Glade is not covered by her insurance but Vania Rea should be.  Thanks.

## 2022-08-18 NOTE — Telephone Encounter (Signed)
Called patient to inform her of provider response

## 2022-08-18 NOTE — Telephone Encounter (Signed)
It is all about nutrition.  Hope she is eating better.  Thanks.

## 2022-08-18 NOTE — Telephone Encounter (Signed)
Called patient and even with prior auth, insurance will not pay for the Blanco or Farxiga,  patient states she does not want to take any other medication she is going to just keep taking her metformin.

## 2022-09-03 ENCOUNTER — Ambulatory Visit: Payer: Medicare HMO | Admitting: Plastic Surgery

## 2022-09-03 VITALS — BP 133/81 | HR 76

## 2022-09-03 DIAGNOSIS — D2271 Melanocytic nevi of right lower limb, including hip: Secondary | ICD-10-CM

## 2022-09-03 DIAGNOSIS — L918 Other hypertrophic disorders of the skin: Secondary | ICD-10-CM | POA: Diagnosis not present

## 2022-09-03 DIAGNOSIS — L989 Disorder of the skin and subcutaneous tissue, unspecified: Secondary | ICD-10-CM

## 2022-09-03 NOTE — Addendum Note (Signed)
Addended by: Weyman Croon on: 09/03/2022 10:46 AM   Modules accepted: Orders

## 2022-09-03 NOTE — Progress Notes (Signed)
Procedure Note  Preoperative Dx: Bilateral thigh skin tags  Postoperative Dx: Same  Procedure: Excision of bilateral thigh skin tags, right thigh 5 mm skin tag with a small fatty mass at the base.  Left thigh 2 mm simple skin tag.  Anesthesia: Lidocaine 1% with 1:100,000 epinephrine and 0.25% Sensorcaine   Indication for Procedure: Removal for treatment of discomfort and pathologic diagnosis.  Description of Procedure: Risks and complications were explained to the patient including recurrence and the need for additional surgery based on pathology..  Consent was confirmed and the patient understands the risks and benefits.  The potential complications and alternatives were explained and the patient consents.  The patient expressed understanding the option of not having the procedure and the risks of a scar.  Time out was called and all information was confirmed to be correct.    The area was prepped and drapped.  Local anesthetic was injected in the subcutaneous tissues.  After waiting for the local to take affect the right thigh was addressed first.  An elliptical incision was made at the base of the skin tag and the fatty tissue underneath of the skin tag was excised along with the pedunculated skin.  The fat measured approximately 1 x 1 cm.  After obtaining hemostasis, the surgical wound was closed with interrupted 3-0 Monocryl sutures in the dermis and a running 4-0 Monocryl baseball stitch.  The skin tag on the left thigh was shaved..  The surgical wound measured 1 cm.  A dressing was applied.  The patient was given instructions on how to care for the area and a follow up appointment.  Amy [name] tolerated the procedure well and there were no complications. The right thigh specimen was sent to pathology.

## 2022-09-11 ENCOUNTER — Ambulatory Visit (INDEPENDENT_AMBULATORY_CARE_PROVIDER_SITE_OTHER): Payer: Medicare HMO | Admitting: Student

## 2022-09-11 DIAGNOSIS — L918 Other hypertrophic disorders of the skin: Secondary | ICD-10-CM

## 2022-09-11 DIAGNOSIS — D2271 Melanocytic nevi of right lower limb, including hip: Secondary | ICD-10-CM

## 2022-09-11 DIAGNOSIS — L989 Disorder of the skin and subcutaneous tissue, unspecified: Secondary | ICD-10-CM

## 2022-09-11 NOTE — Progress Notes (Signed)
Patient is a 72 year old female with history of bilateral thigh skin tags.  Patient underwent excision of right thigh skin tag with a small fatty mass at the base as well as excision of a left thigh skin tag with Dr. Ladona Ridgel on 09/03/2022.  During the procedure, and an elliptical incision was made at the base of the right thigh skin tag in the fatty tissue underneath the skin tag was excised along the pedunculated skin.  The surgical wound was closed with interrupted 3-0 Monocryl sutures in the dermis and a running 4-0 Monocryl baseball stitch.  The skin tag on the left thigh was shaved.  The right thigh specimen was sent to pathology.  The pathology showed nevus lipomatosis superficialis.  Patient presents to the clinic today for postprocedural follow-up.  Today, patient reports she is doing well.  She denies any issues or concerns with the procedure site.  She denies any drainage from the area.  She denies any fevers or chills.  Patient reports she has been applying hydrogen peroxide to the incision.  She states that she would like to have the sutures dissolve on their own rather than being removed.  I discussed the results of the pathology with the patient.  Chaperone present on exam.  On exam, patient is sitting upright in no acute distress.  The incision to the right thigh is intact.  There appears to be some very mild irritation to the incision, but overall is healing well.  There is no drainage or signs of infection on exam.  The left skin tag removal site has completely healed.  I discussed with the patient that I would like her to stop applying hydroperoxide or alcohol to the incision and out of her to apply Vaseline to the incision once daily.  I discussed with the patient that she can shower allowing soapy water to run over the area and then she can pat it dry.  I discussed with her that if she would like, she may transition to scar creams in a few weeks.  Patient expressed understanding.  Patient  to follow-up as needed.

## 2022-09-16 ENCOUNTER — Encounter: Payer: Self-pay | Admitting: Emergency Medicine

## 2022-09-16 ENCOUNTER — Ambulatory Visit (INDEPENDENT_AMBULATORY_CARE_PROVIDER_SITE_OTHER): Payer: Medicare HMO | Admitting: Emergency Medicine

## 2022-09-16 VITALS — BP 128/88 | HR 78 | Temp 98.6°F | Ht 66.0 in | Wt 230.1 lb

## 2022-09-16 DIAGNOSIS — J4520 Mild intermittent asthma, uncomplicated: Secondary | ICD-10-CM | POA: Diagnosis not present

## 2022-09-16 DIAGNOSIS — E1165 Type 2 diabetes mellitus with hyperglycemia: Secondary | ICD-10-CM

## 2022-09-16 DIAGNOSIS — Z7984 Long term (current) use of oral hypoglycemic drugs: Secondary | ICD-10-CM

## 2022-09-16 DIAGNOSIS — Z1211 Encounter for screening for malignant neoplasm of colon: Secondary | ICD-10-CM | POA: Diagnosis not present

## 2022-09-16 LAB — POCT GLYCOSYLATED HEMOGLOBIN (HGB A1C): Hemoglobin A1C: 7.2 % — AB (ref 4.0–5.6)

## 2022-09-16 MED ORDER — FREESTYLE LIBRE 3 SENSOR MISC: 1.0000 "application " | Freq: Two times a day (BID) | 5 refills | Status: DC | PRN

## 2022-09-16 MED ORDER — FREESTYLE LIBRE 3 READER DEVI: 1.0000 | Freq: Two times a day (BID) | 7 refills | Status: DC | PRN

## 2022-09-16 NOTE — Assessment & Plan Note (Signed)
Well-controlled.  No concerns. Continues daily Breo Ellipta 1 puff.

## 2022-09-16 NOTE — Progress Notes (Signed)
Amy [name] 72 y.o.   Chief Complaint  Patient presents with   Medical Management of Chronic Issues    f/u appt, no concerns patient wants cholesterol checked     HISTORY OF PRESENT ILLNESS: This is a 72 y.o. female here for 41-month follow-up of diabetes Unable to get Jardiance due to cost No complaints or any other medical concerns today. Wt Readings from Last 3 Encounters:  09/16/22 230 lb 2 oz (104.4 kg)  07/31/22 231 lb 6.4 oz (105 kg)  07/05/22 226 lb (102.5 kg)   Lab Results  Component Value Date   HGBA1C 7.3 (A) 06/17/2022     HPI   Prior to Admission medications   Medication Sig Start Date End Date Taking? Authorizing Provider  acetaminophen (TYLENOL) 500 MG tablet Take 1 tablet (500 mg total) by mouth every 6 (six) hours as needed. 07/05/22  Yes Redwine, Madison A, PA-C  acetaminophen-codeine (TYLENOL #3) 300-30 MG tablet Take 1 tablet by mouth every 6 (six) hours as needed (pain).   Yes [provider]  aspirin EC 325 MG tablet Take 325 mg by mouth daily as needed for mild pain.   Yes [provider]  DOCUSATE SODIUM PO Take 2 capsules by mouth daily as needed (constipation).   Yes [provider]  empagliflozin (JARDIANCE) 10 MG TABS tablet Take 1 tablet (10 mg total) by mouth daily before breakfast. 06/17/22  Yes Ahtziry Saathoff, Eilleen Kempf, MD  fluticasone furoate-vilanterol (BREO ELLIPTA) 200-25 MCG/ACT AEPB Inhale 1 puff into the lungs daily. Patient taking differently: Inhale 1 puff into the lungs daily as needed (trouble breathing). 09/16/21  Yes Burns, Bobette Mo, MD  ibuprofen (ADVIL) 600 MG tablet Take 1 tablet (600 mg total) by mouth every 6 (six) hours as needed. 07/05/22  Yes Redwine, Madison A, PA-C  magnesium citrate SOLN Take 1 Bottle by mouth once as needed for severe constipation.   Yes [provider]  metFORMIN (GLUCOPHAGE) 1000 MG tablet Take 1 tablet (1,000 mg total) by mouth 2 (two) times daily with a meal. 08/14/22   Yes Niclas Markell, Eilleen Kempf, MD  methocarbamol (ROBAXIN) 500 MG tablet Take 1 tablet (500 mg total) by mouth at bedtime. 07/05/22  Yes Redwine, Madison A, PA-C  Multiple Vitamin (MULTIVITAMIN) tablet Take 1 tablet by mouth daily.   Yes [provider]  ondansetron (ZOFRAN) 4 MG tablet Take 1 tablet (4 mg total) by mouth every 6 (six) hours. 07/05/22  Yes Redwine, Madison A, PA-C  polyethylene glycol powder (GLYCOLAX/MIRALAX) 17 GM/SCOOP powder Take 17 g by mouth daily as needed (constipation).   Yes [provider]    No Known Allergies  Patient Active Problem List   Diagnosis Date Noted   Hemorrhoids 12/18/2020   Abnormal EKG 04/27/2019   Mild intermittent asthma 10/26/2017   Morbid (severe) obesity due to excess calories (HCC) 09/29/2016   Diabetes (HCC) 08/10/2013   History of kidney stones    Colon polyp, hyperplastic 03/29/2010    Past Medical History:  Diagnosis Date   Colon polyp, hyperplastic 03/29/2010   Diabetes mellitus    History of kidney stones    Mild intermittent asthma     Past Surgical History:  Procedure Laterality Date   CYST EXCISION Left 15 years ago   Axillary    ECTOPIC PREGNANCY SURGERY     OVARIAN CYST REMOVAL  35 years ago   TOTAL ABDOMINAL HYSTERECTOMY  1984    Social History   Socioeconomic History  Marital status: Married    Spouse name: Not on file   Number of children: 2   Years of education: Not on file   Highest education level: Not on file  Occupational History    Employer: [employer]  Tobacco Use   Smoking status: Former    Packs/day: 0.50    Years: 12.00    Additional pack years: 0.00    Total pack years: 6.00    Types: Cigarettes    Quit date: 05/12/1980    Years since quitting: 42.3   Smokeless tobacco: Never  Vaping Use   Vaping Use: Never used  Substance and Sexual Activity   Alcohol use: Yes    Alcohol/week: 0.0 standard drinks of alcohol    Comment: "Birthdays"   Drug use: No   Sexual  activity: Yes    Partners: Male    Birth control/protection: Surgical    Comment: Hysterectomy  Other Topics Concern   Not on file  Social History Narrative   Not on file   Social Determinants of Health   Financial Resource Strain: Low Risk  (04/16/2022)   Overall Financial Resource Strain (CARDIA)    Difficulty of Paying Living Expenses: Not hard at all  Food Insecurity: No Food Insecurity (04/16/2022)   Hunger Vital Sign    Worried About Running Out of Food in the Last Year: Never true    Ran Out of Food in the Last Year: Never true  Transportation Needs: No Transportation Needs (04/16/2022)   PRAPARE - Administrator, Civil Service (Medical): No    Lack of Transportation (Non-Medical): No  Physical Activity: Sufficiently Active (04/16/2022)   Exercise Vital Sign    Days of Exercise per Week: 5 days    Minutes of Exercise per Session: 30 min  Stress: No Stress Concern Present (04/16/2022)   [employer] of Occupational Health - Occupational Stress Questionnaire    Feeling of Stress : Not at all  Social Connections: Socially Integrated (04/16/2022)   Social Connection and Isolation Panel [NHANES]    Frequency of Communication with Friends and Family: More than three times a week    Frequency of Social Gatherings with Friends and Family: More than three times a week    Attends Religious Services: More than 4 times per year    Active Member of [employer] or Organizations: Yes    Attends Engineer, structural: More than 4 times per year    Marital Status: Married  Catering manager Violence: Not At Risk (04/16/2022)   Humiliation, Afraid, Rape, and Kick questionnaire    Fear of Current or Ex-Partner: No    Emotionally Abused: No    Physically Abused: No    Sexually Abused: No    Family History  Problem Relation Age of Onset   Stomach cancer Maternal Grandmother    Heart disease Maternal Grandfather    Heart attack Maternal Grandfather    Diabetes Father     Hypertension Mother    Diabetes Brother    Hypertension Sister    Allergies Sister    Allergies Daughter      Review of Systems  Constitutional: Negative.  Negative for chills and fever.  HENT: Negative.  Negative for congestion and sore throat.   Respiratory: Negative.  Negative for cough and shortness of breath.   Cardiovascular: Negative.  Negative for chest pain and palpitations.  Gastrointestinal:  Negative for abdominal pain, diarrhea, nausea and vomiting.  Genitourinary: Negative.  Negative for dysuria and  hematuria.  Skin: Negative.  Negative for rash.  Neurological: Negative.  Negative for dizziness and headaches.  All other systems reviewed and are negative.   Vitals:   09/16/22 1002  BP: 128/88  Pulse: 78  Temp: 98.6 F (37 C)  SpO2: 98%    Physical Exam Vitals reviewed.  Constitutional:      Appearance: Normal appearance.  HENT:     Head: Normocephalic.  Eyes:     Extraocular Movements: Extraocular movements intact.     Pupils: Pupils are equal, round, and reactive to light.  Cardiovascular:     Rate and Rhythm: Normal rate and regular rhythm.     Pulses: Normal pulses.     Heart sounds: Normal heart sounds.  Pulmonary:     Effort: Pulmonary effort is normal.     Breath sounds: Normal breath sounds.  Skin:    General: Skin is warm and dry.     Capillary Refill: Capillary refill takes less than 2 seconds.  Neurological:     General: No focal deficit present.     Mental Status: She is alert and oriented to person, place, and time.  Psychiatric:        Mood and Affect: Mood normal.        Behavior: Behavior normal.      ASSESSMENT & PLAN: A total of 41 minutes was spent with the patient and counseling/coordination of care regarding preparing for this visit, review of most recent office visit notes, review of chronic medical conditions and their management, review of most recent blood work results including interpretation of today's hemoglobin A1c,  review of all medications, cardiovascular risks associated with uncontrolled diabetes, education on nutrition, prognosis, documentation, and need for follow-up.  Problem List Items Addressed This Visit       Respiratory   Mild intermittent asthma    Well-controlled.  No concerns. Continues daily Breo Ellipta 1 puff.        Endocrine   Diabetes (HCC) - Primary    Hemoglobin A1c still not at goal at 7.2. Was not able to get Jardiance due to cost of medication.  Not covered by her insurance. Continues taking metformin 1000 mg twice a day Cardiovascular risk associated with uncontrolled diabetes discussed. Diet and nutrition discussed. Wants to follow-up in 6 months.      Relevant Medications   Continuous Glucose Receiver (FREESTYLE LIBRE 3 READER) DEVI   Continuous Glucose Sensor (FREESTYLE LIBRE 3 SENSOR) MISC   Other Relevant Orders   POCT HgB A1C (Completed)   Comprehensive metabolic panel   Lipid panel     Other   Morbid (severe) obesity due to excess calories (HCC)    Diet and nutrition discussed Benefits of exercise discussed Advised to decrease amount of daily carbohydrate intake and daily calories and increase amount of plant-based protein in her diet.      Other Visit Diagnoses     Screening for colon cancer       Relevant Orders   Cologuard        Patient Instructions  Diabetes Mellitus and Nutrition, Adult When you have diabetes, or diabetes mellitus, it is very important to have healthy eating habits because your blood sugar (glucose) levels are greatly affected by what you eat and drink. Eating healthy foods in the right amounts, at about the same times every day, can help you: Manage your blood glucose. Lower your risk of heart disease. Improve your blood pressure. Reach or maintain a healthy weight. What can  affect my meal plan? Every person with diabetes is different, and each person has different needs for a meal plan. Your health care provider  may recommend that you work with a dietitian to make a meal plan that is best for you. Your meal plan may vary depending on factors such as: The calories you need. The medicines you take. Your weight. Your blood glucose, blood pressure, and cholesterol levels. Your activity level. Other health conditions you have, such as heart or kidney disease. How do carbohydrates affect me? Carbohydrates, also called carbs, affect your blood glucose level more than any other type of food. Eating carbs raises the amount of glucose in your blood. It is important to know how many carbs you can safely have in each meal. This is different for every person. Your dietitian can help you calculate how many carbs you should have at each meal and for each snack. How does alcohol affect me? Alcohol can cause a decrease in blood glucose (hypoglycemia), especially if you use insulin or take certain diabetes medicines by mouth. Hypoglycemia can be a life-threatening condition. Symptoms of hypoglycemia, such as sleepiness, dizziness, and confusion, are similar to symptoms of having too much alcohol. Do not drink alcohol if: Your health care provider tells you not to drink. You are pregnant, may be pregnant, or are planning to become pregnant. If you drink alcohol: Limit how much you have to: 0-1 drink a day for women. 0-2 drinks a day for men. Know how much alcohol is in your drink. In the [place] one drink equals one 12 oz bottle of beer (355 mL), one 5 oz glass of wine (148 mL), or one 1 oz glass of hard liquor (44 mL). Keep yourself hydrated with water, diet soda, or unsweetened iced tea. Keep in mind that regular soda, juice, and other mixers may contain a lot of sugar and must be counted as carbs. What are tips for following this plan?  Reading food labels Start by checking the serving size on the Nutrition Facts label of packaged foods and drinks. The number of calories and the amount of carbs, fats, and other  nutrients listed on the label are based on one serving of the item. Many items contain more than one serving per package. Check the total grams (g) of carbs in one serving. Check the number of grams of saturated fats and trans fats in one serving. Choose foods that have a low amount or none of these fats. Check the number of milligrams (mg) of salt (sodium) in one serving. Most people should limit total sodium intake to less than 2,300 mg per day. Always check the nutrition information of foods labeled as "low-fat" or "nonfat." These foods may be higher in added sugar or refined carbs and should be avoided. Talk to your dietitian to identify your daily goals for nutrients listed on the label. Shopping Avoid buying canned, pre-made, or processed foods. These foods tend to be high in fat, sodium, and added sugar. Shop around the outside edge of the grocery store. This is where you will most often find fresh fruits and vegetables, bulk grains, fresh meats, and fresh dairy products. Cooking Use low-heat cooking methods, such as baking, instead of high-heat cooking methods, such as deep frying. Cook using healthy oils, such as olive, canola, or sunflower oil. Avoid cooking with butter, cream, or high-fat meats. Meal planning Eat meals and snacks regularly, preferably at the same times every day. Avoid going long periods of time without eating.  Eat foods that are high in fiber, such as fresh fruits, vegetables, beans, and whole grains. Eat 4-6 oz (112-168 g) of lean protein each day, such as lean meat, chicken, fish, eggs, or tofu. One ounce (oz) (28 g) of lean protein is equal to: 1 oz (28 g) of meat, chicken, or fish. 1 egg.  cup (62 g) of tofu. Eat some foods each day that contain healthy fats, such as avocado, nuts, seeds, and fish. What foods should I eat? Fruits Berries. Apples. Oranges. Peaches. Apricots. Plums. Grapes. Mangoes. Papayas. Pomegranates. Kiwi. Cherries. Vegetables Leafy  greens, including lettuce, spinach, kale, chard, collard greens, mustard greens, and cabbage. Beets. Cauliflower. Broccoli. Carrots. Green beans. Tomatoes. Peppers. Onions. Cucumbers. Brussels sprouts. Grains Whole grains, such as whole-wheat or whole-grain bread, crackers, tortillas, cereal, and pasta. Unsweetened oatmeal. Quinoa. Brown or wild rice. Meats and other proteins Seafood. Poultry without skin. Lean cuts of poultry and beef. Tofu. Nuts. Seeds. Dairy Low-fat or fat-free dairy products such as milk, yogurt, and cheese. The items listed above may not be a complete list of foods and beverages you can eat and drink. Contact a dietitian for more information. What foods should I avoid? Fruits Fruits canned with syrup. Vegetables Canned vegetables. Frozen vegetables with butter or cream sauce. Grains Refined white flour and flour products such as bread, pasta, snack foods, and cereals. Avoid all processed foods. Meats and other proteins Fatty cuts of meat. Poultry with skin. Breaded or fried meats. Processed meat. Avoid saturated fats. Dairy Full-fat yogurt, cheese, or milk. Beverages Sweetened drinks, such as soda or iced tea. The items listed above may not be a complete list of foods and beverages you should avoid. Contact a dietitian for more information. Questions to ask a health care provider Do I need to meet with a certified diabetes care and education specialist? Do I need to meet with a dietitian? What number can I call if I have questions? When are the best times to check my blood glucose? Where to find more information: American Diabetes Association: diabetes.org Academy of Nutrition and Dietetics: eatright.[employer] of Diabetes and Digestive and Kidney Diseases: StageSync.si Association of Diabetes Care & Education Specialists: diabeteseducator.org Summary It is important to have healthy eating habits because your blood sugar (glucose) levels are greatly  affected by what you eat and drink. It is important to use alcohol carefully. A healthy meal plan will help you manage your blood glucose and lower your risk of heart disease. Your health care provider may recommend that you work with a dietitian to make a meal plan that is best for you. This information is not intended to replace advice given to you by your health care provider. Make sure you discuss any questions you have with your health care provider. Document Revised: 11/30/2019 Document Reviewed: 11/30/2019 Elsevier Patient Education  2023 Elsevier Inc.    Edwina Barth, MD [facility]

## 2022-09-16 NOTE — Patient Instructions (Signed)

## 2022-09-16 NOTE — Assessment & Plan Note (Signed)
Hemoglobin A1c still not at goal at 7.2. Was not able to get Jardiance due to cost of medication.  Not covered by her insurance. Continues taking metformin 1000 mg twice a day Cardiovascular risk associated with uncontrolled diabetes discussed. Diet and nutrition discussed. Wants to follow-up in 6 months.

## 2022-09-16 NOTE — Assessment & Plan Note (Signed)
Diet and nutrition discussed Benefits of exercise discussed Advised to decrease amount of daily carbohydrate intake and daily calories and increase amount of plant based protein in her diet. 

## 2022-09-17 LAB — LIPID PANEL
Cholesterol: 185 mg/dL (ref <200)
HDL: 63 mg/dL (ref >=50)
LDL Cholesterol (Calc): 99 mg/dL (calc)
Non-HDL Cholesterol (Calc): 122 mg/dL (calc) (ref <130)
Total CHOL/HDL Ratio: 2.9 (calc) (ref <5.0)
Triglycerides: 135 mg/dL (ref <150)

## 2022-09-17 LAB — COMPREHENSIVE METABOLIC PANEL
AG Ratio: 1.4 (calc) (ref 1.0–2.5)
ALT: 19 U/L (ref 6–29)
AST: 20 U/L (ref 10–35)
Albumin: 4.4 g/dL (ref 3.6–5.1)
Alkaline phosphatase (APISO): 66 U/L (ref 37–153)
BUN: 7 mg/dL (ref 7–25)
CO2: 29 mmol/L (ref 20–32)
Calcium: 10.1 mg/dL (ref 8.6–10.4)
Chloride: 102 mmol/L (ref 98–110)
Creat: 0.57 mg/dL (ref 0.50–1.05)
Globulin: 3.2 g/dL (calc) (ref 1.9–3.7)
Glucose, Bld: 152 mg/dL — ABNORMAL HIGH (ref 65–99)
Potassium: 4.3 mmol/L (ref 3.5–5.3)
Sodium: 139 mmol/L (ref 135–146)
Total Bilirubin: 0.5 mg/dL (ref 0.2–1.2)
Total Protein: 7.6 g/dL (ref 6.1–8.1)

## 2022-09-18 ENCOUNTER — Telehealth: Payer: Self-pay | Admitting: Emergency Medicine

## 2022-09-18 NOTE — Telephone Encounter (Signed)
Patient was prescribed Continuous Glucose Sensor (FREESTYLE LIBRE 3 SENSOR) MISC and was told she needs a prior authorization. Best callback is [phone].

## 2022-09-19 ENCOUNTER — Other Ambulatory Visit (HOSPITAL_COMMUNITY): Payer: Self-pay

## 2022-09-23 ENCOUNTER — Telehealth: Payer: Self-pay

## 2022-09-23 ENCOUNTER — Other Ambulatory Visit (HOSPITAL_COMMUNITY): Payer: Self-pay

## 2022-09-23 NOTE — Telephone Encounter (Signed)
Pharmacy Patient Advocate Encounter   Received notification that prior authorization for [facility] 3 sensor is required/requested.   Per test claim, refill too soon, last filled 09/16/22 and next fill 10/06/22  Prior authorization not submitted.

## 2022-09-24 ENCOUNTER — Ambulatory Visit: Payer: Medicare HMO

## 2022-09-24 ENCOUNTER — Other Ambulatory Visit: Payer: Self-pay | Admitting: *Deleted

## 2022-09-24 ENCOUNTER — Telehealth: Payer: Self-pay | Admitting: Emergency Medicine

## 2022-09-24 DIAGNOSIS — E1165 Type 2 diabetes mellitus with hyperglycemia: Secondary | ICD-10-CM

## 2022-09-24 MED ORDER — FREESTYLE LIBRE 3 SENSOR MISC: 5 refills | Status: AC

## 2022-09-24 MED ORDER — FREESTYLE LIBRE 3 READER DEVI: 1.0000 | Freq: Once | 0 refills | Status: AC

## 2022-09-24 NOTE — Telephone Encounter (Signed)
Patient states that pharmacy never got the free style libre sensor or machine.  Please resend

## 2022-09-24 NOTE — Telephone Encounter (Signed)
Sent prescriptions to patient pharmacy

## 2022-09-24 NOTE — Progress Notes (Signed)
Patient education given today on freestyle libre 3 sensor and the patient expresses understanding and acceptance of instructions. Patient was able to demonstrate on herself successfully, was advise to come back later with the reader to also demonstrate it for her.  Amy [name] 09/24/2022 9:27 AM

## 2022-09-25 LAB — COLOGUARD

## 2022-11-17 IMAGING — MG MM DIGITAL SCREENING BILAT W/ TOMO AND CAD
6 of 10 series · 6 of 30 positions shown · non-contrast
Comparison: Previous exam(s).

CLINICAL DATA: Screening.

EXAM:
DIGITAL SCREENING BILATERAL MAMMOGRAM WITH TOMOSYNTHESIS AND CAD
TECHNIQUE: Bilateral screening digital craniocaudal and mediolateral oblique
mammograms were obtained. Bilateral screening digital breast
tomosynthesis was performed. The images were evaluated with
computer-aided detection.

[L CC synth-2D]
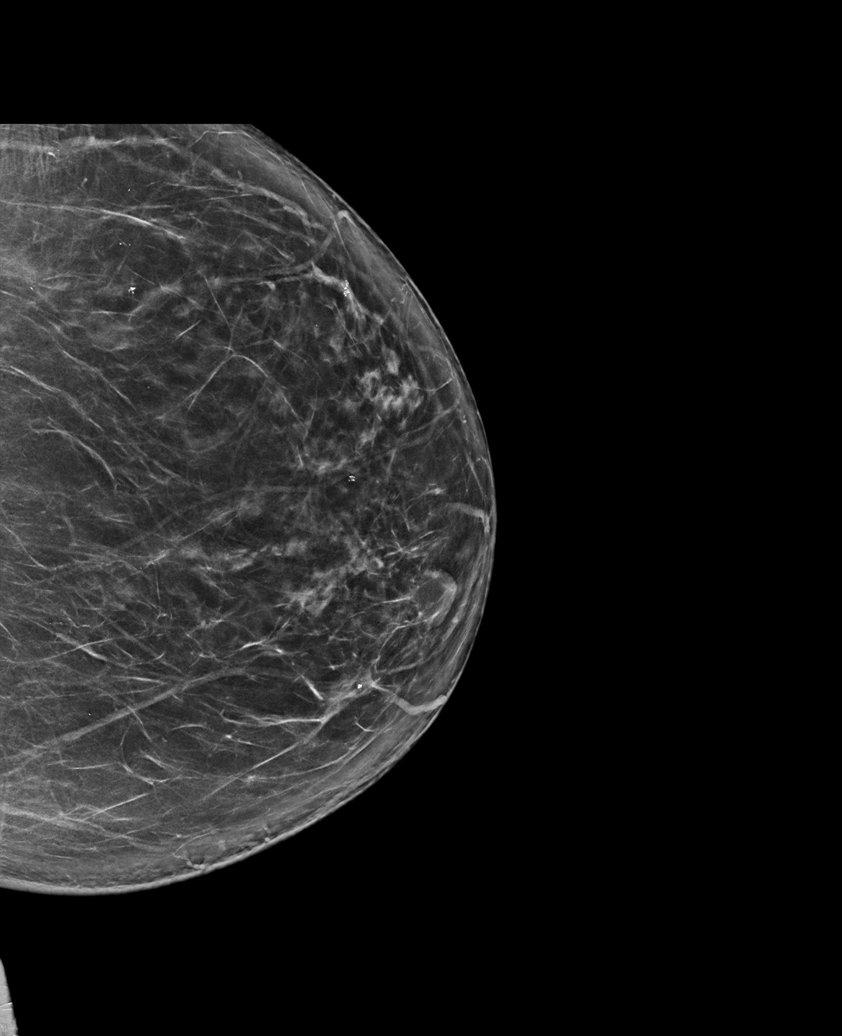

[L MLO synth-2D]
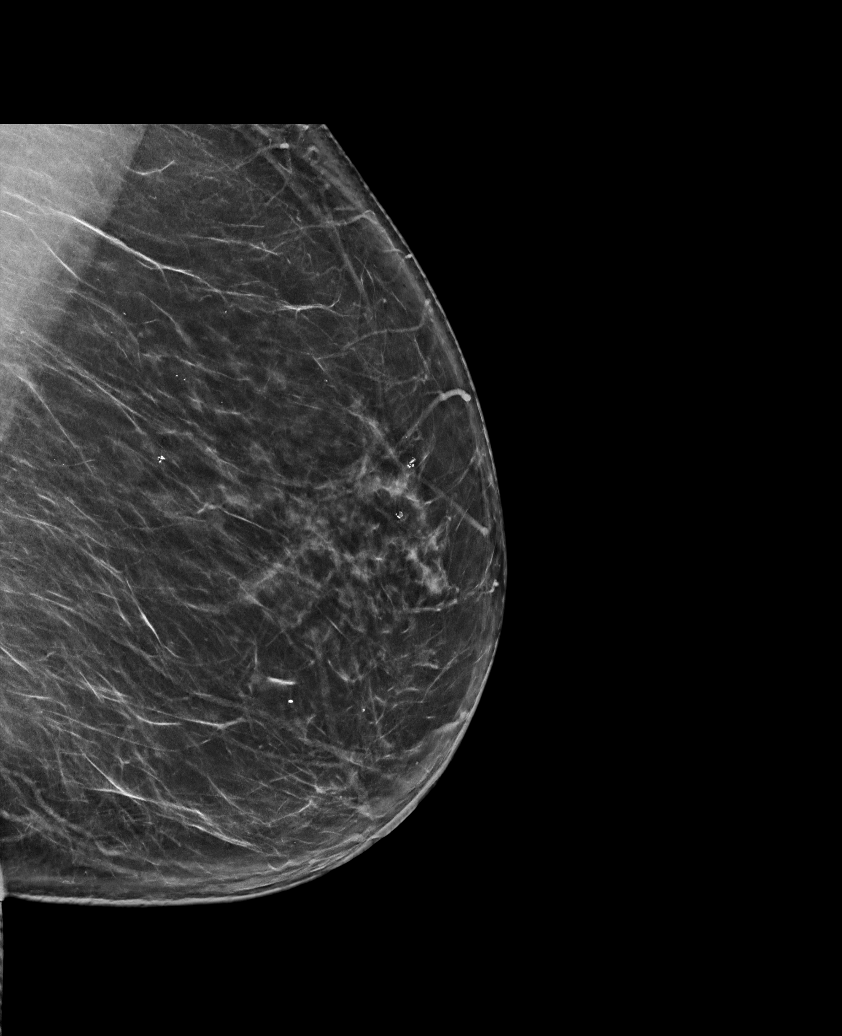

[R CC synth-2D]
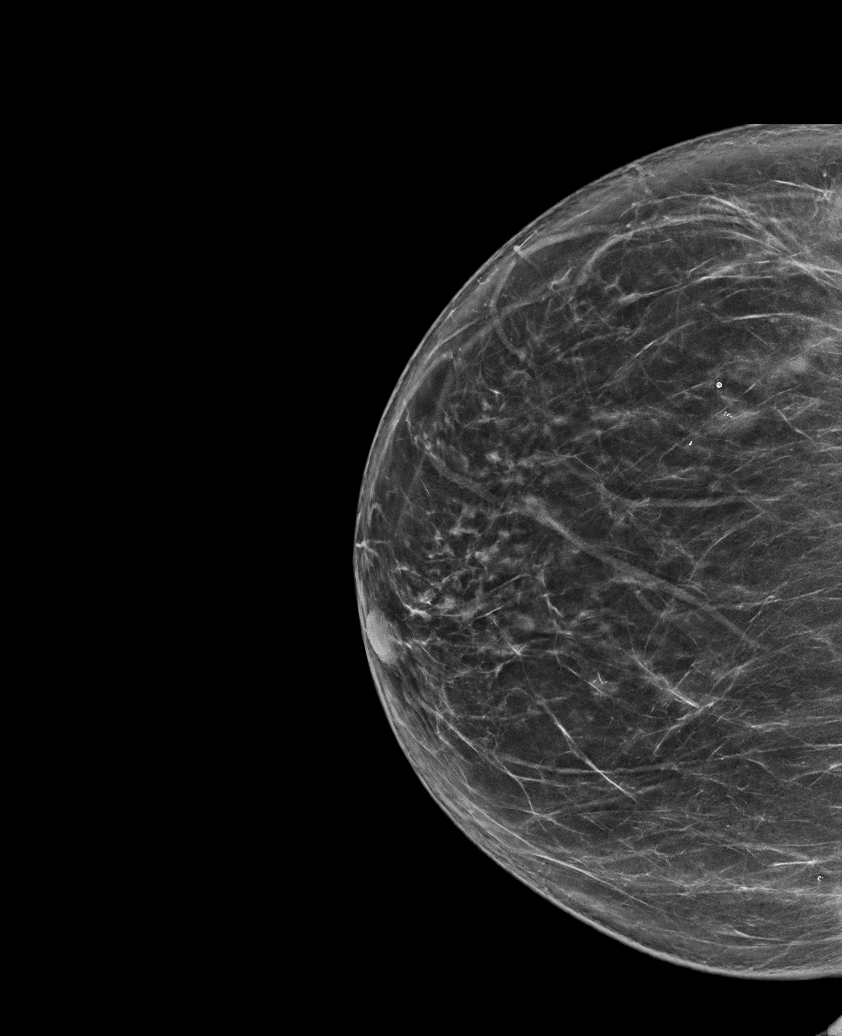

[R MLO synth-2D (1 of 2)]
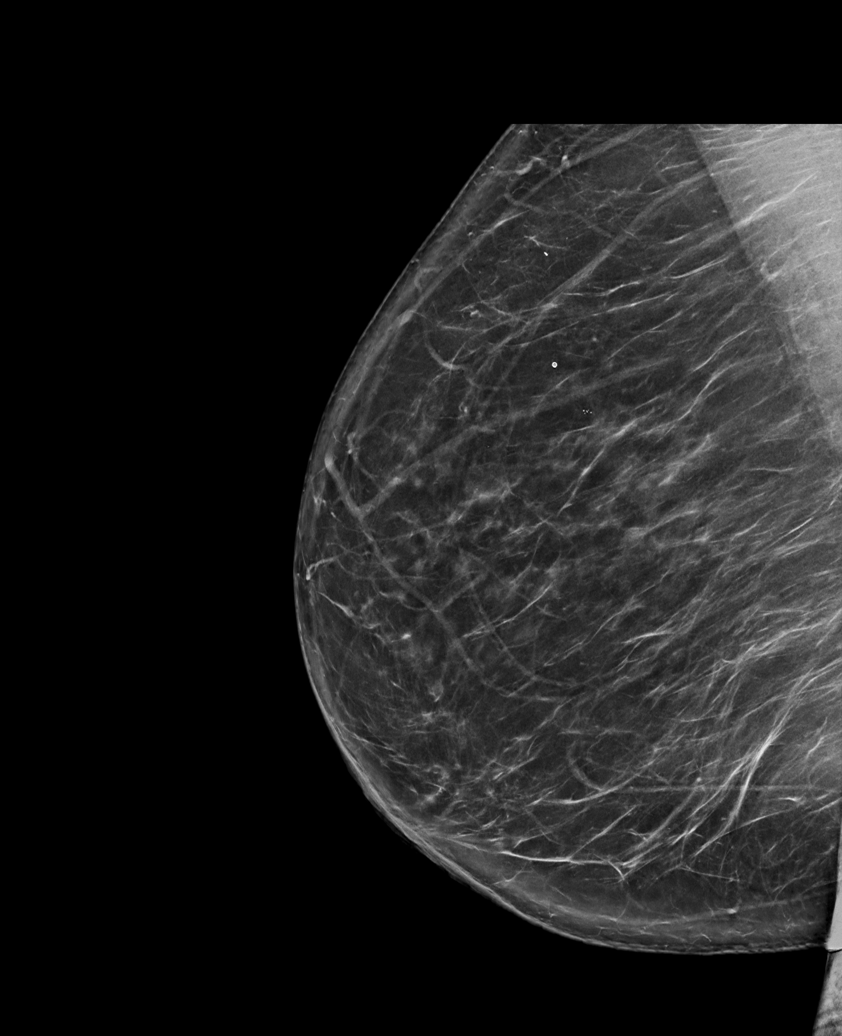

[R MLO synth-2D (2 of 2)]
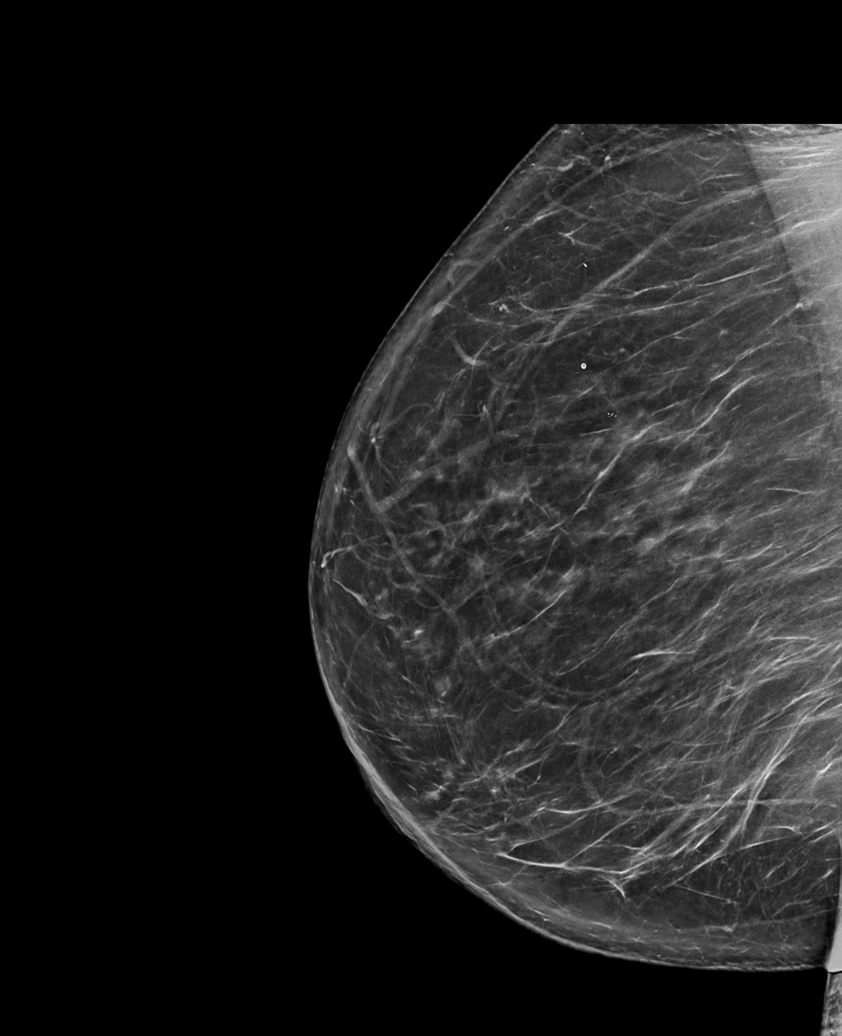

[R CC tomo · tomo slice 43/86.0]
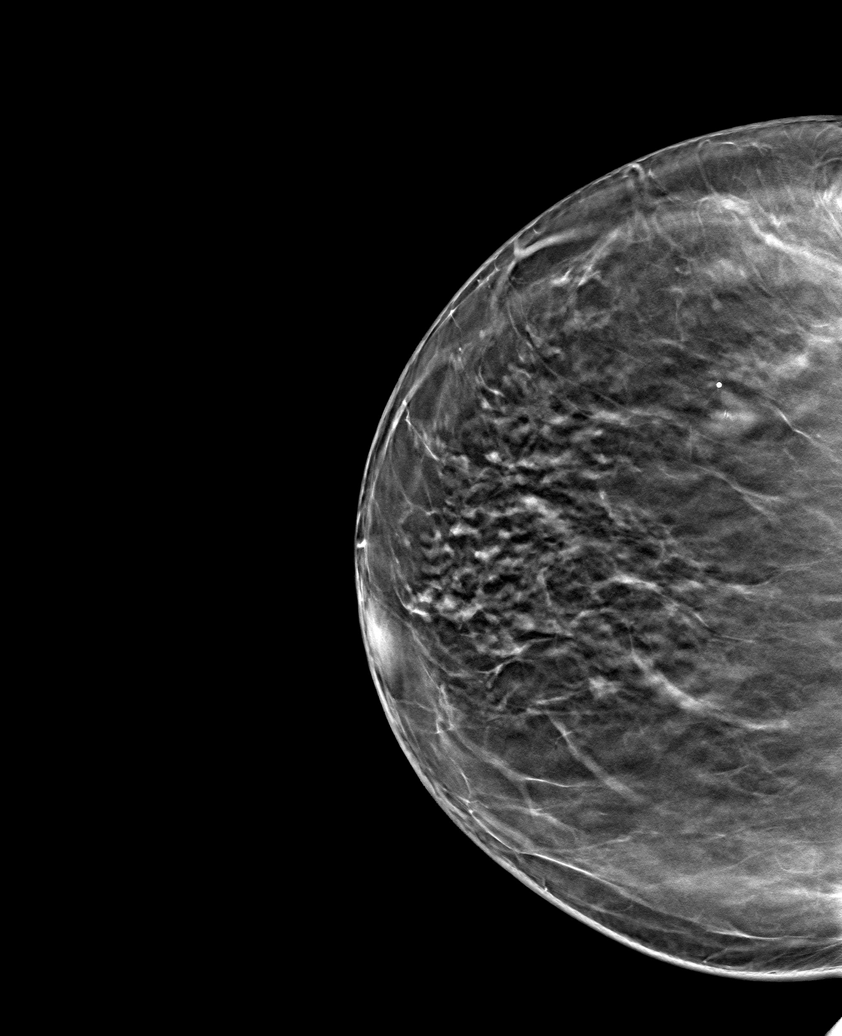

[6 of 30 positions shown; findings below may reference images not displayed]

ACR Breast Density Category b: There are scattered areas of
fibroglandular density.
FINDINGS: There are no findings suspicious for malignancy.
IMPRESSION: No mammographic evidence of malignancy. A result letter of this
screening mammogram will be mailed directly to the patient.

RECOMMENDATION:
Screening mammogram in one year. (Code:51-O-LD2)

BI-RADS CATEGORY  1: Negative.

## 2022-11-26 ENCOUNTER — Other Ambulatory Visit: Payer: Self-pay | Admitting: Internal Medicine

## 2022-11-26 DIAGNOSIS — Z1231 Encounter for screening mammogram for malignant neoplasm of breast: Secondary | ICD-10-CM

## 2022-12-02 ENCOUNTER — Other Ambulatory Visit: Payer: Self-pay | Admitting: Orthopedic Surgery

## 2022-12-02 DIAGNOSIS — M5106 Intervertebral disc disorders with myelopathy, lumbar region: Secondary | ICD-10-CM

## 2022-12-02 DIAGNOSIS — M4716 Other spondylosis with myelopathy, lumbar region: Secondary | ICD-10-CM

## 2022-12-03 ENCOUNTER — Ambulatory Visit: Admission: RE | Admit: 2022-12-03 | Payer: Medicare HMO | Source: Ambulatory Visit

## 2022-12-03 DIAGNOSIS — Z1231 Encounter for screening mammogram for malignant neoplasm of breast: Secondary | ICD-10-CM

## 2022-12-16 ENCOUNTER — Other Ambulatory Visit: Payer: Self-pay | Admitting: Internal Medicine

## 2022-12-19 ENCOUNTER — Encounter: Payer: Self-pay | Admitting: Orthopedic Surgery

## 2022-12-23 ENCOUNTER — Ambulatory Visit
Admission: RE | Admit: 2022-12-23 | Discharge: 2022-12-23 | Disposition: A | Discharge status: Home / Self Care | Payer: Medicare HMO | Source: Ambulatory Visit | Attending: Orthopedic Surgery | Admitting: Orthopedic Surgery

## 2022-12-23 DIAGNOSIS — M5106 Intervertebral disc disorders with myelopathy, lumbar region: Secondary | ICD-10-CM

## 2022-12-23 DIAGNOSIS — M4716 Other spondylosis with myelopathy, lumbar region: Secondary | ICD-10-CM

## 2023-02-13 LAB — LAB REPORT - SCANNED
Albumin/Creatinine Ratio, Urine, POC: <30
EGFR: 60

## 2023-04-02 ENCOUNTER — Ambulatory Visit (INDEPENDENT_AMBULATORY_CARE_PROVIDER_SITE_OTHER): Payer: Medicare HMO | Admitting: *Deleted

## 2023-04-02 DIAGNOSIS — Z0000 Encounter for general adult medical examination without abnormal findings: Secondary | ICD-10-CM | POA: Diagnosis not present

## 2023-04-02 NOTE — Progress Notes (Signed)
Subjective:   Amy [name] is a 72 y.o. female who presents for Medicare Annual (Subsequent) preventive examination.  Visit Complete: Virtual I connected with  Jessie Foot on 04/02/23 by a audio enabled telemedicine application and verified that I am speaking with the correct person using two identifiers.  Patient Location: Home  Provider Location: Home Office  I discussed the limitations of evaluation and management by telemedicine. The patient expressed understanding and agreed to proceed.  Vital Signs: Because this visit was a virtual/telehealth visit, some criteria may be missing or patient reported. Any vitals not documented were not able to be obtained and vitals that have been documented are patient reported.   Cardiac Risk Factors include: advanced age (>54men, >66 women);diabetes mellitus;obesity (BMI >30kg/m2)     Objective:    Today's Vitals   04/02/23 0935  PainSc: 7    There is no height or weight on file to calculate BMI.     04/02/2023    9:43 AM 07/05/2022    9:49 AM 04/16/2022    2:39 PM 02/20/2022    5:55 AM 10/04/2020    1:19 PM 07/12/2020    3:29 PM 10/19/2019    7:48 PM  Advanced Directives  Does Patient Have a Medical Advance Directive? No No No No No No No  Would patient like information on creating a medical advance directive? No - Patient declined No - Patient declined No - Patient declined   No - Patient declined     Current Medications (verified) Outpatient Encounter Medications as of 04/02/2023  Medication Sig   acetaminophen (TYLENOL) 500 MG tablet Take 1 tablet (500 mg total) by mouth every 6 (six) hours as needed.   aspirin EC 325 MG tablet Take 325 mg by mouth daily as needed for mild pain.   DOCUSATE SODIUM PO Take 2 capsules by mouth daily as needed (constipation).   fluticasone furoate-vilanterol (BREO ELLIPTA) 200-25 MCG/ACT AEPB TAKE 1 PUFF BY MOUTH EVERY DAY   ibuprofen (ADVIL) 600 MG tablet Take 1 tablet (600 mg total) by mouth  every 6 (six) hours as needed.   magnesium citrate SOLN Take 1 Bottle by mouth once as needed for severe constipation.   metFORMIN (GLUCOPHAGE) 1000 MG tablet Take 1 tablet (1,000 mg total) by mouth 2 (two) times daily with a meal.   Multiple Vitamin (MULTIVITAMIN) tablet Take 1 tablet by mouth daily.   polyethylene glycol powder (GLYCOLAX/MIRALAX) 17 GM/SCOOP powder Take 17 g by mouth daily as needed (constipation).   acetaminophen-codeine (TYLENOL #3) 300-30 MG tablet Take 1 tablet by mouth every 6 (six) hours as needed (pain).   Continuous Glucose Receiver (FREESTYLE LIBRE 3 READER) DEVI 1 Device by Does not apply route once for 1 dose. Use as directed to monitor patient blood glucose   Continuous Glucose Sensor (FREESTYLE LIBRE 3 SENSOR) MISC Apply 1 senor to skin every 14 days to monitor blood glucose. Use as directed   methocarbamol (ROBAXIN) 500 MG tablet Take 1 tablet (500 mg total) by mouth at bedtime. (Patient not taking: Reported on 04/02/2023)   ondansetron (ZOFRAN) 4 MG tablet Take 1 tablet (4 mg total) by mouth every 6 (six) hours. (Patient not taking: Reported on 04/02/2023)   No facility-administered encounter medications on file as of 04/02/2023.    Allergies (verified) Patient has no known allergies.   History: Past Medical History:  Diagnosis Date   Colon polyp, hyperplastic 03/29/2010   Diabetes mellitus    History of kidney stones  Mild intermittent asthma    Past Surgical History:  Procedure Laterality Date   CYST EXCISION Left 15 years ago   Axillary    ECTOPIC PREGNANCY SURGERY     OVARIAN CYST REMOVAL  35 years ago   TOTAL ABDOMINAL HYSTERECTOMY  1984   Family History  Problem Relation Age of Onset   Stomach cancer Maternal Grandmother    Heart disease Maternal Grandfather    Heart attack Maternal Grandfather    Diabetes Father    Hypertension Mother    Diabetes Brother    Hypertension Sister    Allergies Sister    Allergies Daughter    Social  History   Socioeconomic History   Marital status: Married    Spouse name: Not on file   Number of children: 2   Years of education: Not on file   Highest education level: Not on file  Occupational History    Employer: [employer]  Tobacco Use   Smoking status: Former    Current packs/day: 0.00    Average packs/day: 0.5 packs/day for 12.0 years (6.0 ttl pk-yrs)    Types: Cigarettes    Start date: 05/12/1968    Quit date: 05/12/1980    Years since quitting: 42.9   Smokeless tobacco: Never  Vaping Use   Vaping status: Never Used  Substance and Sexual Activity   Alcohol use: Yes    Alcohol/week: 0.0 standard drinks of alcohol    Comment: "Birthdays"   Drug use: No   Sexual activity: Yes    Partners: Male    Birth control/protection: Surgical    Comment: Hysterectomy  Other Topics Concern   Not on file  Social History Narrative   Not on file   Social Determinants of Health   Financial Resource Strain: Low Risk  (04/02/2023)   Overall Financial Resource Strain (CARDIA)    Difficulty of Paying Living Expenses: Not hard at all  Food Insecurity: No Food Insecurity (04/02/2023)   Hunger Vital Sign    Worried About Running Out of Food in the Last Year: Never true    Ran Out of Food in the Last Year: Never true  Transportation Needs: No Transportation Needs (04/02/2023)   PRAPARE - Administrator, Civil Service (Medical): No    Lack of Transportation (Non-Medical): No  Physical Activity: Inactive (04/02/2023)   Exercise Vital Sign    Days of Exercise per Week: 0 days    Minutes of Exercise per Session: 0 min  Stress: No Stress Concern Present (04/02/2023)   [employer] of Occupational Health - Occupational Stress Questionnaire    Feeling of Stress : Not at all  Social Connections: Moderately Isolated (04/02/2023)   Social Connection and Isolation Panel [NHANES]    Frequency of Communication with Friends and Family: Twice a week    Frequency of  Social Gatherings with Friends and Family: Twice a week    Attends Religious Services: Never    Database administrator or Organizations: No    Attends Engineer, structural: Never    Marital Status: Married    Tobacco Counseling Counseling given: Not Answered   Clinical Intake:  Pre-visit preparation completed: Yes  Pain : 0-10 Pain Score: 7  Pain Type: Chronic pain Pain Location: Back Pain Descriptors / Indicators: Burning, Aching, Sore Pain Relieving Factors: aleve  Pain Relieving Factors: aleve  Diabetes: Yes CBG done?: No Did pt. bring in CBG monitor from home?: No  How often do you need  to have someone help you when you read instructions, pamphlets, or other written materials from your doctor or pharmacy?: 1 - Never  Interpreter Needed?: No  Information entered by :: Remi Haggard LPN   Activities of Daily Living    04/02/2023    9:44 AM 04/16/2022    2:40 PM  In your present state of health, do you have any difficulty performing the following activities:  Hearing? 0 1  Vision? 0 1  Difficulty concentrating or making decisions? 0 1  Walking or climbing stairs? 0 1  Dressing or bathing? 0 1  Doing errands, shopping? 0 1  Preparing Food and eating ? N Y  Using the Toilet? N Y  In the past six months, have you accidently leaked urine? N Y  Do you have problems with loss of bowel control? N Y  Managing your Medications? N Y  Managing your Finances? N Y  Housekeeping or managing your Housekeeping? N Y    Patient Care Team: Georgina Quint, MD as PCP - General (Internal Medicine)  Indicate any recent Medical Services you may have received from other than [facility] providers in the past year (date may be approximate).     Assessment:   This is a routine wellness examination for Serenidy.  Hearing/Vision screen Hearing Screening - Comments:: No trouble hearing Vision Screening - Comments:: Not up to date Unsure of name   Goals Addressed              This Visit's Progress    Increase physical activity         Depression Screen    04/02/2023    9:42 AM 09/16/2022   10:04 AM 07/03/2022    3:42 PM 06/17/2022   10:51 AM 03/12/2022    8:26 AM 09/09/2021   10:30 AM 06/10/2021   11:00 AM  PHQ 2/9 Scores  PHQ - 2 Score 1 0 0 0 0 0 0  PHQ- 9 Score 1     0     Fall Risk    04/02/2023    9:37 AM 09/16/2022   10:04 AM 06/17/2022   10:51 AM 04/16/2022    2:33 PM 03/12/2022    8:25 AM  Fall Risk   Falls in the past year? 0 0 0 0 0  Number falls in past yr: 0 0 0 0 0  Injury with Fall? 0 0 0 0 0  Risk for fall due to :  No Fall Risks No Fall Risks No Fall Risks No Fall Risks  Follow up Falls evaluation completed;Education provided;Falls prevention discussed Falls evaluation completed Falls evaluation completed Falls prevention discussed Falls evaluation completed    MEDICARE RISK AT HOME: Medicare Risk at Home Any stairs in or around the home?: No If so, are there any without handrails?: No Home free of loose throw rugs in walkways, pet beds, electrical cords, etc?: Yes Adequate lighting in your home to reduce risk of falls?: Yes Life alert?: No Use of a cane, walker or w/c?: No Grab bars in the bathroom?: No Shower chair or bench in shower?: No Elevated toilet seat or a handicapped toilet?: Yes  TIMED UP AND GO:  Was the test performed?  No    Cognitive Function:        04/02/2023    9:44 AM 04/16/2022    2:41 PM  6CIT Screen  What Year? 0 points 0 points  What month? 0 points 0 points  What time? 0 points 0 points  Count back from 20 4 points 0 points  Months in reverse 4 points 0 points  Repeat phrase 8 points 0 points  Total Score 16 points 0 points    Immunizations Immunization History  Administered Date(s) Administered   DTaP 06/06/2014   Fluad Quad(high Dose 65+) 05/17/2018, 04/26/2020, 03/01/2021, 03/18/2022   Influenza Split 05/17/2012, 04/19/2014   Influenza, Seasonal, Injecte, Preservative Fre  04/19/2014, 05/30/2015   Influenza,inj,quad, With Preservative 04/19/2014, 03/12/2016, 04/09/2017   PFIZER(Purple Top)SARS-COV-2 Vaccination 06/18/2019, 07/11/2019, 03/21/2020   PNEUMOCOCCAL CONJUGATE-20 04/30/2021   Pfizer Covid-19 Vaccine Bivalent Booster 8yrs & up 03/04/2021   Pneumococcal Conjugate-13 05/27/2018   Pneumococcal Polysaccharide-23 06/06/2014   Tdap 06/06/2014    TDAP status: Up to date  Flu Vaccine status: Due, Education has been provided regarding the importance of this vaccine. Advised may receive this vaccine at local pharmacy or Health Dept. Aware to provide a copy of the vaccination record if obtained from local pharmacy or Health Dept. Verbalized acceptance and understanding.  Pneumococcal vaccine status: Up to date  Covid-19 vaccine status: Information provided on how to obtain vaccines.   Qualifies for Shingles Vaccine? Yes   Zostavax completed No   Shingrix Completed?: No.    Education has been provided regarding the importance of this vaccine. Patient has been advised to call insurance company to determine out of pocket expense if they have not yet received this vaccine. Advised may also receive vaccine at local pharmacy or Health Dept. Verbalized acceptance and understanding.  Screening Tests Health Maintenance  Topic Date Due   FOOT EXAM  Never done   OPHTHALMOLOGY EXAM  04/09/2022   Diabetic kidney evaluation - Urine ACR  06/10/2022   HEMOGLOBIN A1C  03/19/2023   COVID-19 Vaccine (5 - 2023-24 season) 04/18/2023 (Originally 01/11/2023)   Zoster Vaccines- Shingrix (1 of 2) 07/03/2023 (Originally 09/20/1969)   INFLUENZA VACCINE  08/10/2023 (Originally 12/11/2022)   Hepatitis C Screening  04/01/2024 (Originally 09/20/1968)   Diabetic kidney evaluation - eGFR measurement  09/16/2023   Medicare Annual Wellness (AWV)  04/01/2024   DTaP/Tdap/Td (3 - Td or Tdap) 06/06/2024   MAMMOGRAM  12/02/2024   Colonoscopy  04/11/2031   Pneumonia Vaccine 72+ Years old   Completed   DEXA SCAN  Completed   HPV VACCINES  Aged Out    Health Maintenance  Health Maintenance Due  Topic Date Due   FOOT EXAM  Never done   OPHTHALMOLOGY EXAM  04/09/2022   Diabetic kidney evaluation - Urine ACR  06/10/2022   HEMOGLOBIN A1C  03/19/2023    Colorectal cancer screening: Type of screening: Colonoscopy. Completed 2022. Repeat every 10 years  Mammogram status: Completed  . Repeat every year  Bone Density status: Completed  . Results reflect: Bone density results: NORMAL. Repeat every 2019 years.  Lung Cancer Screening: (Low Dose CT Chest recommended if Age 24-80 years, 20 pack-year currently smoking OR have quit w/in 15years.) does not qualify.   Lung Cancer Screening Referral:   Additional Screening:  Hepatitis C Screening:  never done  Vision Screening: Recommended annual ophthalmology exams for early detection of glaucoma and other disorders of the eye. Is the patient up to date with their annual eye exam?  Yes  Who is the provider or what is the name of the office in which the patient attends annual eye exams? Changing unsure of name If pt is not established with a provider, would they like to be referred to a provider to establish care? No .   Dental  Screening: Recommended annual dental exams for proper oral hygiene  Nutrition Risk Assessment:  Has the patient had any N/V/D within the last 2 months?  No  Does the patient have any non-healing wounds?  No  Has the patient had any unintentional weight loss or weight gain?  No   Diabetes:  Is the patient diabetic?  Yes  If diabetic, was a CBG obtained today?  No  Did the patient bring in their glucometer from home?  No  How often do you monitor your CBG's?  1x a week.   Financial Strains and Diabetes Management:  Are you having any financial strains with the device, your supplies or your medication? No .  Does the patient want to be seen by Chronic Care Management for management of their diabetes?   No  Would the patient like to be referred to a Nutritionist or for Diabetic Management?  No   Diabetic Exams:  Diabetic Eye Exam: .  Pt has been advised about the importance in completing this exam.   Diabetic Foot Exam: . Pt has been advised about the importance in completing this exam.    Community Resource Referral / Chronic Care Management: CRR required this visit?  No   CCM required this visit?  No     Plan:     I have personally reviewed and noted the following in the patient's chart:   Medical and social history Use of alcohol, tobacco or illicit drugs  Current medications and supplements including opioid prescriptions. Patient is not currently taking opioid prescriptions. Functional ability and status Nutritional status Physical activity Advanced directives List of other physicians Hospitalizations, surgeries, and ER visits in previous 12 months Vitals Screenings to include cognitive, depression, and falls Referrals and appointments  In addition, I have reviewed and discussed with patient certain preventive protocols, quality metrics, and best practice recommendations. A written personalized care plan for preventive services as well as general preventive health recommendations were provided to patient.     Remi Haggard, LPN   [id number]/02/[id number]   After Visit Summary: (MyChart) Due to this being a telephonic visit, the after visit summary with patients personalized plan was offered to patient via MyChart   Nurse Notes:

## 2023-04-02 NOTE — Patient Instructions (Signed)
Amy [name] , Thank you for taking time to come for your Medicare Wellness Visit. I appreciate your ongoing commitment to your health goals. Please review the following plan we discussed and let me know if I can assist you in the future.   Screening recommendations/referrals: Colonoscopy: up to date Mammogram: up to date Bone Density: up to date Recommended yearly ophthalmology/optometry visit for glaucoma screening and checkup Recommended yearly dental visit for hygiene and checkup  Vaccinations: Influenza vaccine: Education provided Pneumococcal vaccine: up to date Tdap vaccine: up to date Shingles vaccine: Education provided    Advanced directives: Education provided     Preventive Care 70 Years and Older, Female Preventive care refers to lifestyle choices and visits with your health care provider that can promote health and wellness. What does preventive care include? A yearly physical exam. This is also called an annual well check. Dental exams once or twice a year. Routine eye exams. Ask your health care provider how often you should have your eyes checked. Personal lifestyle choices, including: Daily care of your teeth and gums. Regular physical activity. Eating a healthy diet. Avoiding tobacco and drug use. Limiting alcohol use. Practicing safe sex. Taking low-dose aspirin every day. Taking vitamin and mineral supplements as recommended by your health care provider. What happens during an annual well check? The services and screenings done by your health care provider during your annual well check will depend on your age, overall health, lifestyle risk factors, and family history of disease. Counseling  Your health care provider may ask you questions about your: Alcohol use. Tobacco use. Drug use. Emotional well-being. Home and relationship well-being. Sexual activity. Eating habits. History of falls. Memory and ability to understand (cognition). Work and work  Astronomer. Reproductive health. Screening  You may have the following tests or measurements: Height, weight, and BMI. Blood pressure. Lipid and cholesterol levels. These may be checked every 5 years, or more frequently if you are over 1 years old. Skin check. Lung cancer screening. You may have this screening every year starting at age 70 if you have a 30-pack-year history of smoking and currently smoke or have quit within the past 15 years. Fecal occult blood test (FOBT) of the stool. You may have this test every year starting at age 70. Flexible sigmoidoscopy or colonoscopy. You may have a sigmoidoscopy every 5 years or a colonoscopy every 10 years starting at age 70. Hepatitis C blood test. Hepatitis B blood test. Sexually transmitted disease (STD) testing. Diabetes screening. This is done by checking your blood sugar (glucose) after you have not eaten for a while (fasting). You may have this done every 1-3 years. Bone density scan. This is done to screen for osteoporosis. You may have this done starting at age 70. Mammogram. This may be done every 1-2 years. Talk to your health care provider about how often you should have regular mammograms. Talk with your health care provider about your test results, treatment options, and if necessary, the need for more tests. Vaccines  Your health care provider may recommend certain vaccines, such as: Influenza vaccine. This is recommended every year. Tetanus, diphtheria, and acellular pertussis (Tdap, Td) vaccine. You may need a Td booster every 10 years. Zoster vaccine. You may need this after age 70. Pneumococcal 13-valent conjugate (PCV13) vaccine. One dose is recommended after age 70. Pneumococcal polysaccharide (PPSV23) vaccine. One dose is recommended after age 70. Talk to your health care provider about which screenings and vaccines you need and how often  you need them. This information is not intended to replace advice given to you by  your health care provider. Make sure you discuss any questions you have with your health care provider. Document Released: 05/25/2015 Document Revised: 01/16/2016 Document Reviewed: 02/27/2015 Elsevier Interactive Patient Education  2017 [employer].  Fall Prevention in the Home Falls can cause injuries. They can happen to people of all ages. There are many things you can do to make your home safe and to help prevent falls. What can I do on the outside of my home? Regularly fix the edges of walkways and driveways and fix any cracks. Remove anything that might make you trip as you walk through a door, such as a raised step or threshold. Trim any bushes or trees on the path to your home. Use bright outdoor lighting. Clear any walking paths of anything that might make someone trip, such as rocks or tools. Regularly check to see if handrails are loose or broken. Make sure that both sides of any steps have handrails. Any raised decks and porches should have guardrails on the edges. Have any leaves, snow, or ice cleared regularly. Use sand or salt on walking paths during winter. Clean up any spills in your garage right away. This includes oil or grease spills. What can I do in the bathroom? Use night lights. Install grab bars by the toilet and in the tub and shower. Do not use towel bars as grab bars. Use non-skid mats or decals in the tub or shower. If you need to sit down in the shower, use a plastic, non-slip stool. Keep the floor dry. Clean up any water that spills on the floor as soon as it happens. Remove soap buildup in the tub or shower regularly. Attach bath mats securely with double-sided non-slip rug tape. Do not have throw rugs and other things on the floor that can make you trip. What can I do in the bedroom? Use night lights. Make sure that you have a light by your bed that is easy to reach. Do not use any sheets or blankets that are too big for your bed. They should not hang  down onto the floor. Have a firm chair that has side arms. You can use this for support while you get dressed. Do not have throw rugs and other things on the floor that can make you trip. What can I do in the kitchen? Clean up any spills right away. Avoid walking on wet floors. Keep items that you use a lot in easy-to-reach places. If you need to reach something above you, use a strong step stool that has a grab bar. Keep electrical cords out of the way. Do not use floor polish or wax that makes floors slippery. If you must use wax, use non-skid floor wax. Do not have throw rugs and other things on the floor that can make you trip. What can I do with my stairs? Do not leave any items on the stairs. Make sure that there are handrails on both sides of the stairs and use them. Fix handrails that are broken or loose. Make sure that handrails are as long as the stairways. Check any carpeting to make sure that it is firmly attached to the stairs. Fix any carpet that is loose or worn. Avoid having throw rugs at the top or bottom of the stairs. If you do have throw rugs, attach them to the floor with carpet tape. Make sure that you have a light  switch at the top of the stairs and the bottom of the stairs. If you do not have them, ask someone to add them for you. What else can I do to help prevent falls? Wear shoes that: Do not have high heels. Have rubber bottoms. Are comfortable and fit you well. Are closed at the toe. Do not wear sandals. If you use a stepladder: Make sure that it is fully opened. Do not climb a closed stepladder. Make sure that both sides of the stepladder are locked into place. Ask someone to hold it for you, if possible. Clearly mark and make sure that you can see: Any grab bars or handrails. First and last steps. Where the edge of each step is. Use tools that help you move around (mobility aids) if they are needed. These  include: Canes. Walkers. Scooters. Crutches. Turn on the lights when you go into a dark area. Replace any light bulbs as soon as they burn out. Set up your furniture so you have a clear path. Avoid moving your furniture around. If any of your floors are uneven, fix them. If there are any pets around you, be aware of where they are. Review your medicines with your doctor. Some medicines can make you feel dizzy. This can increase your chance of falling. Ask your doctor what other things that you can do to help prevent falls. This information is not intended to replace advice given to you by your health care provider. Make sure you discuss any questions you have with your health care provider. Document Released: 02/22/2009 Document Revised: 10/04/2015 Document Reviewed: 06/02/2014 Elsevier Interactive Patient Education  2017 [employer].

## 2023-04-07 ENCOUNTER — Encounter: Payer: Self-pay | Admitting: Emergency Medicine

## 2023-04-07 ENCOUNTER — Ambulatory Visit (INDEPENDENT_AMBULATORY_CARE_PROVIDER_SITE_OTHER): Payer: Medicare HMO | Admitting: Emergency Medicine

## 2023-04-07 VITALS — BP 130/80 | HR 75 | Temp 98.5°F | Ht 66.0 in | Wt 231.0 lb

## 2023-04-07 DIAGNOSIS — Z7984 Long term (current) use of oral hypoglycemic drugs: Secondary | ICD-10-CM | POA: Diagnosis not present

## 2023-04-07 DIAGNOSIS — E1165 Type 2 diabetes mellitus with hyperglycemia: Secondary | ICD-10-CM | POA: Diagnosis not present

## 2023-04-07 LAB — POCT GLYCOSYLATED HEMOGLOBIN (HGB A1C): Hemoglobin A1C: 6.9 % — AB (ref 4.0–5.6)

## 2023-04-07 NOTE — Patient Instructions (Signed)

## 2023-04-07 NOTE — Progress Notes (Signed)
Amy [name] 72 y.o.   Chief Complaint  Patient presents with   Medical Management of Chronic Issues    6 month f/u for DM . Patient states she is seeing someone for her back pain.    HISTORY OF PRESENT ILLNESS: This is a 72 y.o. female here for 71-month follow-up of chronic medical conditions including diabetes Overall doing well.  Has no complaints or medical concerns today. Wt Readings from Last 3 Encounters:  04/07/23 231 lb (104.8 kg)  09/16/22 230 lb 2 oz (104.4 kg)  07/31/22 231 lb 6.4 oz (105 kg)     HPI   Prior to Admission medications   Medication Sig Start Date End Date Taking? Authorizing Provider  acetaminophen (TYLENOL) 500 MG tablet Take 1 tablet (500 mg total) by mouth every 6 (six) hours as needed. 07/05/22  Yes Redwine, Madison A, PA-C  acetaminophen-codeine (TYLENOL #3) 300-30 MG tablet Take 1 tablet by mouth every 6 (six) hours as needed (pain).   Yes [provider]  aspirin EC 325 MG tablet Take 325 mg by mouth daily as needed for mild pain.   Yes [provider]  Continuous Glucose Sensor (FREESTYLE LIBRE 3 SENSOR) MISC Apply 1 senor to skin every 14 days to monitor blood glucose. Use as directed 09/24/22  Yes Reta Norgren, Eilleen Kempf, MD  DOCUSATE SODIUM PO Take 2 capsules by mouth daily as needed (constipation).   Yes [provider]  fluticasone furoate-vilanterol (BREO ELLIPTA) 200-25 MCG/ACT AEPB TAKE 1 PUFF BY MOUTH EVERY DAY 12/16/22  Yes Volanda Mangine, Eilleen Kempf, MD  ibuprofen (ADVIL) 600 MG tablet Take 1 tablet (600 mg total) by mouth every 6 (six) hours as needed. 07/05/22  Yes Redwine, Madison A, PA-C  magnesium citrate SOLN Take 1 Bottle by mouth once as needed for severe constipation.   Yes [provider]  metFORMIN (GLUCOPHAGE) 1000 MG tablet Take 1 tablet (1,000 mg total) by mouth 2 (two) times daily with a meal. 08/14/22  Yes Mimie Goering, Eilleen Kempf, MD  Multiple Vitamin (MULTIVITAMIN) tablet Take 1 tablet by mouth  daily.   Yes [provider]  polyethylene glycol powder (GLYCOLAX/MIRALAX) 17 GM/SCOOP powder Take 17 g by mouth daily as needed (constipation).   Yes [provider]  Continuous Glucose Receiver (FREESTYLE LIBRE 3 READER) DEVI 1 Device by Does not apply route once for 1 dose. Use as directed to monitor patient blood glucose 09/24/22 09/24/22  Arika Mainer, Eilleen Kempf, MD  methocarbamol (ROBAXIN) 500 MG tablet Take 1 tablet (500 mg total) by mouth at bedtime. Patient not taking: Reported on 04/02/2023 07/05/22   Redwine, Madison A, PA-C  ondansetron (ZOFRAN) 4 MG tablet Take 1 tablet (4 mg total) by mouth every 6 (six) hours. Patient not taking: Reported on 04/02/2023 07/05/22   Redwine, Madison A, PA-C    No Known Allergies  Patient Active Problem List   Diagnosis Date Noted   Hemorrhoids 12/18/2020   Abnormal EKG 04/27/2019   Mild intermittent asthma 10/26/2017   Morbid (severe) obesity due to excess calories (HCC) 09/29/2016   Diabetes (HCC) 08/10/2013   History of kidney stones    Colon polyp, hyperplastic 03/29/2010    Past Medical History:  Diagnosis Date   Colon polyp, hyperplastic 03/29/2010   Diabetes mellitus    History of kidney stones    Mild intermittent asthma     Past Surgical History:  Procedure Laterality Date   CYST EXCISION Left 15 years ago   Axillary    ECTOPIC  PREGNANCY SURGERY     OVARIAN CYST REMOVAL  35 years ago   TOTAL ABDOMINAL HYSTERECTOMY  1984    Social History   Socioeconomic History   Marital status: Married    Spouse name: Not on file   Number of children: 2   Years of education: Not on file   Highest education level: Not on file  Occupational History    Employer: [employer]  Tobacco Use   Smoking status: Former    Current packs/day: 0.00    Average packs/day: 0.5 packs/day for 12.0 years (6.0 ttl pk-yrs)    Types: Cigarettes    Start date: 05/12/1968    Quit date: 05/12/1980    Years since quitting: 42.9    Smokeless tobacco: Never  Vaping Use   Vaping status: Never Used  Substance and Sexual Activity   Alcohol use: Yes    Alcohol/week: 0.0 standard drinks of alcohol    Comment: "Birthdays"   Drug use: No   Sexual activity: Yes    Partners: Male    Birth control/protection: Surgical    Comment: Hysterectomy  Other Topics Concern   Not on file  Social History Narrative   Not on file   Social Determinants of Health   Financial Resource Strain: Low Risk  (04/02/2023)   Overall Financial Resource Strain (CARDIA)    Difficulty of Paying Living Expenses: Not hard at all  Food Insecurity: No Food Insecurity (04/02/2023)   Hunger Vital Sign    Worried About Running Out of Food in the Last Year: Never true    Ran Out of Food in the Last Year: Never true  Transportation Needs: No Transportation Needs (04/02/2023)   PRAPARE - Administrator, Civil Service (Medical): No    Lack of Transportation (Non-Medical): No  Physical Activity: Inactive (04/02/2023)   Exercise Vital Sign    Days of Exercise per Week: 0 days    Minutes of Exercise per Session: 0 min  Stress: No Stress Concern Present (04/02/2023)   [employer] of Occupational Health - Occupational Stress Questionnaire    Feeling of Stress : Not at all  Social Connections: Moderately Isolated (04/02/2023)   Social Connection and Isolation Panel [NHANES]    Frequency of Communication with Friends and Family: Twice a week    Frequency of Social Gatherings with Friends and Family: Twice a week    Attends Religious Services: Never    Database administrator or Organizations: No    Attends Banker Meetings: Never    Marital Status: Married  Catering manager Violence: Not At Risk (04/02/2023)   Humiliation, Afraid, Rape, and Kick questionnaire    Fear of Current or Ex-Partner: No    Emotionally Abused: No    Physically Abused: No    Sexually Abused: No    Family History  Problem Relation Age of  Onset   Stomach cancer Maternal Grandmother    Heart disease Maternal Grandfather    Heart attack Maternal Grandfather    Diabetes Father    Hypertension Mother    Diabetes Brother    Hypertension Sister    Allergies Sister    Allergies Daughter      Review of Systems  Constitutional: Negative.  Negative for chills and fever.  HENT: Negative.  Negative for congestion and sore throat.   Respiratory: Negative.  Negative for cough and shortness of breath.   Cardiovascular: Negative.  Negative for chest pain and palpitations.  Gastrointestinal:  Negative  for abdominal pain, nausea and vomiting.  Genitourinary: Negative.  Negative for dysuria and hematuria.  Musculoskeletal:  Positive for back pain.  Skin: Negative.  Negative for rash.  Neurological: Negative.  Negative for dizziness and headaches.  All other systems reviewed and are negative.   Vitals:   04/07/23 1026  BP: 130/80  Pulse: 75  Temp: 98.5 F (36.9 C)  SpO2: 95%    Physical Exam Vitals reviewed.  Constitutional:      Appearance: Normal appearance.  HENT:     Head: Normocephalic.  Eyes:     Extraocular Movements: Extraocular movements intact.  Cardiovascular:     Rate and Rhythm: Normal rate and regular rhythm.     Pulses: Normal pulses.     Heart sounds: Normal heart sounds.  Pulmonary:     Effort: Pulmonary effort is normal.     Breath sounds: Normal breath sounds.  Skin:    General: Skin is warm and dry.     Capillary Refill: Capillary refill takes less than 2 seconds.  Neurological:     General: No focal deficit present.     Mental Status: She is alert and oriented to person, place, and time.  Psychiatric:        Mood and Affect: Mood normal.        Behavior: Behavior normal.    Results for orders placed or performed in visit on 04/07/23 (from the past 24 hour(s))  POCT HgB A1C     Status: Abnormal   Collection Time: 04/07/23 10:45 AM  Result Value Ref Range   Hemoglobin A1C 6.9 (A) 4.0 -  5.6 %   HbA1c POC (<> result, manual entry)     HbA1c, POC (prediabetic range)     HbA1c, POC (controlled diabetic range)       ASSESSMENT & PLAN: A total of 42 minutes was spent with the patient and counseling/coordination of care regarding preparing for this visit, review of most recent office visit notes, review of multiple chronic medical conditions and their management, cardiovascular risks associated with diabetes, review of all medications, review of most recent bloodwork results including interpretation of today's hemoglobin A1c, review of health maintenance items, education on nutrition, prognosis, documentation, and need for follow up.   Problem List Items Addressed This Visit       Endocrine   Diabetes (HCC) - Primary    Well-controlled diabetes with hemoglobin A1c of 6.9 Continue metformin 1000 mg twice a day Diet and nutrition discussed Cardiovascular risk associated with diabetes discussed Follow-up in 6 months       Relevant Orders   POCT HgB A1C (Completed)     Other   Morbid (severe) obesity due to excess calories (HCC)    Diet and nutrition discussed Benefits of exercise discussed Advised to decrease amount of daily carbohydrate intake and daily calories and increase amount of plant-based protein in her diet.      Patient Instructions  Diabetes Mellitus and Nutrition, Adult When you have diabetes, or diabetes mellitus, it is very important to have healthy eating habits because your blood sugar (glucose) levels are greatly affected by what you eat and drink. Eating healthy foods in the right amounts, at about the same times every day, can help you: Manage your blood glucose. Lower your risk of heart disease. Improve your blood pressure. Reach or maintain a healthy weight. What can affect my meal plan? Every person with diabetes is different, and each person has different needs for a meal plan.  Your health care provider may recommend that you work with a  dietitian to make a meal plan that is best for you. Your meal plan may vary depending on factors such as: The calories you need. The medicines you take. Your weight. Your blood glucose, blood pressure, and cholesterol levels. Your activity level. Other health conditions you have, such as heart or kidney disease. How do carbohydrates affect me? Carbohydrates, also called carbs, affect your blood glucose level more than any other type of food. Eating carbs raises the amount of glucose in your blood. It is important to know how many carbs you can safely have in each meal. This is different for every person. Your dietitian can help you calculate how many carbs you should have at each meal and for each snack. How does alcohol affect me? Alcohol can cause a decrease in blood glucose (hypoglycemia), especially if you use insulin or take certain diabetes medicines by mouth. Hypoglycemia can be a life-threatening condition. Symptoms of hypoglycemia, such as sleepiness, dizziness, and confusion, are similar to symptoms of having too much alcohol. Do not drink alcohol if: Your health care provider tells you not to drink. You are pregnant, may be pregnant, or are planning to become pregnant. If you drink alcohol: Limit how much you have to: 0-1 drink a day for women. 0-2 drinks a day for men. Know how much alcohol is in your drink. In the [place] one drink equals one 12 oz bottle of beer (355 mL), one 5 oz glass of wine (148 mL), or one 1 oz glass of hard liquor (44 mL). Keep yourself hydrated with water, diet soda, or unsweetened iced tea. Keep in mind that regular soda, juice, and other mixers may contain a lot of sugar and must be counted as carbs. What are tips for following this plan?  Reading food labels Start by checking the serving size on the Nutrition Facts label of packaged foods and drinks. The number of calories and the amount of carbs, fats, and other nutrients listed on the label are based  on one serving of the item. Many items contain more than one serving per package. Check the total grams (g) of carbs in one serving. Check the number of grams of saturated fats and trans fats in one serving. Choose foods that have a low amount or none of these fats. Check the number of milligrams (mg) of salt (sodium) in one serving. Most people should limit total sodium intake to less than 2,300 mg per day. Always check the nutrition information of foods labeled as "low-fat" or "nonfat." These foods may be higher in added sugar or refined carbs and should be avoided. Talk to your dietitian to identify your daily goals for nutrients listed on the label. Shopping Avoid buying canned, pre-made, or processed foods. These foods tend to be high in fat, sodium, and added sugar. Shop around the outside edge of the grocery store. This is where you will most often find fresh fruits and vegetables, bulk grains, fresh meats, and fresh dairy products. Cooking Use low-heat cooking methods, such as baking, instead of high-heat cooking methods, such as deep frying. Cook using healthy oils, such as olive, canola, or sunflower oil. Avoid cooking with butter, cream, or high-fat meats. Meal planning Eat meals and snacks regularly, preferably at the same times every day. Avoid going long periods of time without eating. Eat foods that are high in fiber, such as fresh fruits, vegetables, beans, and whole grains. Eat 4-6 oz (112-168  g) of lean protein each day, such as lean meat, chicken, fish, eggs, or tofu. One ounce (oz) (28 g) of lean protein is equal to: 1 oz (28 g) of meat, chicken, or fish. 1 egg.  cup (62 g) of tofu. Eat some foods each day that contain healthy fats, such as avocado, nuts, seeds, and fish. What foods should I eat? Fruits Berries. Apples. Oranges. Peaches. Apricots. Plums. Grapes. Mangoes. Papayas. Pomegranates. Kiwi. Cherries. Vegetables Leafy greens, including lettuce, spinach, kale,  chard, collard greens, mustard greens, and cabbage. Beets. Cauliflower. Broccoli. Carrots. Green beans. Tomatoes. Peppers. Onions. Cucumbers. Brussels sprouts. Grains Whole grains, such as whole-wheat or whole-grain bread, crackers, tortillas, cereal, and pasta. Unsweetened oatmeal. Quinoa. Brown or wild rice. Meats and other proteins Seafood. Poultry without skin. Lean cuts of poultry and beef. Tofu. Nuts. Seeds. Dairy Low-fat or fat-free dairy products such as milk, yogurt, and cheese. The items listed above may not be a complete list of foods and beverages you can eat and drink. Contact a dietitian for more information. What foods should I avoid? Fruits Fruits canned with syrup. Vegetables Canned vegetables. Frozen vegetables with butter or cream sauce. Grains Refined white flour and flour products such as bread, pasta, snack foods, and cereals. Avoid all processed foods. Meats and other proteins Fatty cuts of meat. Poultry with skin. Breaded or fried meats. Processed meat. Avoid saturated fats. Dairy Full-fat yogurt, cheese, or milk. Beverages Sweetened drinks, such as soda or iced tea. The items listed above may not be a complete list of foods and beverages you should avoid. Contact a dietitian for more information. Questions to ask a health care provider Do I need to meet with a certified diabetes care and education specialist? Do I need to meet with a dietitian? What number can I call if I have questions? When are the best times to check my blood glucose? Where to find more information: American Diabetes Association: diabetes.org Academy of Nutrition and Dietetics: eatright.[employer] of Diabetes and Digestive and Kidney Diseases: StageSync.si Association of Diabetes Care & Education Specialists: diabeteseducator.org Summary It is important to have healthy eating habits because your blood sugar (glucose) levels are greatly affected by what you eat and drink. It is  important to use alcohol carefully. A healthy meal plan will help you manage your blood glucose and lower your risk of heart disease. Your health care provider may recommend that you work with a dietitian to make a meal plan that is best for you. This information is not intended to replace advice given to you by your health care provider. Make sure you discuss any questions you have with your health care provider. Document Revised: 11/30/2019 Document Reviewed: 11/30/2019 Elsevier Patient Education  2024 Elsevier Inc.      Edwina Barth, MD [facility]

## 2023-04-07 NOTE — Assessment & Plan Note (Signed)
Diet and nutrition discussed Benefits of exercise discussed Advised to decrease amount of daily carbohydrate intake and daily calories and increase amount of plant based protein in her diet.

## 2023-04-07 NOTE — Assessment & Plan Note (Signed)
Well-controlled diabetes with hemoglobin A1c of 6.9 Continue metformin 1000 mg twice a day Diet and nutrition discussed Cardiovascular risk associated with diabetes discussed Follow-up in 6 months

## 2023-04-15 ENCOUNTER — Ambulatory Visit: Payer: Medicare HMO

## 2023-04-15 DIAGNOSIS — Z23 Encounter for immunization: Secondary | ICD-10-CM | POA: Diagnosis not present

## 2023-04-15 NOTE — Progress Notes (Signed)
Pt was given Flu vaccine with no complications.

## 2023-05-25 DIAGNOSIS — M1611 Unilateral primary osteoarthritis, right hip: Secondary | ICD-10-CM | POA: Diagnosis not present

## 2023-06-04 DIAGNOSIS — M1611 Unilateral primary osteoarthritis, right hip: Secondary | ICD-10-CM | POA: Diagnosis not present

## 2023-06-04 DIAGNOSIS — M5416 Radiculopathy, lumbar region: Secondary | ICD-10-CM | POA: Diagnosis not present

## 2023-06-04 DIAGNOSIS — M4716 Other spondylosis with myelopathy, lumbar region: Secondary | ICD-10-CM | POA: Diagnosis not present

## 2023-06-09 DIAGNOSIS — M5416 Radiculopathy, lumbar region: Secondary | ICD-10-CM | POA: Diagnosis not present

## 2023-06-22 DIAGNOSIS — I70203 Unspecified atherosclerosis of native arteries of extremities, bilateral legs: Secondary | ICD-10-CM | POA: Diagnosis not present

## 2023-06-22 DIAGNOSIS — M2011 Hallux valgus (acquired), right foot: Secondary | ICD-10-CM | POA: Diagnosis not present

## 2023-07-05 ENCOUNTER — Other Ambulatory Visit: Payer: Self-pay

## 2023-07-05 ENCOUNTER — Emergency Department (HOSPITAL_BASED_OUTPATIENT_CLINIC_OR_DEPARTMENT_OTHER): Payer: Medicare Other | Admitting: Radiology

## 2023-07-05 ENCOUNTER — Encounter (HOSPITAL_BASED_OUTPATIENT_CLINIC_OR_DEPARTMENT_OTHER): Payer: Self-pay | Admitting: Emergency Medicine

## 2023-07-05 ENCOUNTER — Emergency Department (HOSPITAL_BASED_OUTPATIENT_CLINIC_OR_DEPARTMENT_OTHER)
Admission: EM | Admit: 2023-07-05 | Discharge: 2023-07-05 | Disposition: A | Discharge status: Home / Self Care | Payer: Medicare Other

## 2023-07-05 DIAGNOSIS — R0602 Shortness of breath: Secondary | ICD-10-CM | POA: Diagnosis not present

## 2023-07-05 DIAGNOSIS — Z7982 Long term (current) use of aspirin: Secondary | ICD-10-CM | POA: Insufficient documentation

## 2023-07-05 DIAGNOSIS — R0989 Other specified symptoms and signs involving the circulatory and respiratory systems: Secondary | ICD-10-CM | POA: Diagnosis not present

## 2023-07-05 DIAGNOSIS — J101 Influenza due to other identified influenza virus with other respiratory manifestations: Secondary | ICD-10-CM | POA: Insufficient documentation

## 2023-07-05 DIAGNOSIS — R059 Cough, unspecified: Secondary | ICD-10-CM | POA: Diagnosis present

## 2023-07-05 DIAGNOSIS — J111 Influenza due to unidentified influenza virus with other respiratory manifestations: Secondary | ICD-10-CM

## 2023-07-05 LAB — RESP PANEL BY RT-PCR (RSV, FLU A&B, COVID)  RVPGX2
Influenza A by PCR: POSITIVE — AB
Influenza B by PCR: NEGATIVE
Resp Syncytial Virus by PCR: NEGATIVE
SARS Coronavirus 2 by RT PCR: NEGATIVE

## 2023-07-05 MED ORDER — PREDNISONE 10 MG PO TABS: 40.0000 mg | ORAL_TABLET | Freq: Every day | ORAL | 0 refills | Status: AC

## 2023-07-05 MED ORDER — IPRATROPIUM-ALBUTEROL 0.5-2.5 (3) MG/3ML IN SOLN
3.0000 mL | Freq: Once | RESPIRATORY_TRACT | Status: AC
  Administered 2023-07-05: 3 mL via RESPIRATORY_TRACT
  Filled 2023-07-05: qty 3

## 2023-07-05 NOTE — Discharge Instructions (Signed)
 You tested positive for the flu.  Specific treatments for the flu at this time.  Treatment is supportive.  May take over-the-counter Tylenol alternate with ibuprofen for generalized pain.  Your chest x-ray was negative;  You do not have pneumonia.  We are prescribing you medications to help with your shortness of breath.  I would also like you to take albuterol inhaler scheduled every 6 hours for the next 24 hours and then as needed thereafter. Continue to take over the counter mucinex as directed on the packaging.  Return if you develop fevers, chills, chest pain, worsening shortness of breath or any new or worsening symptoms that are concerning to you.

## 2023-07-05 NOTE — ED Provider Notes (Signed)
  EMERGENCY DEPARTMENT AT [facility] Provider Note   CSN: [id number] Arrival date & time: 07/05/23  4098     History  Chief Complaint  Patient presents with   Shortness of Breath    Amy [name] is a 73 y.o. female.  73 year old female presenting emergency department for cough and shortness of breath.  Reports that she had URI symptoms a week and a half ago or so.  She has developed a cough over the past 4 to 5 days.  She feels like she cannot clear phlegm.  Reports some mild shortness of breath this morning.  Denies history of CHF, asthma or COPD, but does have inhalers.   Shortness of Breath      Home Medications Prior to Admission medications   Medication Sig Start Date End Date Taking? Authorizing Provider  predniSONE (DELTASONE) 10 MG tablet Take 4 tablets (40 mg total) by mouth daily for 3 days. 07/05/23 07/08/23 Yes Coral Spikes, DO  acetaminophen (TYLENOL) 500 MG tablet Take 1 tablet (500 mg total) by mouth every 6 (six) hours as needed. 07/05/22   Redwine, Madison A, PA-C  acetaminophen-codeine (TYLENOL #3) 300-30 MG tablet Take 1 tablet by mouth every 6 (six) hours as needed (pain).    [provider]  aspirin EC 325 MG tablet Take 325 mg by mouth daily as needed for mild pain.    [provider]  Continuous Glucose Receiver (FREESTYLE LIBRE 3 READER) DEVI 1 Device by Does not apply route once for 1 dose. Use as directed to monitor patient blood glucose 09/24/22 09/24/22  Sagardia, Eilleen Kempf, MD  Continuous Glucose Sensor (FREESTYLE LIBRE 3 SENSOR) MISC Apply 1 senor to skin every 14 days to monitor blood glucose. Use as directed 09/24/22   Georgina Quint, MD  DOCUSATE SODIUM PO Take 2 capsules by mouth daily as needed (constipation).    [provider]  fluticasone furoate-vilanterol (BREO ELLIPTA) 200-25 MCG/ACT AEPB TAKE 1 PUFF BY MOUTH EVERY DAY 12/16/22   Georgina Quint, MD  ibuprofen (ADVIL) 600 MG tablet  Take 1 tablet (600 mg total) by mouth every 6 (six) hours as needed. 07/05/22   Redwine, Madison A, PA-C  magnesium citrate SOLN Take 1 Bottle by mouth once as needed for severe constipation.    [provider]  metFORMIN (GLUCOPHAGE) 1000 MG tablet Take 1 tablet (1,000 mg total) by mouth 2 (two) times daily with a meal. 08/14/22   Sagardia, Eilleen Kempf, MD  methocarbamol (ROBAXIN) 500 MG tablet Take 1 tablet (500 mg total) by mouth at bedtime. Patient not taking: Reported on 04/02/2023 07/05/22   Redwine, Madison A, PA-C  Multiple Vitamin (MULTIVITAMIN) tablet Take 1 tablet by mouth daily.    [provider]  ondansetron (ZOFRAN) 4 MG tablet Take 1 tablet (4 mg total) by mouth every 6 (six) hours. Patient not taking: Reported on 04/02/2023 07/05/22   Redwine, Madison A, PA-C  polyethylene glycol powder (GLYCOLAX/MIRALAX) 17 GM/SCOOP powder Take 17 g by mouth daily as needed (constipation).    [provider]      Allergies    Patient has no known allergies.    Review of Systems   Review of Systems  Respiratory:  Positive for shortness of breath.     Physical Exam Updated Vital Signs BP (!) 143/82   Pulse 77   Temp 98 F (36.7 C) (Oral)   Resp 20   Ht 5\' 6"  (1.676 m)   Wt 99.8  kg   LMP 05/12/1982   SpO2 99%   BMI 35.51 kg/m  Physical Exam Vitals and nursing note reviewed.  Constitutional:      General: She is not in acute distress.    Appearance: She is not toxic-appearing.  Cardiovascular:     Rate and Rhythm: Normal rate and regular rhythm.  Pulmonary:     Effort: Pulmonary effort is normal. No accessory muscle usage or respiratory distress.     Breath sounds: No wheezing, rhonchi or rales.  Musculoskeletal:     Cervical back: Normal range of motion.     Right lower leg: No edema.     Left lower leg: No edema.  Skin:    General: Skin is warm.     Capillary Refill: Capillary refill takes less than 2 seconds.  Neurological:     Mental Status:  She is oriented to person, place, and time.  Psychiatric:        Mood and Affect: Mood normal.        Behavior: Behavior normal.     ED Results / Procedures / Treatments   Labs (all labs ordered are listed, but only abnormal results are displayed) Labs Reviewed  RESP PANEL BY RT-PCR (RSV, FLU A&B, COVID)  [id number] - Abnormal; Notable for the following components:      Result Value   Influenza A by PCR POSITIVE (*)    All other components within normal limits    EKG None  Radiology DG Chest 2 View Result Date: 07/05/2023 CLINICAL DATA:  Shortness of breath and chest congestion for 2 days. EXAM: CHEST - 2 VIEW COMPARISON:  07/05/2022 FINDINGS: The heart size and mediastinal contours are within normal limits. Both lungs are clear. The visualized skeletal structures are unremarkable. IMPRESSION: No active cardiopulmonary disease. Electronically Signed   By: Danae Orleans M.D.   On: 07/05/2023 09:32    Procedures Procedures    Medications Ordered in ED Medications  ipratropium-albuterol (DUONEB) 0.5-2.5 (3) MG/3ML nebulizer solution 3 mL (3 mLs Nebulization Given 07/05/23 0945)    ED Course/ Medical Decision Making/ A&P Clinical Course as of 07/05/23 0957  Sun Jul 05, 2023  0941 DG Chest 2 View IMPRESSION: No active cardiopulmonary disease.   [TY]  0946 Influenza A By PCR(!): POSITIVE [TY]  0956 Patient is feeling proved after breathing treatment.  Updated on test results.  He is outside window for Tamiflu.  Will discharge with short course of steroids as I suspect an underlying COPD component of her [facility].  [TY]    Clinical Course User Index [TY] Coral Spikes, DO                                 Medical Decision Making This is a well-appearing 73 year old female presenting emergency department for cough and shortness of breath.  She is afebrile nontachycardic hemodynamically stable.  Maintaining oxygen saturation on room air.  She is not in respiratory distress.  Clear  lungs.  Bronchitis versus pneumonia versus postviral cough.  Given constellation of symptoms and prevalence of flu and COVID at this time, we will get flu/COVID test.  Will get chest x-ray to exclude pneumonia.  Will treat with DuoNeb awaiting results.  Amount and/or Complexity of Data Reviewed Labs:  Decision-making details documented in ED Course. Radiology: ordered. Decision-making details documented in ED Course.  Risk Prescription drug management.          Final  Clinical Impression(s) / ED Diagnoses Final diagnoses:  Influenza    Rx / DC Orders ED Discharge Orders          Ordered    predniSONE (DELTASONE) 10 MG tablet  Daily        07/05/23 0947              Coral Spikes, DO 07/05/23 [phone]

## 2023-07-05 NOTE — ED Notes (Addendum)
DC home

## 2023-07-05 NOTE — ED Triage Notes (Signed)
 Pt Amy [name], ambulatory, NAD c/o nonproductive cough, congestion, and SOB since Friday. Pt reports she had cold like s/s approx 2 wks ago along with her family but the SOB is new and she feels like she has mucous stuck in throat that she can't get up. SOB worsens at night. Denies CHF or COPD PMH.

## 2023-07-06 ENCOUNTER — Telehealth: Payer: Self-pay

## 2023-07-06 ENCOUNTER — Telehealth: Payer: Self-pay | Admitting: Emergency Medicine

## 2023-07-06 NOTE — Telephone Encounter (Signed)
 Patient was advised to go to pharmacy for Shingle vaccine due to her insurance

## 2023-07-06 NOTE — Transitions of Care (Post Inpatient/ED Visit) (Signed)
 07/06/2023  Name: Amy [name] MRN: [id number] DOB: 12-Oct-1950  Today's TOC FU Call Status:   Patient's Name and Date of Birth confirmed.  Transition Care Management Follow-up Telephone Call Date of Discharge: 07/05/23 Discharge Facility: [facility] ([facility]) Type of Discharge: Emergency Department Reason for ED Visit: Other: (flu) How have you been since you were released from the hospital?: Better Any questions or concerns?: No  Items Reviewed: Did you receive and understand the discharge instructions provided?: Yes Medications obtained,verified, and reconciled?: Yes (Medications Reviewed) Any new allergies since your discharge?: No Dietary orders reviewed?: NA Do you have support at home?: Yes  Medications Reviewed Today: Medications Reviewed Today     Reviewed by Marinus Maw, CMA (Certified Medical Assistant) on 07/06/23 at 1358  Med List Status: <None>   Medication Order Taking? Sig Documenting Provider Last Dose Status Informant  acetaminophen (TYLENOL) 500 MG tablet [id number] Yes Take 1 tablet (500 mg total) by mouth every 6 (six) hours as needed. Redwine, Madison A, PA-C Taking Active   acetaminophen-codeine (TYLENOL #3) 300-30 MG tablet [id number] Yes Take 1 tablet by mouth every 6 (six) hours as needed (pain). [provider] Taking Active Self, Pharmacy Records  aspirin EC 325 MG tablet [id number] Yes Take 325 mg by mouth daily as needed for mild pain. [provider] Taking Active Self  Continuous Glucose Receiver (FREESTYLE LIBRE 3 READER) DEVI [id number]  1 Device by Does not apply route once for 1 dose. Use as directed to monitor patient blood glucose Georgina Quint, MD  Expired 09/24/22 2359   Continuous Glucose Sensor (FREESTYLE LIBRE 3 SENSOR) MISC [id number] Yes Apply 1 senor to skin every 14 days to monitor blood glucose. Use as directed Georgina Quint, MD Taking Active   DOCUSATE SODIUM PO [id number] Yes Take 2 capsules  by mouth daily as needed (constipation). [provider] Taking Active Self  fluticasone furoate-vilanterol (BREO ELLIPTA) 200-25 MCG/ACT AEPB [id number] Yes TAKE 1 PUFF BY MOUTH EVERY DAY Sagardia, Eilleen Kempf, MD Taking Active   ibuprofen (ADVIL) 600 MG tablet [id number] Yes Take 1 tablet (600 mg total) by mouth every 6 (six) hours as needed. Redwine, Madison A, PA-C Taking Active   magnesium citrate SOLN [id number] Yes Take 1 Bottle by mouth once as needed for severe constipation. [provider] Taking Active Self           Med Note (COFFELL, Herb Grays Feb 20, 2022  8:44 AM) Pt states this is the only medication that helps with her constipation but has been on and off backorder.  metFORMIN (GLUCOPHAGE) 1000 MG tablet [id number] Yes Take 1 tablet (1,000 mg total) by mouth 2 (two) times daily with a meal. Sagardia, Eilleen Kempf, MD Taking Active   methocarbamol (ROBAXIN) 500 MG tablet [id number] No Take 1 tablet (500 mg total) by mouth at bedtime.  Patient not taking: Reported on 07/06/2023   Saddie Benders, PA-C Not Taking Active   Multiple Vitamin (MULTIVITAMIN) tablet [id number] Yes Take 1 tablet by mouth daily. [provider] Taking Active Self  ondansetron (ZOFRAN) 4 MG tablet [id number] No Take 1 tablet (4 mg total) by mouth every 6 (six) hours.  Patient not taking: Reported on 07/06/2023   Redwine, Wyn Forster A, PA-C Not Taking Active   polyethylene glycol powder (GLYCOLAX/MIRALAX) 17 GM/SCOOP powder [id number] Yes Take 17 g by mouth daily as needed (constipation). [provider] Taking Active Self  predniSONE (DELTASONE) 10 MG tablet [id number] Yes Take 4  tablets (40 mg total) by mouth daily for 3 days. Coral Spikes, DO Taking Active             Home Care and Equipment/Supplies: Were Home Health Services Ordered?: NA Any new equipment or medical supplies ordered?: NA  Functional Questionnaire: Do you need assistance with bathing/showering or  dressing?: No Do you need assistance with meal preparation?: No Do you need assistance with eating?: No Do you have difficulty maintaining continence: No Do you need assistance with getting out of bed/getting out of a chair/moving?: No Do you have difficulty managing or taking your medications?: No  Follow up appointments reviewed: PCP Follow-up appointment confirmed?: NA Specialist Hospital Follow-up appointment confirmed?: NA Do you need transportation to your follow-up appointment?: No Do you understand care options if your condition(s) worsen?: Yes-patient verbalized understanding    SIGNATURE: Jordan Hawks

## 2023-07-06 NOTE — Telephone Encounter (Signed)
 Copied from CRM [id number]. Topic: Appointments - Scheduling Inquiry for Clinic >> Jul 06, 2023  8:52 AM Amy [name] wrote: Reason for CRM: patient would like to schedule shingles injection, callback [phone]  ---  Does this pt need orders for the shingles vaccine?  TDW

## 2023-07-09 DIAGNOSIS — M1611 Unilateral primary osteoarthritis, right hip: Secondary | ICD-10-CM | POA: Diagnosis not present

## 2023-07-09 DIAGNOSIS — M5416 Radiculopathy, lumbar region: Secondary | ICD-10-CM | POA: Diagnosis not present

## 2023-08-19 DIAGNOSIS — M1611 Unilateral primary osteoarthritis, right hip: Secondary | ICD-10-CM | POA: Diagnosis not present

## 2023-08-19 DIAGNOSIS — M5416 Radiculopathy, lumbar region: Secondary | ICD-10-CM | POA: Diagnosis not present

## 2023-08-20 ENCOUNTER — Other Ambulatory Visit: Payer: Self-pay | Admitting: Rehabilitation

## 2023-08-20 DIAGNOSIS — M1611 Unilateral primary osteoarthritis, right hip: Secondary | ICD-10-CM

## 2023-08-29 ENCOUNTER — Ambulatory Visit
Admission: RE | Admit: 2023-08-29 | Discharge: 2023-08-29 | Disposition: A | Discharge status: Home / Self Care | Source: Ambulatory Visit | Attending: Rehabilitation | Admitting: Rehabilitation

## 2023-08-29 DIAGNOSIS — M25551 Pain in right hip: Secondary | ICD-10-CM | POA: Diagnosis not present

## 2023-08-29 DIAGNOSIS — M1611 Unilateral primary osteoarthritis, right hip: Secondary | ICD-10-CM | POA: Diagnosis not present

## 2023-09-03 DIAGNOSIS — M5416 Radiculopathy, lumbar region: Secondary | ICD-10-CM | POA: Diagnosis not present

## 2023-09-03 DIAGNOSIS — M1611 Unilateral primary osteoarthritis, right hip: Secondary | ICD-10-CM | POA: Diagnosis not present

## 2023-09-04 LAB — HM DIABETES EYE EXAM

## 2023-09-24 ENCOUNTER — Encounter: Payer: Self-pay | Admitting: Physician Assistant

## 2023-09-24 ENCOUNTER — Ambulatory Visit: Admitting: Physician Assistant

## 2023-09-24 ENCOUNTER — Other Ambulatory Visit (INDEPENDENT_AMBULATORY_CARE_PROVIDER_SITE_OTHER): Payer: Self-pay

## 2023-09-24 VITALS — Ht 66.34 in | Wt 221.6 lb

## 2023-09-24 DIAGNOSIS — M25551 Pain in right hip: Secondary | ICD-10-CM

## 2023-09-24 DIAGNOSIS — M1611 Unilateral primary osteoarthritis, right hip: Secondary | ICD-10-CM

## 2023-09-24 NOTE — Progress Notes (Signed)
 Office Visit Note   Patient: Amy [name]           Date of Birth: 11/26/1950           MRN: [id number] Visit Date: 09/24/2023              Requested by: Elvira Hammersmith, MD [address] [place],  [place] [address] PCP: Elvira Hammersmith, MD   Assessment & Plan: Visit Diagnoses:  1. Pain in right hip   2. Osteoarthritis of right hip, unspecified osteoarthritis type     Plan: Patient continues to have right hip pain despite conservative treatment which is included medications and activity modification.  She is unable to have a active lifestyle or exercise due to the right hip pain.  MRI and radiographic changes showed moderate to moderately severe degenerative changes of the right hip.  Discussed conservative treatment which would be an intra-articular injection of the right hip.  Patient is not interested in an injection.  She would like to discuss surgical options.  Therefore we will consents right total hip arthroplasty perioperative and postoperative protocol.  Risk benefits surgery discussed risk includes but is not limited to nerve vessel injury, infection, DVT/PE, wound healing problems and blood loss.  She would like to set up surgery for the last part of June and to be done on Tuesday.  Questions were encouraged and answered by Dr. Leighton Punches myself.  Handout on anterior total hip arthroplasty was given.  Follow-Up Instructions: Return for post op.   Orders:  Orders Placed This Encounter  Procedures   XR HIP UNILAT W OR W/O PELVIS 2-3 VIEWS RIGHT   No orders of the defined types were placed in this encounter.     Procedures: No procedures performed   Clinical Data: No additional findings.   Subjective: Chief Complaint  Patient presents with   Right Hip - Pain    HPI Amy [name] is a very pleasant 73 year old female who comes in today with right hip pain.  She has had increasing hip pain since the December.  She had a fall onto the hip some 18 years  ago and struck the hip on the step.  She is now having significant groin pain which she describes to 8-9 out of 10 pain.  Notes she has difficulty putting on shoes and socks.  She has to limit her activities due to pain.  She has tried ibuprofen  and Aleve without any significant relief.  She is diabetic last hemoglobin A1c on 04/07/2023 was 6.9.  Denies any chest pain shortness of breath fevers chills history of DVT or PE. Underwent MRI right hip without contrast on 09/02/2023.  This shows degenerative changes consistent with moderate degenerative changes involving the right hip.  Small right hip joint effusion.  Diffuse degenerative changes of the labrum.  No obvious AVN.  Hips well located.  Review of Systems  Constitutional:  Negative for chills and fever.  Respiratory:  Negative for shortness of breath.   Cardiovascular:  Negative for chest pain.  Musculoskeletal:  Positive for gait problem.     Objective: Vital Signs: Ht 5' 6.34" (1.685 m)   Wt 221 lb 9.6 oz (100.5 kg)   LMP 05/12/1982   BMI 35.40 kg/m   Physical Exam Constitutional:      Appearance: She is not ill-appearing or diaphoretic.  Pulmonary:     Effort: Pulmonary effort is normal.  Neurological:     Mental Status: She is alert and oriented to  person, place, and time.     Ortho Exam Ambulates without any assistive device with significant antalgic gait.  Bilateral hips: Left hip excellent range of motion without pain right hip decreased internal rotation and significant pain with internal rotation.  External rotation is nonpainful and normal.  Dorsiflexion plantarflexion bilateral ankles intact calf supple nontender bilaterally.  Specialty Comments:  No specialty comments available.  Imaging: XR HIP UNILAT W OR W/O PELVIS 2-3 VIEWS RIGHT Result Date: 09/24/2023 Right hip AP pelvis and lateral views: Bilateral hips well located.  Left hip is well-preserved.  Right hip is near bone-on-bone weightbearing surface.   Sclerotic changes of the femoral head and acetabulum noted right hip.  No acute fractures.  Both hips are well located.    PMFS History: Patient Active Problem List   Diagnosis Date Noted   Hemorrhoids 12/18/2020   Abnormal EKG 04/27/2019   Mild intermittent asthma 10/26/2017   Morbid (severe) obesity due to excess calories (HCC) 09/29/2016   Diabetes (HCC) 08/10/2013   History of kidney stones    Colon polyp, hyperplastic 03/29/2010   Past Medical History:  Diagnosis Date   Colon polyp, hyperplastic 03/29/2010   Diabetes mellitus    History of kidney stones    Mild intermittent asthma     Family History  Problem Relation Age of Onset   Stomach cancer Maternal Grandmother    Heart disease Maternal Grandfather    Heart attack Maternal Grandfather    Diabetes Father    Hypertension Mother    Diabetes Brother    Hypertension Sister    Allergies Sister    Allergies Daughter     Past Surgical History:  Procedure Laterality Date   CYST EXCISION Left 15 years ago   Axillary    ECTOPIC PREGNANCY SURGERY     OVARIAN CYST REMOVAL  35 years ago   TOTAL ABDOMINAL HYSTERECTOMY  1984   Social History   Occupational History    Employer: [employer]  Tobacco Use   Smoking status: Former    Current packs/day: 0.00    Average packs/day: 0.5 packs/day for 12.0 years (6.0 ttl pk-yrs)    Types: Cigarettes    Start date: 05/12/1968    Quit date: 05/12/1980    Years since quitting: 43.3   Smokeless tobacco: Never  Vaping Use   Vaping status: Never Used  Substance and Sexual Activity   Alcohol use: Yes    Alcohol/week: 0.0 standard drinks of alcohol    Comment: "Birthdays"   Drug use: No   Sexual activity: Yes    Partners: Male    Birth control/protection: Surgical    Comment: Hysterectomy

## 2023-10-06 ENCOUNTER — Ambulatory Visit (INDEPENDENT_AMBULATORY_CARE_PROVIDER_SITE_OTHER)

## 2023-10-06 VITALS — Ht 66.0 in | Wt 221.0 lb

## 2023-10-06 DIAGNOSIS — Z0000 Encounter for general adult medical examination without abnormal findings: Secondary | ICD-10-CM | POA: Diagnosis not present

## 2023-10-06 DIAGNOSIS — Z780 Asymptomatic menopausal state: Secondary | ICD-10-CM | POA: Diagnosis not present

## 2023-10-06 DIAGNOSIS — E1165 Type 2 diabetes mellitus with hyperglycemia: Secondary | ICD-10-CM

## 2023-10-06 DIAGNOSIS — Z1159 Encounter for screening for other viral diseases: Secondary | ICD-10-CM | POA: Diagnosis not present

## 2023-10-06 NOTE — Patient Instructions (Signed)
 Ms. Kernes , Thank you for taking time out of your busy schedule to complete your Annual Wellness Visit with me. I enjoyed our conversation and look forward to speaking with you again next year. I, as well as your care team,  appreciate your ongoing commitment to your health goals. Please review the following plan we discussed and let me know if I can assist you in the future. Your Game plan/ To Do List    Referrals: If you haven't heard from the office you've been referred to, please reach out to them at the phone provided.  You have an order for:  [x]   Bone Density     Please call for appointment:  [facility] [address] [place], [place] [address] [phone]    Make sure to wear two-piece clothing.  No lotions, powders, or deodorants the day of the appointment. Make sure to bring picture ID and insurance card.  Bring list of medications you are currently taking including any supplements.   Follow up Visits: Next Medicare AWV with our clinical staff: 10/06/2024.   Have you seen your provider in the last 6 months (3 months if uncontrolled diabetes)? Yes Next Office Visit with your provider: 10/07/2023.  Clinician Recommendations:  Aim for 30 minutes of exercise or brisk walking, 6-8 glasses of water, and 5 servings of fruits and vegetables each day. You are due for a foot exam, a A1C check, a Hep C screening, which can be done during your next office visit.  You also are due for a Shingles vaccine.        This is a list of the screening recommended for you and due dates:  Health Maintenance  Topic Date Due   Complete foot exam   Never done   Zoster (Shingles) Vaccine (1 of 2) Never done   Eye exam for diabetics  04/09/2022   COVID-19 Vaccine (6 - 2024-25 season) 06/12/2023   Hemoglobin A1C  10/05/2023   Hepatitis C Screening  04/01/2024*   Flu Shot  12/11/2023   Yearly kidney function blood test for diabetes  02/13/2024   Yearly kidney health  urinalysis for diabetes  02/13/2024   Medicare Annual Wellness Visit  04/01/2024   DTaP/Tdap/Td vaccine (3 - Td or Tdap) 06/06/2024   Mammogram  12/02/2024   Colon Cancer Screening  04/11/2031   Pneumonia Vaccine  Completed   DEXA scan (bone density measurement)  Completed   HPV Vaccine  Aged Out   Meningitis B Vaccine  Aged Out  *Topic was postponed. The date shown is not the original due date.    Advanced directives: (Declined) Advance directive discussed with you today. Even though you declined this today, please call our office should you change your mind, and we can give you the proper paperwork for you to fill out. Advance Care Planning is important because it:  [x]  Makes sure you receive the medical care that is consistent with your values, goals, and preferences  [x]  It provides guidance to your family and loved ones and reduces their decisional burden about whether or not they are making the right decisions based on your wishes.  Follow the link provided in your after visit summary or read over the paperwork we have mailed to you to help you started getting your Advance Directives in place. If you need assistance in completing these, please reach out to us  so that we can help you!  See attachments for Preventive Care and Fall Prevention Tips.

## 2023-10-06 NOTE — Progress Notes (Signed)
 Subjective:   Amy [name] is a 73 y.o. who presents for a Medicare Wellness preventive visit.  As a reminder, Annual Wellness Visits don't include a physical exam, and some assessments may be limited, especially if this visit is performed virtually. We may recommend an in-person follow-up visit with your provider if needed.  Visit Complete: Virtual I connected with  Suzann Ernst on 10/06/23 by a audio enabled telemedicine application and verified that I am speaking with the correct person using two identifiers.  Patient Location: Home  Provider Location: Home Office  I discussed the limitations of evaluation and management by telemedicine. The patient expressed understanding and agreed to proceed.  Vital Signs: Because this visit was a virtual/telehealth visit, some criteria may be missing or patient reported. Any vitals not documented were not able to be obtained and vitals that have been documented are patient reported.  VideoDeclined- This patient declined Librarian, academic. Therefore the visit was completed with audio only.  Persons Participating in Visit: Patient.  AWV Questionnaire: No: Patient Medicare AWV questionnaire was not completed prior to this visit.  Cardiac Risk Factors include: advanced age (>80men, >65 women);diabetes mellitus     Objective:     Today's Vitals   10/06/23 1304  Weight: 221 lb (100.2 kg)  Height: 5\' 6"  (1.676 m)   Body mass index is 35.67 kg/m.     10/06/2023    1:16 PM 07/05/2023    8:59 AM 04/02/2023    9:43 AM 07/05/2022    9:49 AM 04/16/2022    2:39 PM 02/20/2022    5:55 AM 10/04/2020    1:19 PM  Advanced Directives  Does Patient Have a Medical Advance Directive? No No No No No No No  Would patient like information on creating a medical advance directive?  No - Patient declined No - Patient declined No - Patient declined No - Patient declined      Current Medications (verified) Outpatient  Encounter Medications as of 10/06/2023  Medication Sig   acetaminophen  (TYLENOL ) 500 MG tablet Take 1 tablet (500 mg total) by mouth every 6 (six) hours as needed.   aspirin  EC 325 MG tablet Take 325 mg by mouth daily as needed for mild pain.   fluticasone  furoate-vilanterol (BREO ELLIPTA ) 200-25 MCG/ACT AEPB TAKE 1 PUFF BY MOUTH EVERY DAY   ibuprofen  (ADVIL ) 600 MG tablet Take 1 tablet (600 mg total) by mouth every 6 (six) hours as needed.   metFORMIN  (GLUCOPHAGE ) 1000 MG tablet Take 1 tablet (1,000 mg total) by mouth 2 (two) times daily with a meal.   Multiple Vitamin (MULTIVITAMIN) tablet Take 1 tablet by mouth daily.   polyethylene glycol powder (GLYCOLAX/MIRALAX) 17 GM/SCOOP powder Take 17 g by mouth daily as needed (constipation).   Continuous Glucose Receiver (FREESTYLE LIBRE 3 READER) DEVI 1 Device by Does not apply route once for 1 dose. Use as directed to monitor patient blood glucose   Continuous Glucose Sensor (FREESTYLE LIBRE 3 SENSOR) MISC Apply 1 senor to skin every 14 days to monitor blood glucose. Use as directed (Patient not taking: Reported on 10/06/2023)   DOCUSATE SODIUM PO Take 2 capsules by mouth daily as needed (constipation). (Patient not taking: Reported on 10/06/2023)   magnesium  citrate SOLN Take 1 Bottle by mouth once as needed for severe constipation. (Patient not taking: Reported on 10/06/2023)   No facility-administered encounter medications on file as of 10/06/2023.    Allergies (verified) Patient has no known allergies.  History: Past Medical History:  Diagnosis Date   Colon polyp, hyperplastic 03/29/2010   Diabetes mellitus    History of kidney stones    Mild intermittent asthma    Past Surgical History:  Procedure Laterality Date   CYST EXCISION Left 15 years ago   Axillary    ECTOPIC PREGNANCY SURGERY     OVARIAN CYST REMOVAL  35 years ago   TOTAL ABDOMINAL HYSTERECTOMY  1984   Family History  Problem Relation Age of Onset   Stomach cancer  Maternal Grandmother    Heart disease Maternal Grandfather    Heart attack Maternal Grandfather    Diabetes Father    Hypertension Mother    Diabetes Brother    Hypertension Sister    Allergies Sister    Allergies Daughter    Social History   Socioeconomic History   Marital status: Married    Spouse name: Jimmy   Number of children: 2   Years of education: Not on file   Highest education level: Not on file  Occupational History   Occupation: RETIRED    Employer: [employer]  Tobacco Use   Smoking status: Former    Current packs/day: 0.00    Average packs/day: 0.5 packs/day for 12.0 years (6.0 ttl pk-yrs)    Types: Cigarettes    Start date: 05/12/1968    Quit date: 05/12/1980    Years since quitting: 43.4   Smokeless tobacco: Never  Vaping Use   Vaping status: Never Used  Substance and Sexual Activity   Alcohol use: Yes    Alcohol/week: 0.0 standard drinks of alcohol    Comment: "Birthdays"   Drug use: No   Sexual activity: Yes    Partners: Male    Birth control/protection: Surgical    Comment: Hysterectomy  Other Topics Concern   Not on file  Social History Narrative   Lives with husband/2025   Social Drivers of Health   Financial Resource Strain: Low Risk  (10/06/2023)   Overall Financial Resource Strain (CARDIA)    Difficulty of Paying Living Expenses: Not very hard  Food Insecurity: No Food Insecurity (10/06/2023)   Hunger Vital Sign    Worried About Running Out of Food in the Last Year: Never true    Ran Out of Food in the Last Year: Never true  Transportation Needs: No Transportation Needs (10/06/2023)   PRAPARE - Administrator, Civil Service (Medical): No    Lack of Transportation (Non-Medical): No  Physical Activity: Inactive (10/06/2023)   Exercise Vital Sign    Days of Exercise per Week: 0 days    Minutes of Exercise per Session: 0 min  Stress: No Stress Concern Present (10/06/2023)   [employer] of Occupational Health -  Occupational Stress Questionnaire    Feeling of Stress : Not at all  Social Connections: Socially Integrated (10/06/2023)   Social Connection and Isolation Panel [NHANES]    Frequency of Communication with Friends and Family: Three times a week    Frequency of Social Gatherings with Friends and Family: Once a week    Attends Religious Services: More than 4 times per year    Active Member of [employer] or Organizations: Yes    Attends Banker Meetings: Never    Marital Status: Married    Tobacco Counseling Counseling given: Not Answered    Clinical Intake:  Pre-visit preparation completed: Yes  Pain : No/denies pain     BMI - recorded: 35.67 Nutritional Status: BMI >  30  Obese Nutritional Risks: None Diabetes: Yes CBG done?: No Did pt. bring in CBG monitor from home?: No  Lab Results  Component Value Date   HGBA1C 6.9 (A) 04/07/2023   HGBA1C 7.2 (A) 09/16/2022   HGBA1C 7.3 (A) 06/17/2022     How often do you need to have someone help you when you read instructions, pamphlets, or other written materials from your doctor or pharmacy?: 1 - Never  Interpreter Needed?: No  Information entered by :: Deshannon Hinchliffe, RMA   Activities of Daily Living     10/06/2023    1:09 PM 04/02/2023    9:44 AM  In your present state of health, do you have any difficulty performing the following activities:  Hearing? 0 0  Vision? 0 0  Difficulty concentrating or making decisions? 0 0  Walking or climbing stairs? 0 0  Dressing or bathing? 0 0  Doing errands, shopping? 0 0  Preparing Food and eating ? N N  Using the Toilet? N N  In the past six months, have you accidently leaked urine? N N  Do you have problems with loss of bowel control? N N  Managing your Medications? N N  Managing your Finances? N N  Housekeeping or managing your Housekeeping? N N    Patient Care Team: Elvira Hammersmith, MD as PCP - General (Internal Medicine)  Indicate any recent Medical  Services you may have received from other than [facility] providers in the past year (date may be approximate).     Assessment:    This is a routine wellness examination for Ellamarie.  Hearing/Vision screen Hearing Screening - Comments:: Denies hearing difficulties   Vision Screening - Comments:: Wears eyeglasses/for driving/ My eye doctor/[facility]   Goals Addressed             This Visit's Progress    Client understands the importance of follow-up with providers by attending scheduled visits   On track      Depression Screen     10/06/2023    1:22 PM 04/07/2023   10:31 AM 04/02/2023    9:42 AM 09/16/2022   10:04 AM 07/03/2022    3:42 PM 06/17/2022   10:51 AM 03/12/2022    8:26 AM  PHQ 2/9 Scores  PHQ - 2 Score 0 0 1 0 0 0 0  PHQ- 9 Score 0  1        Fall Risk     10/06/2023    1:19 PM 04/07/2023   10:31 AM 04/02/2023    9:37 AM 09/16/2022   10:04 AM 06/17/2022   10:51 AM  Fall Risk   Falls in the past year? 0 0 0 0 0  Number falls in past yr: 0 0 0 0 0  Injury with Fall? 0 0 0 0 0  Risk for fall due to :  No Fall Risks  No Fall Risks No Fall Risks  Follow up Falls evaluation completed;Falls prevention discussed Falls evaluation completed Falls evaluation completed;Education provided;Falls prevention discussed Falls evaluation completed Falls evaluation completed    MEDICARE RISK AT HOME:  Medicare Risk at Home Any stairs in or around the home?: No If so, are there any without handrails?: No Home free of loose throw rugs in walkways, pet beds, electrical cords, etc?: Yes Adequate lighting in your home to reduce risk of falls?: Yes Life alert?: No Use of a cane, walker or w/c?: No Grab bars in the bathroom?: Yes Shower chair or bench  in shower?: No Elevated toilet seat or a handicapped toilet?: No  TIMED UP AND GO:  Was the test performed?  No  Cognitive Function: Declined/Normal: No cognitive concerns noted by patient or family. Patient alert, oriented, able to  answer questions appropriately and recall recent events. No signs of memory loss or confusion.        04/02/2023    9:44 AM 04/16/2022    2:41 PM  6CIT Screen  What Year? 0 points 0 points  What month? 0 points 0 points  What time? 0 points 0 points  Count back from 20 4 points 0 points  Months in reverse 4 points 0 points  Repeat phrase 8 points 0 points  Total Score 16 points 0 points    Immunizations Immunization History  Administered Date(s) Administered   DTaP 06/06/2014   Fluad Quad(high Dose 65+) 05/17/2018, 04/26/2020, 03/01/2021, 03/18/2022   Fluad Trivalent(High Dose 65+) 04/15/2023   Influenza Split 05/17/2012, 04/19/2014   Influenza, Seasonal, Injecte, Preservative Fre 04/19/2014, 05/30/2015   Influenza,inj,quad, With Preservative 04/19/2014, 03/12/2016, 04/09/2017   PFIZER(Purple Top)SARS-COV-2 Vaccination 06/18/2019, 07/11/2019, 03/21/2020   PNEUMOCOCCAL CONJUGATE-20 04/30/2021   Pfizer Covid-19 Vaccine Bivalent Booster 34yrs & up 03/04/2021, 04/17/2023   Pneumococcal Conjugate-13 05/27/2018   Pneumococcal Polysaccharide-23 06/06/2014   Tdap 06/06/2014    Screening Tests Health Maintenance  Topic Date Due   FOOT EXAM  Never done   Zoster Vaccines- Shingrix (1 of 2) Never done   OPHTHALMOLOGY EXAM  04/09/2022   COVID-19 Vaccine (6 - 2024-25 season) 06/12/2023   HEMOGLOBIN A1C  10/05/2023   Hepatitis C Screening  04/01/2024 (Originally 09/20/1968)   INFLUENZA VACCINE  12/11/2023   Diabetic kidney evaluation - eGFR measurement  02/13/2024   Diabetic kidney evaluation - Urine ACR  02/13/2024   Medicare Annual Wellness (AWV)  04/01/2024   DTaP/Tdap/Td (3 - Td or Tdap) 06/06/2024   MAMMOGRAM  12/02/2024   Colonoscopy  04/11/2031   Pneumonia Vaccine 5+ Years old  Completed   DEXA SCAN  Completed   HPV VACCINES  Aged Out   Meningococcal B Vaccine  Aged Out    Health Maintenance  Health Maintenance Due  Topic Date Due   FOOT EXAM  Never done   Zoster  Vaccines- Shingrix (1 of 2) Never done   OPHTHALMOLOGY EXAM  04/09/2022   COVID-19 Vaccine (6 - 2024-25 season) 06/12/2023   HEMOGLOBIN A1C  10/05/2023   Health Maintenance Items Addressed: DEXA ordered, A1C, Hepatitis C Screening, See Nurse Notes  Additional Screening:  Vision Screening: Recommended annual ophthalmology exams for early detection of glaucoma and other disorders of the eye.  Dental Screening: Recommended annual dental exams for proper oral hygiene  Community Resource Referral / Chronic Care Management: CRR required this visit?  No   CCM required this visit?  No   Plan:    I have personally reviewed and noted the following in the patient's chart:   Medical and social history Use of alcohol, tobacco or illicit drugs  Current medications and supplements including opioid prescriptions. Patient is not currently taking opioid prescriptions. Functional ability and status Nutritional status Physical activity Advanced directives List of other physicians Hospitalizations, surgeries, and ER visits in previous 12 months Vitals Screenings to include cognitive, depression, and falls Referrals and appointments  In addition, I have reviewed and discussed with patient certain preventive protocols, quality metrics, and best practice recommendations. A written personalized care plan for preventive services as well as general preventive health recommendations were provided  to patient.   Mica Ramdass L Indria Bishara, CMA   10/06/2023   After Visit Summary: (MyChart) Due to this being a telephonic visit, the after visit summary with patients personalized plan was offered to patient via MyChart   Notes: Please refer to Routing Comments.

## 2023-10-07 ENCOUNTER — Encounter: Payer: Self-pay | Admitting: Emergency Medicine

## 2023-10-07 ENCOUNTER — Ambulatory Visit (INDEPENDENT_AMBULATORY_CARE_PROVIDER_SITE_OTHER): Payer: Medicare Other | Admitting: Emergency Medicine

## 2023-10-07 ENCOUNTER — Ambulatory Visit: Payer: Self-pay | Admitting: Emergency Medicine

## 2023-10-07 VITALS — BP 130/88 | HR 70 | Temp 98.7°F | Ht 66.0 in | Wt 224.0 lb

## 2023-10-07 DIAGNOSIS — Z7984 Long term (current) use of oral hypoglycemic drugs: Secondary | ICD-10-CM | POA: Diagnosis not present

## 2023-10-07 DIAGNOSIS — Z1159 Encounter for screening for other viral diseases: Secondary | ICD-10-CM

## 2023-10-07 DIAGNOSIS — J4520 Mild intermittent asthma, uncomplicated: Secondary | ICD-10-CM

## 2023-10-07 DIAGNOSIS — E1165 Type 2 diabetes mellitus with hyperglycemia: Secondary | ICD-10-CM | POA: Diagnosis not present

## 2023-10-07 LAB — COMPREHENSIVE METABOLIC PANEL WITH GFR
ALT: 22 U/L (ref 0–35)
AST: 28 U/L (ref 0–37)
Albumin: 4.1 g/dL (ref 3.5–5.2)
Alkaline Phosphatase: 66 U/L (ref 39–117)
BUN: 7 mg/dL (ref 6–23)
CO2: 31 meq/L (ref 19–32)
Calcium: 9.8 mg/dL (ref 8.4–10.5)
Chloride: 101 meq/L (ref 96–112)
Creatinine, Ser: 0.51 mg/dL (ref 0.40–1.20)
GFR: 94.16 mL/min (ref >60.00)
Glucose, Bld: 131 mg/dL — ABNORMAL HIGH (ref 70–99)
Potassium: 4.1 meq/L (ref 3.5–5.1)
Sodium: 138 meq/L (ref 135–145)
Total Bilirubin: 0.4 mg/dL (ref 0.2–1.2)
Total Protein: 7.4 g/dL (ref 6.0–8.3)

## 2023-10-07 LAB — CBC WITH DIFFERENTIAL/PLATELET
Basophils Absolute: 0 10*3/uL (ref 0.0–0.1)
Basophils Relative: 0.2 % (ref 0.0–3.0)
Eosinophils Absolute: 0.2 10*3/uL (ref 0.0–0.7)
Eosinophils Relative: 3.1 % (ref 0.0–5.0)
HCT: 41.1 % (ref 36.0–46.0)
Hemoglobin: 13.6 g/dL (ref 12.0–15.0)
Lymphocytes Relative: 33 % (ref 12.0–46.0)
Lymphs Abs: 2 10*3/uL (ref 0.7–4.0)
MCHC: 33 g/dL (ref 30.0–36.0)
MCV: 87.5 fl (ref 78.0–100.0)
Monocytes Absolute: 0.4 10*3/uL (ref 0.1–1.0)
Monocytes Relative: 6 % (ref 3.0–12.0)
Neutro Abs: 3.5 10*3/uL (ref 1.4–7.7)
Neutrophils Relative %: 57.7 % (ref 43.0–77.0)
Platelets: 337 10*3/uL (ref 150.0–400.0)
RBC: 4.7 Mil/uL (ref 3.87–5.11)
RDW: 13 % (ref 11.5–15.5)
WBC: 6.2 10*3/uL (ref 4.0–10.5)

## 2023-10-07 LAB — MICROALBUMIN / CREATININE URINE RATIO
Creatinine,U: 18.4 mg/dL
Microalb Creat Ratio: UNDETERMINED mg/g (ref 0.0–30.0)
Microalb, Ur: <0.7 mg/dL

## 2023-10-07 LAB — LIPID PANEL
Cholesterol: 173 mg/dL (ref 0–200)
HDL: 59.8 mg/dL (ref >39.00)
LDL Cholesterol: 86 mg/dL (ref 0–99)
NonHDL: 112.88
Total CHOL/HDL Ratio: 3
Triglycerides: 136 mg/dL (ref 0.0–149.0)
VLDL: 27.2 mg/dL (ref 0.0–40.0)

## 2023-10-07 LAB — HEMOGLOBIN A1C: Hgb A1c MFr Bld: 7.2 % — ABNORMAL HIGH (ref 4.6–6.5)

## 2023-10-07 NOTE — Assessment & Plan Note (Signed)
 Diet and nutrition discussed Benefits of exercise discussed Advised to decrease amount of daily carbohydrate intake and daily calories and increase amount of plant based protein in her diet.

## 2023-10-07 NOTE — Patient Instructions (Signed)
 Health Maintenance After Age 71 After age 4, you are at a higher risk for certain long-term diseases and infections as well as injuries from falls. Falls are a major cause of broken bones and head injuries in people who are older than age 47. Getting regular preventive care can help to keep you healthy and well. Preventive care includes getting regular testing and making lifestyle changes as recommended by your health care provider. Talk with your health care provider about: Which screenings and tests you should have. A screening is a test that checks for a disease when you have no symptoms. A diet and exercise plan that is right for you. What should I know about screenings and tests to prevent falls? Screening and testing are the best ways to find a health problem early. Early diagnosis and treatment give you the best chance of managing medical conditions that are common after age 37. Certain conditions and lifestyle choices may make you more likely to have a fall. Your health care provider may recommend: Regular vision checks. Poor vision and conditions such as cataracts can make you more likely to have a fall. If you wear glasses, make sure to get your prescription updated if your vision changes. Medicine review. Work with your health care provider to regularly review all of the medicines you are taking, including over-the-counter medicines. Ask your health care provider about any side effects that may make you more likely to have a fall. Tell your health care provider if any medicines that you take make you feel dizzy or sleepy. Strength and balance checks. Your health care provider may recommend certain tests to check your strength and balance while standing, walking, or changing positions. Foot health exam. Foot pain and numbness, as well as not wearing proper footwear, can make you more likely to have a fall. Screenings, including: Osteoporosis screening. Osteoporosis is a condition that causes  the bones to get weaker and break more easily. Blood pressure screening. Blood pressure changes and medicines to control blood pressure can make you feel dizzy. Depression screening. You may be more likely to have a fall if you have a fear of falling, feel depressed, or feel unable to do activities that you used to do. Alcohol use screening. Using too much alcohol can affect your balance and may make you more likely to have a fall. Follow these instructions at home: Lifestyle Do not drink alcohol if: Your health care provider tells you not to drink. If you drink alcohol: Limit how much you have to: 0-1 drink a day for women. 0-2 drinks a day for men. Know how much alcohol is in your drink. In the [place] one drink equals one 12 oz bottle of beer (355 mL), one 5 oz glass of wine (148 mL), or one 1 oz glass of hard liquor (44 mL). Do not use any products that contain nicotine or tobacco. These products include cigarettes, chewing tobacco, and vaping devices, such as e-cigarettes. If you need help quitting, ask your health care provider. Activity  Follow a regular exercise program to stay fit. This will help you maintain your balance. Ask your health care provider what types of exercise are appropriate for you. If you need a cane or walker, use it as recommended by your health care provider. Wear supportive shoes that have nonskid soles. Safety  Remove any tripping hazards, such as rugs, cords, and clutter. Install safety equipment such as grab bars in bathrooms and safety rails on stairs. Keep rooms and walkways  well-lit. General instructions Talk with your health care provider about your risks for falling. Tell your health care provider if: You fall. Be sure to tell your health care provider about all falls, even ones that seem minor. You feel dizzy, tiredness (fatigue), or off-balance. Take over-the-counter and prescription medicines only as told by your health care provider. These include  supplements. Eat a healthy diet and maintain a healthy weight. A healthy diet includes low-fat dairy products, low-fat (lean) meats, and fiber from whole grains, beans, and lots of fruits and vegetables. Stay current with your vaccines. Schedule regular health, dental, and eye exams. Summary Having a healthy lifestyle and getting preventive care can help to protect your health and wellness after age 11. Screening and testing are the best way to find a health problem early and help you avoid having a fall. Early diagnosis and treatment give you the best chance for managing medical conditions that are more common for people who are older than age 28. Falls are a major cause of broken bones and head injuries in people who are older than age 48. Take precautions to prevent a fall at home. Work with your health care provider to learn what changes you can make to improve your health and wellness and to prevent falls. This information is not intended to replace advice given to you by your health care provider. Make sure you discuss any questions you have with your health care provider. Document Revised: 09/17/2020 Document Reviewed: 09/17/2020 Elsevier Patient Education  2024 [employer].

## 2023-10-07 NOTE — Assessment & Plan Note (Signed)
Well-controlled.  No concerns. Continues daily Breo Ellipta 1 puff.

## 2023-10-07 NOTE — Progress Notes (Signed)
 Amy [name] 73 y.o.   Chief Complaint  Patient presents with   Follow-up    6 month f/u for DM. Patient mentions she is having right hip surgery in July 1. No other concerns     HISTORY OF PRESENT ILLNESS: This is a 73 y.o. female A1A here for follow-up of chronic medical conditions Scheduled for right hip surgery next July Overall doing well.  Has no complaints or any other medical concerns Lab Results  Component Value Date   HGBA1C 6.9 (A) 04/07/2023   Wt Readings from Last 3 Encounters:  10/06/23 221 lb (100.2 kg)  09/24/23 221 lb 9.6 oz (100.5 kg)  07/05/23 220 lb (99.8 kg)     HPI   Prior to Admission medications   Medication Sig Start Date End Date Taking? Authorizing Provider  acetaminophen  (TYLENOL ) 500 MG tablet Take 1 tablet (500 mg total) by mouth every 6 (six) hours as needed. 07/05/22  Yes Redwine, Madison A, PA-C  aspirin  EC 325 MG tablet Take 325 mg by mouth daily as needed for mild pain.   Yes [provider]  fluticasone  furoate-vilanterol (BREO ELLIPTA ) 200-25 MCG/ACT AEPB TAKE 1 PUFF BY MOUTH EVERY DAY 12/16/22  Yes Obdulia Steier, Isidro Margo, MD  ibuprofen  (ADVIL ) 600 MG tablet Take 1 tablet (600 mg total) by mouth every 6 (six) hours as needed. 07/05/22  Yes Redwine, Madison A, PA-C  metFORMIN  (GLUCOPHAGE ) 1000 MG tablet Take 1 tablet (1,000 mg total) by mouth 2 (two) times daily with a meal. 08/14/22  Yes Tajah Noguchi, Isidro Margo, MD  Multiple Vitamin (MULTIVITAMIN) tablet Take 1 tablet by mouth daily.   Yes [provider]  polyethylene glycol powder (GLYCOLAX/MIRALAX) 17 GM/SCOOP powder Take 17 g by mouth daily as needed (constipation).   Yes [provider]  Continuous Glucose Receiver (FREESTYLE LIBRE 3 READER) DEVI 1 Device by Does not apply route once for 1 dose. Use as directed to monitor patient blood glucose 09/24/22 09/24/22  Aayan Haskew, Isidro Margo, MD  Continuous Glucose Sensor (FREESTYLE LIBRE 3 SENSOR) MISC Apply 1 senor to skin  every 14 days to monitor blood glucose. Use as directed Patient not taking: Reported on 10/07/2023 09/24/22   Elvira Hammersmith, MD  DOCUSATE SODIUM PO Take 2 capsules by mouth daily as needed (constipation). Patient not taking: Reported on 10/06/2023    [provider]  magnesium  citrate SOLN Take 1 Bottle by mouth once as needed for severe constipation. Patient not taking: Reported on 10/06/2023    [provider]    No Known Allergies  Patient Active Problem List   Diagnosis Date Noted   Hemorrhoids 12/18/2020   Abnormal EKG 04/27/2019   Mild intermittent asthma 10/26/2017   Morbid (severe) obesity due to excess calories (HCC) 09/29/2016   Diabetes (HCC) 08/10/2013   History of kidney stones    Colon polyp, hyperplastic 03/29/2010    Past Medical History:  Diagnosis Date   Colon polyp, hyperplastic 03/29/2010   Diabetes mellitus    History of kidney stones    Mild intermittent asthma     Past Surgical History:  Procedure Laterality Date   CYST EXCISION Left 15 years ago   Axillary    ECTOPIC PREGNANCY SURGERY     OVARIAN CYST REMOVAL  35 years ago   TOTAL ABDOMINAL HYSTERECTOMY  1984    Social History   Socioeconomic History   Marital status: Married    Spouse name: Martie Slaughter   Number of children: 2   Years  of education: Not on file   Highest education level: Not on file  Occupational History   Occupation: RETIRED    Employer: [employer]  Tobacco Use   Smoking status: Former    Current packs/day: 0.00    Average packs/day: 0.5 packs/day for 12.0 years (6.0 ttl pk-yrs)    Types: Cigarettes    Start date: 05/12/1968    Quit date: 05/12/1980    Years since quitting: 43.4   Smokeless tobacco: Never  Vaping Use   Vaping status: Never Used  Substance and Sexual Activity   Alcohol use: Yes    Alcohol/week: 0.0 standard drinks of alcohol    Comment: "Birthdays"   Drug use: No   Sexual activity: Yes    Partners: Male    Birth  control/protection: Surgical    Comment: Hysterectomy  Other Topics Concern   Not on file  Social History Narrative   Lives with husband/2025   Social Drivers of Health   Financial Resource Strain: Low Risk  (10/06/2023)   Overall Financial Resource Strain (CARDIA)    Difficulty of Paying Living Expenses: Not very hard  Food Insecurity: No Food Insecurity (10/06/2023)   Hunger Vital Sign    Worried About Running Out of Food in the Last Year: Never true    Ran Out of Food in the Last Year: Never true  Transportation Needs: No Transportation Needs (10/06/2023)   PRAPARE - Administrator, Civil Service (Medical): No    Lack of Transportation (Non-Medical): No  Physical Activity: Inactive (10/06/2023)   Exercise Vital Sign    Days of Exercise per Week: 0 days    Minutes of Exercise per Session: 0 min  Stress: No Stress Concern Present (10/06/2023)   [employer] of Occupational Health - Occupational Stress Questionnaire    Feeling of Stress : Not at all  Social Connections: Socially Integrated (10/06/2023)   Social Connection and Isolation Panel [NHANES]    Frequency of Communication with Friends and Family: Three times a week    Frequency of Social Gatherings with Friends and Family: Once a week    Attends Religious Services: More than 4 times per year    Active Member of [employer] or Organizations: Yes    Attends Banker Meetings: Never    Marital Status: Married  Catering manager Violence: Not At Risk (10/06/2023)   Humiliation, Afraid, Rape, and Kick questionnaire    Fear of Current or Ex-Partner: No    Emotionally Abused: No    Physically Abused: No    Sexually Abused: No    Family History  Problem Relation Age of Onset   Stomach cancer Maternal Grandmother    Heart disease Maternal Grandfather    Heart attack Maternal Grandfather    Diabetes Father    Hypertension Mother    Diabetes Brother    Hypertension Sister    Allergies Sister     Allergies Daughter      Review of Systems  Constitutional: Negative.  Negative for chills and fever.  HENT: Negative.  Negative for congestion and sore throat.   Respiratory: Negative.  Negative for cough and shortness of breath.   Cardiovascular: Negative.  Negative for chest pain and palpitations.  Gastrointestinal:  Negative for abdominal pain, diarrhea, nausea and vomiting.  Genitourinary: Negative.  Negative for dysuria and hematuria.  Musculoskeletal:  Positive for joint pain (Right hip pain).  Skin: Negative.  Negative for rash.  Neurological: Negative.  Negative for dizziness and headaches.  All other systems reviewed and are negative.   Today's Vitals   10/07/23 1005  BP: 130/88  Pulse: 70  Temp: 98.7 F (37.1 C)  TempSrc: Oral  SpO2: 97%  Weight: 224 lb (101.6 kg)  Height: 5\' 6"  (1.676 m)   Body mass index is 36.15 kg/m.   Physical Exam Vitals reviewed.  Constitutional:      Appearance: Normal appearance.  HENT:     Head: Normocephalic.     Mouth/Throat:     Mouth: Mucous membranes are moist.     Pharynx: Oropharynx is clear.  Eyes:     Extraocular Movements: Extraocular movements intact.     Conjunctiva/sclera: Conjunctivae normal.     Pupils: Pupils are equal, round, and reactive to light.  Cardiovascular:     Rate and Rhythm: Normal rate and regular rhythm.     Pulses: Normal pulses.     Heart sounds: Normal heart sounds.  Pulmonary:     Effort: Pulmonary effort is normal.     Breath sounds: Normal breath sounds.  Musculoskeletal:     Cervical back: No tenderness.     Right lower leg: No edema.     Left lower leg: No edema.     Comments: Feet: Warm to touch.  Excellent distal circulation with very good palpable pulses and excellent capillary refill.  No open wounds or sores.  No signs of chronic venous insufficiency.  Lymphadenopathy:     Cervical: No cervical adenopathy.  Skin:    General: Skin is warm and dry.  Neurological:     General: No  focal deficit present.     Mental Status: She is alert and oriented to person, place, and time.  Psychiatric:        Mood and Affect: Mood normal.        Behavior: Behavior normal.      ASSESSMENT & PLAN: A total of 45 minutes was spent with the patient and counseling/coordination of care regarding preparing for this visit, review of most recent office visit notes, review of multiple chronic medical conditions and their management, review of all medications, review of most recent bloodwork results, review of health maintenance items, education on nutrition, prognosis, documentation, and need for follow up.   Problem List Items Addressed This Visit       Respiratory   Mild intermittent asthma   Well-controlled.  No concerns. Continues daily Breo Ellipta  1 puff.        Endocrine   Diabetes (HCC) - Primary   Well-controlled diabetes with hemoglobin A1c of 6.9 Continue metformin  1000 mg twice a day Diet and nutrition discussed Cardiovascular risk associated with diabetes discussed Follow-up in 6 months      Relevant Orders   Hemoglobin A1c   CBC with Differential/Platelet   Comprehensive metabolic panel with GFR   Lipid panel   Microalbumin / creatinine urine ratio     Other   Morbid (severe) obesity due to excess calories (HCC)   Diet and nutrition discussed Benefits of exercise discussed Advised to decrease amount of daily carbohydrate intake and daily calories and increase amount of plant-based protein in her diet.      Other Visit Diagnoses       Need for hepatitis C screening test          Patient Instructions  Health Maintenance After Age 68 After age 49, you are at a higher risk for certain long-term diseases and infections as well as injuries from falls. Falls are a  major cause of broken bones and head injuries in people who are older than age 60. Getting regular preventive care can help to keep you healthy and well. Preventive care includes getting regular  testing and making lifestyle changes as recommended by your health care provider. Talk with your health care provider about: Which screenings and tests you should have. A screening is a test that checks for a disease when you have no symptoms. A diet and exercise plan that is right for you. What should I know about screenings and tests to prevent falls? Screening and testing are the best ways to find a health problem early. Early diagnosis and treatment give you the best chance of managing medical conditions that are common after age 8. Certain conditions and lifestyle choices may make you more likely to have a fall. Your health care provider may recommend: Regular vision checks. Poor vision and conditions such as cataracts can make you more likely to have a fall. If you wear glasses, make sure to get your prescription updated if your vision changes. Medicine review. Work with your health care provider to regularly review all of the medicines you are taking, including over-the-counter medicines. Ask your health care provider about any side effects that may make you more likely to have a fall. Tell your health care provider if any medicines that you take make you feel dizzy or sleepy. Strength and balance checks. Your health care provider may recommend certain tests to check your strength and balance while standing, walking, or changing positions. Foot health exam. Foot pain and numbness, as well as not wearing proper footwear, can make you more likely to have a fall. Screenings, including: Osteoporosis screening. Osteoporosis is a condition that causes the bones to get weaker and break more easily. Blood pressure screening. Blood pressure changes and medicines to control blood pressure can make you feel dizzy. Depression screening. You may be more likely to have a fall if you have a fear of falling, feel depressed, or feel unable to do activities that you used to do. Alcohol use screening. Using too  much alcohol can affect your balance and may make you more likely to have a fall. Follow these instructions at home: Lifestyle Do not drink alcohol if: Your health care provider tells you not to drink. If you drink alcohol: Limit how much you have to: 0-1 drink a day for women. 0-2 drinks a day for men. Know how much alcohol is in your drink. In the [place] one drink equals one 12 oz bottle of beer (355 mL), one 5 oz glass of wine (148 mL), or one 1 oz glass of hard liquor (44 mL). Do not use any products that contain nicotine or tobacco. These products include cigarettes, chewing tobacco, and vaping devices, such as e-cigarettes. If you need help quitting, ask your health care provider. Activity  Follow a regular exercise program to stay fit. This will help you maintain your balance. Ask your health care provider what types of exercise are appropriate for you. If you need a cane or walker, use it as recommended by your health care provider. Wear supportive shoes that have nonskid soles. Safety  Remove any tripping hazards, such as rugs, cords, and clutter. Install safety equipment such as grab bars in bathrooms and safety rails on stairs. Keep rooms and walkways well-lit. General instructions Talk with your health care provider about your risks for falling. Tell your health care provider if: You fall. Be sure to tell your health  care provider about all falls, even ones that seem minor. You feel dizzy, tiredness (fatigue), or off-balance. Take over-the-counter and prescription medicines only as told by your health care provider. These include supplements. Eat a healthy diet and maintain a healthy weight. A healthy diet includes low-fat dairy products, low-fat (lean) meats, and fiber from whole grains, beans, and lots of fruits and vegetables. Stay current with your vaccines. Schedule regular health, dental, and eye exams. Summary Having a healthy lifestyle and getting preventive care can  help to protect your health and wellness after age 32. Screening and testing are the best way to find a health problem early and help you avoid having a fall. Early diagnosis and treatment give you the best chance for managing medical conditions that are more common for people who are older than age 20. Falls are a major cause of broken bones and head injuries in people who are older than age 29. Take precautions to prevent a fall at home. Work with your health care provider to learn what changes you can make to improve your health and wellness and to prevent falls. This information is not intended to replace advice given to you by your health care provider. Make sure you discuss any questions you have with your health care provider. Document Revised: 09/17/2020 Document Reviewed: 09/17/2020 Elsevier Patient Education  2024 Elsevier Inc.      Maryagnes Small, MD [facility]

## 2023-10-07 NOTE — Assessment & Plan Note (Signed)
 Well-controlled diabetes with hemoglobin A1c of 6.9 Continue metformin 1000 mg twice a day Diet and nutrition discussed Cardiovascular risk associated with diabetes discussed Follow-up in 6 months

## 2023-10-08 LAB — HEPATITIS C ANTIBODY: Hepatitis C Ab: NONREACTIVE

## 2023-10-13 ENCOUNTER — Telehealth: Payer: Self-pay | Admitting: Orthopaedic Surgery

## 2023-10-13 NOTE — Telephone Encounter (Signed)
 Pt requesting a phone call in regards to concerns about if she should take medication that's being prescribed by another provider before her surgery . Best call back number [phone]

## 2023-10-13 NOTE — Telephone Encounter (Signed)
 Called patient. She is scheduled to see her dentist about tooth infection on 6/10. She will contact us  after that appointment, she has not started any antibiotics yet.

## 2023-10-14 ENCOUNTER — Ambulatory Visit (INDEPENDENT_AMBULATORY_CARE_PROVIDER_SITE_OTHER)
Admission: RE | Admit: 2023-10-14 | Discharge: 2023-10-14 | Disposition: A | Discharge status: Home / Self Care | Source: Ambulatory Visit | Attending: Emergency Medicine | Admitting: Emergency Medicine

## 2023-10-14 ENCOUNTER — Other Ambulatory Visit: Payer: Self-pay | Admitting: Emergency Medicine

## 2023-10-14 DIAGNOSIS — Z780 Asymptomatic menopausal state: Secondary | ICD-10-CM | POA: Diagnosis not present

## 2023-10-14 DIAGNOSIS — E1165 Type 2 diabetes mellitus with hyperglycemia: Secondary | ICD-10-CM

## 2023-10-20 ENCOUNTER — Ambulatory Visit: Payer: Self-pay | Admitting: Emergency Medicine

## 2023-11-02 NOTE — Pre-Procedure Instructions (Signed)
 Surgical Instructions   Your procedure is scheduled on November 10, 2023. Report to [facility] Main Entrance A at 8:15 A.M., then check in with the Admitting office. Any questions or running late day of surgery: call [phone]  Questions prior to your surgery date: call [phone], Monday-Friday, 8am-4pm. If you experience any cold or flu symptoms such as cough, fever, chills, shortness of breath, etc. between now and your scheduled surgery, please notify us  at the above number.     Remember:  Do not eat after midnight the night before your surgery  You may drink clear liquids until 7:15 AM the morning of your surgery.   Clear liquids allowed are: Water, Non-Citrus Juices (without pulp), Carbonated Beverages, Clear Tea (no milk, honey, etc.), Black Coffee Only (NO MILK, CREAM OR POWDERED CREAMER of any kind), and Gatorade.  Patient Instructions  The night before surgery:  No food after midnight. ONLY clear liquids after midnight  The day of surgery (if you have diabetes): Drink ONE (1) 12 oz G2 given to you in your pre admission testing appointment by 7:15 AM the morning of surgery. Drink in one sitting. Do not sip.  This drink was given to you during your hospital  pre-op appointment visit.  Nothing else to drink after completing the  12 oz bottle of G2.         If you have questions, please contact your surgeon's office.   Take these medicines the morning of surgery with A SIP OF WATER: amoxicillin (AMOXIL)  fluticasone  furoate-vilanterol (BREO ELLIPTA )    May take these medicines IF NEEDED: acetaminophen  (TYLENOL )  albuterol  (VENTOLIN  HFA) inhaler - please bring inhaler with you morning of surgery   One week prior to surgery, STOP taking any Aspirin  (unless otherwise instructed by your surgeon) Aleve, Naproxen, Ibuprofen , Motrin , Advil , Goody's, BC's, all herbal medications, fish oil, and non-prescription vitamins.   WHAT DO I DO ABOUT MY DIABETES MEDICATION?   Do  not take metFORMIN  (GLUCOPHAGE ) the morning of surgery.   HOW TO MANAGE YOUR DIABETES BEFORE AND AFTER SURGERY  Why is it important to control my blood sugar before and after surgery? Improving blood sugar levels before and after surgery helps healing and can limit problems. A way of improving blood sugar control is eating a healthy diet by:  Eating less sugar and carbohydrates  Increasing activity/exercise  Talking with your doctor about reaching your blood sugar goals High blood sugars (greater than 180 mg/dL) can raise your risk of infections and slow your recovery, so you will need to focus on controlling your diabetes during the weeks before surgery. Make sure that the doctor who takes care of your diabetes knows about your planned surgery including the date and location.  How do I manage my blood sugar before surgery? Check your blood sugar at least 4 times a day, starting 2 days before surgery, to make sure that the level is not too high or low.  Check your blood sugar the morning of your surgery when you wake up and every 2 hours until you get to the Short Stay unit.  If your blood sugar is less than 70 mg/dL, you will need to treat for low blood sugar: Do not take insulin. Treat a low blood sugar (less than 70 mg/dL) with  cup of clear juice (cranberry or apple), 4 glucose tablets, OR glucose gel. Recheck blood sugar in 15 minutes after treatment (to make sure it is greater than 70 mg/dL). If your blood sugar  is not greater than 70 mg/dL on recheck, call [phone] for further instructions. Report your blood sugar to the short stay nurse when you get to Short Stay.  If you are admitted to the hospital after surgery: Your blood sugar will be checked by the staff and you will probably be given insulin after surgery (instead of oral diabetes medicines) to make sure you have good blood sugar levels. The goal for blood sugar control after surgery is 80-180 mg/dL.                       Do NOT Smoke (Tobacco/Vaping) for 24 hours prior to your procedure.  If you use a CPAP at night, you may bring your mask/headgear for your overnight stay.   You will be asked to remove any contacts, glasses, piercing's, hearing aid's, dentures/partials prior to surgery. Please bring cases for these items if needed.    Patients discharged the day of surgery will not be allowed to drive home, and someone needs to stay with them for 24 hours.  SURGICAL WAITING ROOM VISITATION Patients may have no more than 2 support people in the waiting area - these visitors may rotate.   Pre-op nurse will coordinate an appropriate time for 1 ADULT support person, who may not rotate, to accompany patient in pre-op.  Children under the age of 55 must have an adult with them who is not the patient and must remain in the main waiting area with an adult.  If the patient needs to stay at the hospital during part of their recovery, the visitor guidelines for inpatient rooms apply.  Please refer to the [facility] website for the visitor guidelines for any additional information.   If you received a COVID test during your pre-op visit  it is requested that you wear a mask when out in public, stay away from anyone that may not be feeling well and notify your surgeon if you develop symptoms. If you have been in contact with anyone that has tested positive in the last 10 days please notify you surgeon.      Pre-operative 5 CHG Bathing Instructions   You can play a key role in reducing the risk of infection after surgery. Your skin needs to be as free of germs as possible. You can reduce the number of germs on your skin by washing with CHG (chlorhexidine gluconate) soap before surgery. CHG is an antiseptic soap that kills germs and continues to kill germs even after washing.   DO NOT use if you have an allergy to chlorhexidine/CHG or antibacterial soaps. If your skin becomes reddened or irritated, stop using the  CHG and notify one of our RNs at [phone].   Please shower with the CHG soap starting 4 days before surgery using the following schedule:     Please keep in mind the following:  DO NOT shave, including legs and underarms, starting the day of your first shower.   You may shave your face at any point before/day of surgery.  Place clean sheets on your bed the day you start using CHG soap. Use a clean washcloth (not used since being washed) for each shower. DO NOT sleep with pets once you start using the CHG.   CHG Shower Instructions:  Wash your face and private area with normal soap. If you choose to wash your hair, wash first with your normal shampoo.  After you use shampoo/soap, rinse your hair and body thoroughly to remove  shampoo/soap residue.  Turn the water OFF and apply about 3 tablespoons (45 ml) of CHG soap to a CLEAN washcloth.  Apply CHG soap ONLY FROM YOUR NECK DOWN TO YOUR TOES (washing for 3-5 minutes)  DO NOT use CHG soap on face, private areas, open wounds, or sores.  Pay special attention to the area where your surgery is being performed.  If you are having back surgery, having someone wash your back for you may be helpful. Wait 2 minutes after CHG soap is applied, then you may rinse off the CHG soap.  Pat dry with a clean towel  Put on clean clothes/pajamas   If you choose to wear lotion, please use ONLY the CHG-compatible lotions that are listed below.  Additional instructions for the day of surgery: DO NOT APPLY any lotions, deodorants, cologne, or perfumes.   Do not bring valuables to the hospital. [facility] is not responsible for any belongings/valuables. Do not wear nail polish, gel polish, artificial nails, or any other type of covering on natural nails (fingers and toes) Do not wear jewelry or makeup Put on clean/comfortable clothes.  Please brush your teeth.  Ask your nurse before applying any prescription medications to the skin.     CHG Compatible  Lotions   Aveeno Moisturizing lotion  Cetaphil Moisturizing Cream  Cetaphil Moisturizing Lotion  Clairol Herbal Essence Moisturizing Lotion, Dry Skin  Clairol Herbal Essence Moisturizing Lotion, Extra Dry Skin  Clairol Herbal Essence Moisturizing Lotion, Normal Skin  Curel Age Defying Therapeutic Moisturizing Lotion with Alpha Hydroxy  Curel Extreme Care Body Lotion  Curel Soothing Hands Moisturizing Hand Lotion  Curel Therapeutic Moisturizing Cream, Fragrance-Free  Curel Therapeutic Moisturizing Lotion, Fragrance-Free  Curel Therapeutic Moisturizing Lotion, Original Formula  Eucerin Daily Replenishing Lotion  Eucerin Dry Skin Therapy Plus Alpha Hydroxy Crme  Eucerin Dry Skin Therapy Plus Alpha Hydroxy Lotion  Eucerin Original Crme  Eucerin Original Lotion  Eucerin Plus Crme Eucerin Plus Lotion  Eucerin TriLipid Replenishing Lotion  Keri Anti-Bacterial Hand Lotion  Keri Deep Conditioning Original Lotion Dry Skin Formula Softly Scented  Keri Deep Conditioning Original Lotion, Fragrance Free Sensitive Skin Formula  Keri Lotion Fast Absorbing Fragrance Free Sensitive Skin Formula  Keri Lotion Fast Absorbing Softly Scented Dry Skin Formula  Keri Original Lotion  Keri Skin Renewal Lotion Keri Silky Smooth Lotion  Keri Silky Smooth Sensitive Skin Lotion  Nivea Body Creamy Conditioning Oil  Nivea Body Extra Enriched Lotion  Nivea Body Original Lotion  Nivea Body Sheer Moisturizing Lotion Nivea Crme  Nivea Skin Firming Lotion  NutraDerm 30 Skin Lotion  NutraDerm Skin Lotion  NutraDerm Therapeutic Skin Cream  NutraDerm Therapeutic Skin Lotion  ProShield Protective Hand Cream  Provon moisturizing lotion  Please read over the following fact sheets that you were given.

## 2023-11-03 ENCOUNTER — Other Ambulatory Visit: Payer: Self-pay

## 2023-11-03 ENCOUNTER — Encounter (HOSPITAL_COMMUNITY)
Admission: RE | Admit: 2023-11-03 | Discharge: 2023-11-03 | Disposition: A | Discharge status: Home / Self Care | Source: Ambulatory Visit | Attending: Orthopaedic Surgery | Admitting: Orthopaedic Surgery

## 2023-11-03 ENCOUNTER — Encounter (HOSPITAL_COMMUNITY): Payer: Self-pay

## 2023-11-03 VITALS — BP 134/66 | HR 79 | Temp 98.5°F | Resp 20 | Ht 66.0 in | Wt 224.2 lb

## 2023-11-03 DIAGNOSIS — E119 Type 2 diabetes mellitus without complications: Secondary | ICD-10-CM | POA: Insufficient documentation

## 2023-11-03 DIAGNOSIS — Z01818 Encounter for other preprocedural examination: Secondary | ICD-10-CM | POA: Insufficient documentation

## 2023-11-03 HISTORY — DX: Unspecified osteoarthritis, unspecified site: M19.90

## 2023-11-03 LAB — COMPREHENSIVE METABOLIC PANEL WITH GFR
ALT: 21 U/L (ref 0–44)
AST: 26 U/L (ref 15–41)
Albumin: 3.6 g/dL (ref 3.5–5.0)
Alkaline Phosphatase: 64 U/L (ref 38–126)
Anion gap: 10 (ref 5–15)
BUN: 6 mg/dL — ABNORMAL LOW (ref 8–23)
CO2: 27 mmol/L (ref 22–32)
Calcium: 9.6 mg/dL (ref 8.9–10.3)
Chloride: 102 mmol/L (ref 98–111)
Creatinine, Ser: 0.55 mg/dL (ref 0.44–1.00)
GFR, Estimated: >60 mL/min (ref >60)
Glucose, Bld: 113 mg/dL — ABNORMAL HIGH (ref 70–99)
Potassium: 4.3 mmol/L (ref 3.5–5.1)
Sodium: 139 mmol/L (ref 135–145)
Total Bilirubin: 0.6 mg/dL (ref 0.0–1.2)
Total Protein: 7.4 g/dL (ref 6.5–8.1)

## 2023-11-03 LAB — SURGICAL PCR SCREEN
MRSA, PCR: NEGATIVE
Staphylococcus aureus: NEGATIVE

## 2023-11-03 LAB — TYPE AND SCREEN
ABO/RH(D): A POS
Antibody Screen: NEGATIVE

## 2023-11-03 LAB — GLUCOSE, CAPILLARY: Glucose-Capillary: 116 mg/dL — ABNORMAL HIGH (ref 70–99)

## 2023-11-03 LAB — CBC
HCT: 42.4 % (ref 36.0–46.0)
Hemoglobin: 13.9 g/dL (ref 12.0–15.0)
MCH: 29.2 pg (ref 26.0–34.0)
MCHC: 32.8 g/dL (ref 30.0–36.0)
MCV: 89.1 fL (ref 80.0–100.0)
Platelets: 311 10*3/uL (ref 150–400)
RBC: 4.76 MIL/uL (ref 3.87–5.11)
RDW: 12.4 % (ref 11.5–15.5)
WBC: 6.8 10*3/uL (ref 4.0–10.5)
nRBC: 0 % (ref 0.0–0.2)

## 2023-11-03 NOTE — Progress Notes (Signed)
 PCP - Dr. Emil Schanz Sagardia Cardiologist - Pt saw Dr. Youlanda Juba in 2021 and 2022 for CP/Dyspnea. All work-up normal and PRN follow-up.  PPM/ICD - Denies Device Orders - n/a Rep Notified - n/a  Chest x-ray - n/a EKG - 11/03/2023 Stress Test - 10/27/2019 (CE) ECHO - 10/27/2019 (CE) Cardiac Cath - Denies  Sleep Study - Denies CPAP - n/a  Pt is DM2. She checks her blood sugar about 2x/week. She does not know her normal fasting blood sugar. CBG at ppre-op appointment 116. Last A1c 7.2 on 10/07/2023  Last dose of GLP1 agonist- n/a GLP1 instructions: n/a  Blood Thinner Instructions: n/a Aspirin  Instructions: n/a  ERAS Protcol - Clear liquids until 0715 morning of surgery PRE-SURGERY Ensure or G2- G2 given to pt with instructions  COVID TEST- n/a   Anesthesia review: No. To note, pt was recently on amoxicillin after having a tooth pulled June 17th due to infection. Pt will complete antibiotic today, June 24th. She denies any symptoms of infection from the pulled tooth.  Patient denies shortness of breath, fever, cough and chest pain at PAT appointment. Pt denies any respiratory illness/infection in the last two months.   All instructions explained to the patient, with a verbal understanding of the material. Patient agrees to go over the instructions while at home for a better understanding. Patient also instructed to self quarantine after being tested for COVID-19. The opportunity to ask questions was provided.

## 2023-11-04 ENCOUNTER — Telehealth: Payer: Self-pay | Admitting: *Deleted

## 2023-11-04 NOTE — Telephone Encounter (Signed)
 [facility] RNCM pre-op call to discuss upcoming Right THA.

## 2023-11-04 NOTE — Care Plan (Signed)
 [facility] RNCM call to patient to discuss her upcoming Right total hip arthroplasty with Dr. Vernetta on 11/10/23 at [facility]. She is agreeable to case management. She lives with her spouse, who will be assisting after discharge to home, along with her daughter. She will need a RW. Anticipate HHPT will be needed after a short hospital stay. Referral made to [facility]. This is pending at this time. Reviewed post op care instructions and will continue to follow for needs.

## 2023-11-05 DIAGNOSIS — M1611 Unilateral primary osteoarthritis, right hip: Secondary | ICD-10-CM | POA: Diagnosis not present

## 2023-11-09 DIAGNOSIS — M1611 Unilateral primary osteoarthritis, right hip: Secondary | ICD-10-CM | POA: Insufficient documentation

## 2023-11-09 NOTE — H&P (Signed)
 TOTAL HIP ADMISSION H&P  Patient is admitted for right total hip arthroplasty.  Subjective:  Chief Complaint: right hip pain  HPI: Amy [name], 73 y.o. female, has a history of pain and functional disability in the right hip(s) due to arthritis and patient has failed non-surgical conservative treatments for greater than 12 weeks to include NSAID's and/or analgesics, use of assistive devices, and weight reduction as appropriate.  Onset of symptoms was  starting several months ago with rapidlly worsening course since that time.The patient noted no past surgery on the right hip(s).  Patient currently rates pain in the right hip at 10 out of 10 with activity. Patient has night pain, worsening of pain with activity and weight bearing, trendelenberg gait, pain that interfers with activities of daily living, and pain with passive range of motion. Patient has evidence of subchondral sclerosis, periarticular osteophytes, joint space narrowing, and extensive subchondral edema by imaging studies. This condition presents safety issues increasing the risk of falls.  There is no current active infection.  Patient Active Problem List   Diagnosis Date Noted   Unilateral primary osteoarthritis, right hip 11/09/2023   Hemorrhoids 12/18/2020   Abnormal EKG 04/27/2019   Mild intermittent asthma 10/26/2017   Morbid (severe) obesity due to excess calories (HCC) 09/29/2016   Diabetes (HCC) 08/10/2013   History of kidney stones    Colon polyp, hyperplastic 03/29/2010   Past Medical History:  Diagnosis Date   Arthritis    Right Hip   Colon polyp, hyperplastic 03/29/2010   Diabetes mellitus    History of kidney stones    Mild intermittent asthma    Pt denies    Past Surgical History:  Procedure Laterality Date   COLONOSCOPY     CYST EXCISION Left 15 years ago   Axillary    ECTOPIC PREGNANCY SURGERY     OVARIAN CYST REMOVAL  35 years ago   TOTAL ABDOMINAL HYSTERECTOMY  1984    No current  facility-administered medications for this encounter.   Current Outpatient Medications  Medication Sig Dispense Refill Last Dose/Taking   acetaminophen  (TYLENOL ) 500 MG tablet Take 1 tablet (500 mg total) by mouth every 6 (six) hours as needed. 30 tablet 0 Taking As Needed   albuterol  (VENTOLIN  HFA) 108 (90 Base) MCG/ACT inhaler Inhale 1-2 puffs into the lungs every 6 (six) hours as needed (asthma).   Taking As Needed   amoxicillin (AMOXIL) 500 MG capsule Take 500 mg by mouth 4 (four) times daily.   Taking   Hydrocortisone , Perianal, (PREPARATION H) 1 % CREA Place 1 Applicatorful rectally daily as needed (hemorrhoids).   Taking As Needed   ibuprofen  (ADVIL ) 600 MG tablet Take 1 tablet (600 mg total) by mouth every 6 (six) hours as needed. (Patient taking differently: Take 220-440 mg by mouth every 6 (six) hours as needed for mild pain (pain score 1-3) or moderate pain (pain score 4-6).) 30 tablet 0 Taking Differently   metFORMIN  (GLUCOPHAGE ) 1000 MG tablet TAKE 1 TABLET (1,000 MG TOTAL) BY MOUTH TWICE A DAY WITH FOOD (Patient taking differently: Take 1,000 mg by mouth daily with breakfast.) 180 tablet 3 Taking Differently   polyethylene glycol powder (GLYCOLAX/MIRALAX) 17 GM/SCOOP powder Take 17 g by mouth daily as needed (constipation).   Taking As Needed   Continuous Glucose Receiver (FREESTYLE LIBRE 3 READER) DEVI 1 Device by Does not apply route once for 1 dose. Use as directed to monitor patient blood glucose 1 each 0    Continuous Glucose Sensor (  FREESTYLE LIBRE 3 SENSOR) MISC Apply 1 senor to skin every 14 days to monitor blood glucose. Use as directed (Patient not taking: Reported on 10/06/2023) 1 each 5    fluticasone  furoate-vilanterol (BREO ELLIPTA ) 200-25 MCG/ACT AEPB TAKE 1 PUFF BY MOUTH EVERY DAY 60 each 3    magnesium  citrate SOLN Take 1 Bottle by mouth once as needed for severe constipation. (Patient not taking: Reported on 10/06/2023)   Not Taking   No Known Allergies  Social History    Tobacco Use   Smoking status: Former    Current packs/day: 0.00    Average packs/day: 0.5 packs/day for 12.0 years (6.0 ttl pk-yrs)    Types: Cigarettes    Start date: 05/12/1968    Quit date: 05/12/1980    Years since quitting: 43.5   Smokeless tobacco: Never  Substance Use Topics   Alcohol use: Yes    Comment: socially    Family History  Problem Relation Age of Onset   Stomach cancer Maternal Grandmother    Heart disease Maternal Grandfather    Heart attack Maternal Grandfather    Diabetes Father    Hypertension Mother    Diabetes Brother    Hypertension Sister    Allergies Sister    Allergies Daughter      Review of Systems  Objective:  Physical Exam Vitals reviewed.  Constitutional:      Appearance: Normal appearance. She is obese.  HENT:     Head: Normocephalic and atraumatic.   Eyes:     Extraocular Movements: Extraocular movements intact.     Pupils: Pupils are equal, round, and reactive to light.    Cardiovascular:     Rate and Rhythm: Normal rate and regular rhythm.  Pulmonary:     Effort: Pulmonary effort is normal.  Abdominal:     Palpations: Abdomen is soft.   Musculoskeletal:     Cervical back: Normal range of motion and neck supple.     Right hip: Tenderness and bony tenderness present. Decreased range of motion. Decreased strength.   Neurological:     Mental Status: She is alert and oriented to person, place, and time.   Psychiatric:        Behavior: Behavior normal.     Vital signs in last 24 hours:    Labs:   Estimated body mass index is 36.19 kg/m as calculated from the following:   Height as of 11/03/23: 5' 6 (1.676 m).   Weight as of 11/03/23: 101.7 kg.   Imaging Review Plain radiographs and MRI demonstrate severe degenerative joint disease of the right hip(s). The bone quality appears to be good for age and reported activity level.      Assessment/Plan:  End stage arthritis, right hip(s)  The patient history,  physical examination, clinical judgement of the provider and imaging studies are consistent with end stage degenerative joint disease of the right hip(s) and total hip arthroplasty is deemed medically necessary. The treatment options including medical management, injection therapy, arthroscopy and arthroplasty were discussed at length. The risks and benefits of total hip arthroplasty were presented and reviewed. The risks due to aseptic loosening, infection, stiffness, dislocation/subluxation,  thromboembolic complications and other imponderables were discussed.  The patient acknowledged the explanation, agreed to proceed with the plan and consent was signed. Patient is being admitted for inpatient treatment for surgery, pain control, PT, OT, prophylactic antibiotics, VTE prophylaxis, progressive ambulation and ADL's and discharge planning.The patient is planning to be discharged home with home health services

## 2023-11-10 ENCOUNTER — Ambulatory Visit (HOSPITAL_COMMUNITY)

## 2023-11-10 ENCOUNTER — Encounter (HOSPITAL_COMMUNITY)
Admission: RE | Disposition: A | Discharge status: Home / Self Care | Payer: Self-pay | Source: Ambulatory Visit | Attending: Orthopaedic Surgery

## 2023-11-10 ENCOUNTER — Observation Stay (HOSPITAL_COMMUNITY)
Admission: RE | Admit: 2023-11-10 | Discharge: 2023-11-11 | Disposition: A | Discharge status: Home / Self Care | Source: Ambulatory Visit | Attending: Orthopaedic Surgery | Admitting: Orthopaedic Surgery

## 2023-11-10 ENCOUNTER — Observation Stay (HOSPITAL_COMMUNITY)

## 2023-11-10 ENCOUNTER — Other Ambulatory Visit: Payer: Self-pay

## 2023-11-10 DIAGNOSIS — F1090 Alcohol use, unspecified, uncomplicated: Secondary | ICD-10-CM | POA: Insufficient documentation

## 2023-11-10 DIAGNOSIS — Z7984 Long term (current) use of oral hypoglycemic drugs: Secondary | ICD-10-CM | POA: Diagnosis not present

## 2023-11-10 DIAGNOSIS — M1611 Unilateral primary osteoarthritis, right hip: Secondary | ICD-10-CM | POA: Diagnosis not present

## 2023-11-10 DIAGNOSIS — Z7901 Long term (current) use of anticoagulants: Secondary | ICD-10-CM | POA: Insufficient documentation

## 2023-11-10 DIAGNOSIS — E119 Type 2 diabetes mellitus without complications: Secondary | ICD-10-CM

## 2023-11-10 DIAGNOSIS — J45909 Unspecified asthma, uncomplicated: Secondary | ICD-10-CM | POA: Diagnosis not present

## 2023-11-10 DIAGNOSIS — Z87891 Personal history of nicotine dependence: Secondary | ICD-10-CM | POA: Insufficient documentation

## 2023-11-10 DIAGNOSIS — Z96641 Presence of right artificial hip joint: Secondary | ICD-10-CM | POA: Insufficient documentation

## 2023-11-10 DIAGNOSIS — Z01818 Encounter for other preprocedural examination: Principal | ICD-10-CM

## 2023-11-10 DIAGNOSIS — Z471 Aftercare following joint replacement surgery: Secondary | ICD-10-CM | POA: Diagnosis not present

## 2023-11-10 DIAGNOSIS — M25551 Pain in right hip: Secondary | ICD-10-CM | POA: Diagnosis present

## 2023-11-10 HISTORY — PX: TOTAL HIP ARTHROPLASTY: SHX124

## 2023-11-10 LAB — GLUCOSE, CAPILLARY
Glucose-Capillary: 168 mg/dL — ABNORMAL HIGH (ref 70–99)
Glucose-Capillary: 146 mg/dL — ABNORMAL HIGH (ref 70–99)
Glucose-Capillary: 140 mg/dL — ABNORMAL HIGH (ref 70–99)
Glucose-Capillary: 177 mg/dL — ABNORMAL HIGH (ref 70–99)
Glucose-Capillary: 159 mg/dL — ABNORMAL HIGH (ref 70–99)

## 2023-11-10 LAB — ABO/RH: ABO/RH(D): A POS

## 2023-11-10 SURGERY — ARTHROPLASTY, HIP, TOTAL, ANTERIOR APPROACH
Anesthesia: Spinal | Site: Hip | Laterality: Right

## 2023-11-10 MED ORDER — ALUM & MAG HYDROXIDE-SIMETH 200-200-20 MG/5ML PO SUSP: 30.0000 mL | ORAL | Status: DC | PRN

## 2023-11-10 MED ORDER — MIDAZOLAM HCL 2 MG/2ML IJ SOLN
INTRAMUSCULAR | Status: AC
  Filled 2023-11-10: qty 2

## 2023-11-10 MED ORDER — SODIUM CHLORIDE 0.9 % IV SOLN: INTRAVENOUS | Status: DC

## 2023-11-10 MED ORDER — METFORMIN HCL 500 MG PO TABS
1000.0000 mg | ORAL_TABLET | Freq: Every day | ORAL | Status: DC
  Administered 2023-11-11: 1000 mg via ORAL
  Filled 2023-11-10: qty 2

## 2023-11-10 MED ORDER — FENTANYL CITRATE (PF) 250 MCG/5ML IJ SOLN
INTRAMUSCULAR | Status: DC | PRN
  Administered 2023-11-10: 50 ug via INTRAVENOUS
  Administered 2023-11-10 (×2): 25 ug via INTRAVENOUS

## 2023-11-10 MED ORDER — PHENYLEPHRINE HCL-NACL 20-0.9 MG/250ML-% IV SOLN
INTRAVENOUS | Status: DC | PRN
  Administered 2023-11-10: 20 ug/min via INTRAVENOUS

## 2023-11-10 MED ORDER — DEXAMETHASONE SODIUM PHOSPHATE 10 MG/ML IJ SOLN
INTRAMUSCULAR | Status: DC | PRN
  Administered 2023-11-10: 5 mg via INTRAVENOUS

## 2023-11-10 MED ORDER — DIPHENHYDRAMINE HCL 12.5 MG/5ML PO ELIX: 12.5000 mg | ORAL_SOLUTION | ORAL | Status: DC | PRN

## 2023-11-10 MED ORDER — OXYCODONE HCL 5 MG PO TABS
5.0000 mg | ORAL_TABLET | ORAL | Status: DC | PRN
  Filled 2023-11-10 (×2): qty 2

## 2023-11-10 MED ORDER — PHENOL 1.4 % MT LIQD: 1.0000 | OROMUCOSAL | Status: DC | PRN

## 2023-11-10 MED ORDER — FLUTICASONE FUROATE-VILANTEROL 200-25 MCG/ACT IN AEPB
1.0000 | INHALATION_SPRAY | Freq: Every day | RESPIRATORY_TRACT | Status: DC
  Administered 2023-11-11: 1 via RESPIRATORY_TRACT
  Filled 2023-11-10: qty 28

## 2023-11-10 MED ORDER — INSULIN ASPART 100 UNIT/ML IJ SOLN: 0.0000 [IU] | INTRAMUSCULAR | Status: DC | PRN

## 2023-11-10 MED ORDER — POLYETHYLENE GLYCOL 3350 17 G PO PACK: 17.0000 g | PACK | Freq: Every day | ORAL | Status: DC | PRN

## 2023-11-10 MED ORDER — LACTATED RINGERS IV SOLN: INTRAVENOUS | Status: DC

## 2023-11-10 MED ORDER — ORAL CARE MOUTH RINSE: 15.0000 mL | Freq: Once | OROMUCOSAL | Status: AC

## 2023-11-10 MED ORDER — FENTANYL CITRATE (PF) 250 MCG/5ML IJ SOLN
INTRAMUSCULAR | Status: AC
  Filled 2023-11-10: qty 5

## 2023-11-10 MED ORDER — METHOCARBAMOL 500 MG PO TABS
500.0000 mg | ORAL_TABLET | Freq: Four times a day (QID) | ORAL | Status: DC | PRN
  Administered 2023-11-10 – 2023-11-11 (×3): 500 mg via ORAL
  Filled 2023-11-10 (×3): qty 1

## 2023-11-10 MED ORDER — SODIUM CHLORIDE 0.9 % IR SOLN
Status: DC | PRN
  Administered 2023-11-10: 1000 mL

## 2023-11-10 MED ORDER — PANTOPRAZOLE SODIUM 40 MG PO TBEC
40.0000 mg | DELAYED_RELEASE_TABLET | Freq: Every day | ORAL | Status: DC
Start: 2023-11-10 — End: 2023-11-11
  Administered 2023-11-10 – 2023-11-11 (×2): 40 mg via ORAL
  Filled 2023-11-10 (×2): qty 1

## 2023-11-10 MED ORDER — ONDANSETRON HCL 4 MG/2ML IJ SOLN
INTRAMUSCULAR | Status: AC
Start: 2023-11-10 — End: 2023-11-10
  Filled 2023-11-10: qty 2

## 2023-11-10 MED ORDER — HYDROMORPHONE HCL 1 MG/ML IJ SOLN
0.2500 mg | INTRAMUSCULAR | Status: DC | PRN
  Administered 2023-11-10 (×2): 0.5 mg via INTRAVENOUS

## 2023-11-10 MED ORDER — 0.9 % SODIUM CHLORIDE (POUR BTL) OPTIME
TOPICAL | Status: DC | PRN
Start: 2023-11-10 — End: 2023-11-10
  Administered 2023-11-10: 1000 mL

## 2023-11-10 MED ORDER — BUPIVACAINE IN DEXTROSE 0.75-8.25 % IT SOLN
INTRATHECAL | Status: DC | PRN
  Administered 2023-11-10: 2 mL via INTRATHECAL

## 2023-11-10 MED ORDER — METOCLOPRAMIDE HCL 5 MG/ML IJ SOLN
5.0000 mg | Freq: Three times a day (TID) | INTRAMUSCULAR | Status: DC | PRN
  Administered 2023-11-10: 10 mg via INTRAVENOUS
  Filled 2023-11-10: qty 2

## 2023-11-10 MED ORDER — OXYCODONE HCL 5 MG PO TABS
10.0000 mg | ORAL_TABLET | ORAL | Status: DC | PRN
  Administered 2023-11-10 – 2023-11-11 (×5): 10 mg via ORAL
  Filled 2023-11-10 (×3): qty 2

## 2023-11-10 MED ORDER — METHOCARBAMOL 1000 MG/10ML IJ SOLN: 500.0000 mg | Freq: Four times a day (QID) | INTRAMUSCULAR | Status: DC | PRN

## 2023-11-10 MED ORDER — DEXAMETHASONE SODIUM PHOSPHATE 10 MG/ML IJ SOLN
INTRAMUSCULAR | Status: AC
  Filled 2023-11-10: qty 1

## 2023-11-10 MED ORDER — ONDANSETRON HCL 4 MG/2ML IJ SOLN
INTRAMUSCULAR | Status: DC | PRN
  Administered 2023-11-10: 4 mg via INTRAVENOUS

## 2023-11-10 MED ORDER — ACETAMINOPHEN 325 MG PO TABS: 325.0000 mg | ORAL_TABLET | Freq: Four times a day (QID) | ORAL | Status: DC | PRN

## 2023-11-10 MED ORDER — HYDROMORPHONE HCL 1 MG/ML IJ SOLN: 0.5000 mg | INTRAMUSCULAR | Status: DC | PRN

## 2023-11-10 MED ORDER — PROPOFOL 10 MG/ML IV BOLUS
INTRAVENOUS | Status: DC | PRN
  Administered 2023-11-10: 80 ug/kg/min via INTRAVENOUS
  Administered 2023-11-10: 100 ug/kg/min via INTRAVENOUS

## 2023-11-10 MED ORDER — ONDANSETRON HCL 4 MG PO TABS
4.0000 mg | ORAL_TABLET | Freq: Four times a day (QID) | ORAL | Status: DC | PRN
  Administered 2023-11-11: 4 mg via ORAL
  Filled 2023-11-10: qty 1

## 2023-11-10 MED ORDER — DOCUSATE SODIUM 100 MG PO CAPS
100.0000 mg | ORAL_CAPSULE | Freq: Two times a day (BID) | ORAL | Status: DC
  Administered 2023-11-10 – 2023-11-11 (×3): 100 mg via ORAL
  Filled 2023-11-10 (×3): qty 1

## 2023-11-10 MED ORDER — METFORMIN HCL 500 MG PO TABS: 1000.0000 mg | ORAL_TABLET | Freq: Every day | ORAL | Status: DC

## 2023-11-10 MED ORDER — HYDROMORPHONE HCL 1 MG/ML IJ SOLN
INTRAMUSCULAR | Status: AC
  Filled 2023-11-10: qty 1

## 2023-11-10 MED ORDER — METOCLOPRAMIDE HCL 5 MG PO TABS: 5.0000 mg | ORAL_TABLET | Freq: Three times a day (TID) | ORAL | Status: DC | PRN

## 2023-11-10 MED ORDER — OXYCODONE HCL 5 MG/5ML PO SOLN: 5.0000 mg | Freq: Once | ORAL | Status: DC | PRN

## 2023-11-10 MED ORDER — ASPIRIN 81 MG PO CHEW
81.0000 mg | CHEWABLE_TABLET | Freq: Two times a day (BID) | ORAL | Status: DC
  Administered 2023-11-10 – 2023-11-11 (×2): 81 mg via ORAL
  Filled 2023-11-10 (×2): qty 1

## 2023-11-10 MED ORDER — ONDANSETRON HCL 4 MG/2ML IJ SOLN: 4.0000 mg | Freq: Four times a day (QID) | INTRAMUSCULAR | Status: DC | PRN

## 2023-11-10 MED ORDER — PHENYLEPHRINE 80 MCG/ML (10ML) SYRINGE FOR IV PUSH (FOR BLOOD PRESSURE SUPPORT)
PREFILLED_SYRINGE | INTRAVENOUS | Status: AC
  Filled 2023-11-10: qty 10

## 2023-11-10 MED ORDER — OXYCODONE HCL 5 MG PO TABS: 5.0000 mg | ORAL_TABLET | Freq: Once | ORAL | Status: DC | PRN

## 2023-11-10 MED ORDER — MENTHOL 3 MG MT LOZG: 1.0000 | LOZENGE | OROMUCOSAL | Status: DC | PRN

## 2023-11-10 MED ORDER — TRANEXAMIC ACID-NACL 1000-0.7 MG/100ML-% IV SOLN
1000.0000 mg | INTRAVENOUS | Status: AC
  Administered 2023-11-10: 1000 mg via INTRAVENOUS
  Filled 2023-11-10: qty 100

## 2023-11-10 MED ORDER — CEFAZOLIN SODIUM-DEXTROSE 2-4 GM/100ML-% IV SOLN
2.0000 g | Freq: Four times a day (QID) | INTRAVENOUS | Status: AC
  Administered 2023-11-10 (×2): 2 g via INTRAVENOUS
  Filled 2023-11-10 (×2): qty 100

## 2023-11-10 MED ORDER — PHENYLEPHRINE 80 MCG/ML (10ML) SYRINGE FOR IV PUSH (FOR BLOOD PRESSURE SUPPORT)
PREFILLED_SYRINGE | INTRAVENOUS | Status: DC | PRN
  Administered 2023-11-10: 160 ug via INTRAVENOUS

## 2023-11-10 MED ORDER — SODIUM CHLORIDE 0.9 % IV SOLN
12.5000 mg | INTRAVENOUS | Status: DC | PRN
  Filled 2023-11-10: qty 0.5

## 2023-11-10 MED ORDER — ALBUTEROL SULFATE (2.5 MG/3ML) 0.083% IN NEBU: 3.0000 mL | INHALATION_SOLUTION | Freq: Four times a day (QID) | RESPIRATORY_TRACT | Status: DC | PRN

## 2023-11-10 MED ORDER — MIDAZOLAM HCL 2 MG/2ML IJ SOLN
INTRAMUSCULAR | Status: DC | PRN
  Administered 2023-11-10: 2 mg via INTRAVENOUS

## 2023-11-10 MED ORDER — CEFAZOLIN SODIUM-DEXTROSE 2-4 GM/100ML-% IV SOLN
2.0000 g | INTRAVENOUS | Status: AC
  Administered 2023-11-10: 2 g via INTRAVENOUS
  Filled 2023-11-10: qty 100

## 2023-11-10 MED ORDER — CHLORHEXIDINE GLUCONATE 0.12 % MT SOLN
15.0000 mL | Freq: Once | OROMUCOSAL | Status: AC
  Administered 2023-11-10: 15 mL via OROMUCOSAL
  Filled 2023-11-10: qty 15

## 2023-11-10 SURGICAL SUPPLY — 43 items
BAG COUNTER SPONGE SURGICOUNT (BAG) ×1 IMPLANT
BENZOIN TINCTURE PRP APPL 2/3 (GAUZE/BANDAGES/DRESSINGS) ×1 IMPLANT
BLADE CLIPPER SURG (BLADE) IMPLANT
BLADE SAW SGTL 18X1.27X75 (BLADE) ×1 IMPLANT
COVER SURGICAL LIGHT HANDLE (MISCELLANEOUS) ×1 IMPLANT
DRAPE C-ARM 42X72 X-RAY (DRAPES) ×1 IMPLANT
DRAPE STERI IOBAN 125X83 (DRAPES) ×1 IMPLANT
DRAPE U-SHAPE 47X51 STRL (DRAPES) ×3 IMPLANT
DRSG AQUACEL AG ADV 3.5X10 (GAUZE/BANDAGES/DRESSINGS) ×1 IMPLANT
DURAPREP 26ML APPLICATOR (WOUND CARE) ×1 IMPLANT
ELECT BLADE 6.5 EXT (BLADE) IMPLANT
ELECTRODE BLDE 4.0 EZ CLN MEGD (MISCELLANEOUS) ×1 IMPLANT
ELECTRODE REM PT RTRN 9FT ADLT (ELECTROSURGICAL) ×1 IMPLANT
FACESHIELD WRAPAROUND (MASK) ×2 IMPLANT
FACESHIELD WRAPAROUND OR TEAM (MASK) ×2 IMPLANT
GLOVE BIOGEL PI IND STRL 8 (GLOVE) ×2 IMPLANT
GLOVE ECLIPSE 8.0 STRL XLNG CF (GLOVE) ×1 IMPLANT
GLOVE ORTHO TXT STRL SZ7.5 (GLOVE) ×2 IMPLANT
GOWN STRL REUS W/ TWL LRG LVL3 (GOWN DISPOSABLE) ×2 IMPLANT
GOWN STRL REUS W/ TWL XL LVL3 (GOWN DISPOSABLE) ×2 IMPLANT
HEAD CERAMIC DELTA 36 PLUS 1.5 (Hips) IMPLANT
KIT BASIN OR (CUSTOM PROCEDURE TRAY) ×1 IMPLANT
KIT TURNOVER KIT B (KITS) ×1 IMPLANT
LINER NEUTRAL 52X36MM PLUS 4 (Liner) IMPLANT
MANIFOLD NEPTUNE II (INSTRUMENTS) ×1 IMPLANT
NS IRRIG 1000ML POUR BTL (IV SOLUTION) ×1 IMPLANT
PACK TOTAL JOINT (CUSTOM PROCEDURE TRAY) ×1 IMPLANT
PAD ARMBOARD POSITIONER FOAM (MISCELLANEOUS) ×1 IMPLANT
PIN SECTOR W/GRIP ACE CUP 52MM (Hips) IMPLANT
SET HNDPC FAN SPRY TIP SCT (DISPOSABLE) ×1 IMPLANT
STAPLER SKIN PROX 35W (STAPLE) IMPLANT
STEM FEMORAL SZ 5MM STD ACTIS (Stem) IMPLANT
STRIP CLOSURE SKIN 1/2X4 (GAUZE/BANDAGES/DRESSINGS) ×2 IMPLANT
SUT ETHIBOND NAB CT1 #1 30IN (SUTURE) ×1 IMPLANT
SUT MNCRL AB 4-0 PS2 18 (SUTURE) IMPLANT
SUT VIC AB 0 CT1 27XBRD ANBCTR (SUTURE) ×1 IMPLANT
SUT VIC AB 1 CT1 27XBRD ANBCTR (SUTURE) ×1 IMPLANT
SUT VIC AB 2-0 CT1 TAPERPNT 27 (SUTURE) ×1 IMPLANT
TOWEL GREEN STERILE (TOWEL DISPOSABLE) ×1 IMPLANT
TOWEL GREEN STERILE FF (TOWEL DISPOSABLE) ×1 IMPLANT
TRAY CATH INTERMITTENT SS 16FR (CATHETERS) IMPLANT
TRAY FOLEY W/BAG SLVR 16FR ST (SET/KITS/TRAYS/PACK) IMPLANT
WATER STERILE IRR 1000ML POUR (IV SOLUTION) ×2 IMPLANT

## 2023-11-10 NOTE — Anesthesia Procedure Notes (Signed)
 Spinal  Patient location during procedure: OR Start time: 11/10/2023 10:39 AM End time: 11/10/2023 10:44 AM Reason for block: surgical anesthesia Staffing Performed: anesthesiologist  Anesthesiologist: Cleotilde Butler Dade, MD Performed by: Cleotilde Butler Dade, MD Authorized by: Cleotilde Butler Dade, MD   Preanesthetic Checklist Completed: patient identified, IV checked, site marked, risks and benefits discussed, surgical consent, monitors and equipment checked, pre-op evaluation and timeout performed Spinal Block Patient position: sitting Prep: DuraPrep Patient monitoring: heart rate, cardiac monitor, continuous pulse ox and blood pressure Approach: midline Location: L3-4 Injection technique: single-shot Needle Needle type: Quincke  Needle gauge: 22 G Needle length: 9 cm Assessment Sensory level: T4 Events: CSF return

## 2023-11-10 NOTE — Anesthesia Preprocedure Evaluation (Signed)
 Anesthesia Evaluation  Patient identified by MRN, date of birth, ID band Patient awake    Reviewed: Allergy & Precautions, H&P , NPO status , Patient's Chart, lab work & pertinent test results  Airway Mallampati: II  TM Distance: >3 FB Neck ROM: Full    Dental no notable dental hx.    Pulmonary asthma , former smoker   Pulmonary exam normal breath sounds clear to auscultation       Cardiovascular negative cardio ROS Normal cardiovascular exam Rhythm:Regular Rate:Normal     Neuro/Psych negative neurological ROS  negative psych ROS   GI/Hepatic negative GI ROS, Neg liver ROS,,,  Endo/Other  negative endocrine ROSdiabetes, Type 2    Renal/GU negative Renal ROS  negative genitourinary   Musculoskeletal  (+) Arthritis , Osteoarthritis,    Abdominal  (+) + obese  Peds negative pediatric ROS (+)  Hematology negative hematology ROS (+)   Anesthesia Other Findings   Reproductive/Obstetrics negative OB ROS                             Anesthesia Physical Anesthesia Plan  ASA: 3  Anesthesia Plan: Spinal   Post-op Pain Management:    Induction: Intravenous  PONV Risk Score and Plan: 2 and Propofol infusion and Treatment may vary due to age or medical condition  Airway Management Planned: Simple Face Mask  Additional Equipment:   Intra-op Plan:   Post-operative Plan:   Informed Consent: I have reviewed the patients History and Physical, chart, labs and discussed the procedure including the risks, benefits and alternatives for the proposed anesthesia with the patient or authorized representative who has indicated his/her understanding and acceptance.     Dental advisory given  Plan Discussed with: CRNA  Anesthesia Plan Comments:        Anesthesia Quick Evaluation

## 2023-11-10 NOTE — Anesthesia Postprocedure Evaluation (Signed)
 Anesthesia Post Note  Patient: NAHJAE HOEG  Procedure(s) Performed: ARTHROPLASTY, HIP, TOTAL, ANTERIOR APPROACH (Right: Hip)     Patient location during evaluation: PACU Anesthesia Type: Spinal Level of consciousness: awake and alert Pain management: pain level controlled Vital Signs Assessment: post-procedure vital signs reviewed and stable Respiratory status: spontaneous breathing, nonlabored ventilation and respiratory function stable Cardiovascular status: blood pressure returned to baseline and stable Postop Assessment: no apparent nausea or vomiting Anesthetic complications: no   No notable events documented.  Last Vitals:  Vitals:   11/10/23 1330 11/10/23 1345  BP: 133/78 139/86  Pulse: 67 62  Resp: 17 10  Temp:    SpO2: 99% 91%    Last Pain:  Vitals:   11/10/23 1345  TempSrc:   PainSc: Asleep                 Butler Levander Pinal

## 2023-11-10 NOTE — Discharge Instructions (Signed)
 Per [facility] clinic policy, our goal is ensure optimal postoperative pain control with a multimodal pain management strategy. For all [facility] patients, our goal is to wean post-operative narcotic medications by 6 weeks post-operatively. If this is not possible due to utilization of pain medication prior to surgery, your [facility] doctor will support your acute post-operative pain control for the first 6 weeks postoperatively, with a plan to transition you back to your primary pain team following that. Amy [name] will work to ensure a Therapist, occupational.  INSTRUCTIONS AFTER JOINT REPLACEMENT   Remove items at home which could result in a fall. This includes throw rugs or furniture in walking pathways ICE to the affected joint every three hours while awake for 30 minutes at a time, for at least the first 3-5 days, and then as needed for pain and swelling.  Continue to use ice for pain and swelling. You may notice swelling that will progress down to the foot and ankle.  This is normal after surgery.  Elevate your leg when you are not up walking on it.   Continue to use the breathing machine you got in the hospital (incentive spirometer) which will help keep your temperature down.  It is common for your temperature to cycle up and down following surgery, especially at night when you are not up moving around and exerting yourself.  The breathing machine keeps your lungs expanded and your temperature down.   DIET:  As you were doing prior to hospitalization, we recommend a well-balanced diet.  DRESSING / WOUND CARE / SHOWERING  Keep the surgical dressing until follow up.  The dressing is water proof, so you can shower without any extra covering.  IF THE DRESSING FALLS OFF or the wound gets wet inside, change the dressing with sterile gauze.  Please use good hand washing techniques before changing the dressing.  Do not use any lotions or creams on the incision until instructed by your surgeon.     ACTIVITY  Increase activity slowly as tolerated, but follow the weight bearing instructions below.   No driving for 6 weeks or until further direction given by your physician.  You cannot drive while taking narcotics.  No lifting or carrying greater than 10 lbs. until further directed by your surgeon. Avoid periods of inactivity such as sitting longer than an hour when not asleep. This helps prevent blood clots.  You may return to work once you are authorized by your doctor.     WEIGHT BEARING   Weight bearing as tolerated with assist device (walker, cane, etc) as directed, use it as long as suggested by your surgeon or therapist, typically at least 4-6 weeks.   EXERCISES  Results after joint replacement surgery are often greatly improved when you follow the exercise, range of motion and muscle strengthening exercises prescribed by your doctor. Safety measures are also important to protect the joint from further injury. Any time any of these exercises cause you to have increased pain or swelling, decrease what you are doing until you are comfortable again and then slowly increase them. If you have problems or questions, call your caregiver or physical therapist for advice.   Rehabilitation is important following a joint replacement. After just a few days of immobilization, the muscles of the leg can become weakened and shrink (atrophy).  These exercises are designed to build up the tone and strength of the thigh and leg muscles and to improve motion. Often times heat used for twenty to thirty minutes before  working out will loosen up your tissues and help with improving the range of motion but do not use heat for the first two weeks following surgery (sometimes heat can increase post-operative swelling).   These exercises can be done on a training (exercise) mat, on the floor, on a table or on a bed. Use whatever works the best and is most comfortable for you.    Use music or television  while you are exercising so that the exercises are a pleasant break in your day. This will make your life better with the exercises acting as a break in your routine that you can look forward to.   Perform all exercises about fifteen times, three times per day or as directed.  You should exercise both the operative leg and the other leg as well.  Exercises include:   Quad Sets - Tighten up the muscle on the front of the thigh (Quad) and hold for 5-10 seconds.   Straight Leg Raises - With your knee straight (if you were given a brace, keep it on), lift the leg to 60 degrees, hold for 3 seconds, and slowly lower the leg.  Perform this exercise against resistance later as your leg gets stronger.  Leg Slides: Lying on your back, slowly slide your foot toward your buttocks, bending your knee up off the floor (only go as far as is comfortable). Then slowly slide your foot back down until your leg is flat on the floor again.  Angel Wings: Lying on your back spread your legs to the side as far apart as you can without causing discomfort.  Hamstring Strength:  Lying on your back, push your heel against the floor with your leg straight by tightening up the muscles of your buttocks.  Repeat, but this time bend your knee to a comfortable angle, and push your heel against the floor.  You may put a pillow under the heel to make it more comfortable if necessary.   A rehabilitation program following joint replacement surgery can speed recovery and prevent re-injury in the future due to weakened muscles. Contact your doctor or a physical therapist for more information on knee rehabilitation.    CONSTIPATION  Constipation is defined medically as fewer than three stools per week and severe constipation as less than one stool per week.  Even if you have a regular bowel pattern at home, your normal regimen is likely to be disrupted due to multiple reasons following surgery.  Combination of anesthesia, postoperative  narcotics, change in appetite and fluid intake all can affect your bowels.   YOU MUST use at least one of the following options; they are listed in order of increasing strength to get the job done.  They are all available over the counter, and you may need to use some, POSSIBLY even all of these options:    Drink plenty of fluids (prune juice may be helpful) and high fiber foods Colace 100 mg by mouth twice a day  Senokot for constipation as directed and as needed Dulcolax (bisacodyl), take with full glass of water  Miralax (polyethylene glycol) once or twice a day as needed.  If you have tried all these things and are unable to have a bowel movement in the first 3-4 days after surgery call either your surgeon or your primary doctor.    If you experience loose stools or diarrhea, hold the medications until you stool forms back up.  If your symptoms do not get better within 1 week  or if they get worse, check with your doctor.  If you experience "the worst abdominal pain ever" or develop nausea or vomiting, please contact the office immediately for further recommendations for treatment.   ITCHING:  If you experience itching with your medications, try taking only a single pain pill, or even half a pain pill at a time.  You can also use Benadryl over the counter for itching or also to help with sleep.   TED HOSE STOCKINGS:  Use stockings on both legs until for at least 2 weeks or as directed by physician office. They may be removed at night for sleeping.  MEDICATIONS:  See your medication summary on the "After Visit Summary" that nursing will review with you.  You may have some home medications which will be placed on hold until you complete the course of blood thinner medication.  It is important for you to complete the blood thinner medication as prescribed.  PRECAUTIONS:  If you experience chest pain or shortness of breath - call 911 immediately for transfer to the hospital emergency department.    If you develop a fever greater that 101 F, purulent drainage from wound, increased redness or drainage from wound, foul odor from the wound/dressing, or calf pain - CONTACT YOUR SURGEON.                                                   FOLLOW-UP APPOINTMENTS:  If you do not already have a post-op appointment, please call the office for an appointment to be seen by your surgeon.  Guidelines for how soon to be seen are listed in your "After Visit Summary", but are typically between 1-4 weeks after surgery.  OTHER INSTRUCTIONS:   Knee Replacement:  Do not place pillow under knee, focus on keeping the knee straight while resting. CPM instructions: 0-90 degrees, 2 hours in the morning, 2 hours in the afternoon, and 2 hours in the evening. Place foam block, curve side up under heel at all times except when in CPM or when walking.  DO NOT modify, tear, cut, or change the foam block in any way.  POST-OPERATIVE OPIOID TAPER INSTRUCTIONS: It is important to wean off of your opioid medication as soon as possible. If you do not need pain medication after your surgery it is ok to stop day one. Opioids include: Codeine, Hydrocodone(Norco, Vicodin), Oxycodone(Percocet, oxycontin) and hydromorphone amongst others.  Long term and even short term use of opiods can cause: Increased pain response Dependence Constipation Depression Respiratory depression And more.  Withdrawal symptoms can include Flu like symptoms Nausea, vomiting And more Techniques to manage these symptoms Hydrate well Eat regular healthy meals Stay active Use relaxation techniques(deep breathing, meditating, yoga) Do Not substitute Alcohol to help with tapering If you have been on opioids for less than two weeks and do not have pain than it is ok to stop all together.  Plan to wean off of opioids This plan should start within one week post op of your joint replacement. Maintain the same interval or time between taking each dose  and first decrease the dose.  Cut the total daily intake of opioids by one tablet each day Next start to increase the time between doses. The last dose that should be eliminated is the evening dose.   MAKE SURE YOU:  Understand these instructions.  Get help right away if you are not doing well or get worse.    Thank you for letting us be a part of your medical care team.  It is a privilege we respect greatly.  We hope these instructions will help you stay on track for a fast and full recovery!      Dental Antibiotics:  In most cases prophylactic antibiotics for Dental procdeures after total joint surgery are not necessary.  Exceptions are as follows:  1. History of prior total joint infection  2. Severely immunocompromised (Organ Transplant, cancer chemotherapy, Rheumatoid biologic meds such as Humera)  3. Poorly controlled diabetes (A1C &gt; 8.0, blood glucose over 200)  If you have one of these conditions, contact your surgeon for an antibiotic prescription, prior to your dental procedure.

## 2023-11-10 NOTE — Evaluation (Signed)
 Physical Therapy Evaluation Patient Details Name: Amy [name] MRN: [id number] DOB: 12-24-1950 Today's Date: 11/10/2023  History of Present Illness  73 y.o. female presents to [facility] hospital on 11/10/2023 for elective R THA. PMH includes diabetes, asthma.  Clinical Impression  Pt presents to PT with deficits in functional mobility, gait, balance, strength, ROM. Pt is able to ambulate for household distances with support of the RW. PT provides education on the THA exercise packet and encourages frequent mobilization with staff assistance. PT will follow up tomorrow for a progression of gait and to initiate stair training.          If plan is discharge home, recommend the following: A little help with bathing/dressing/bathroom;Assistance with cooking/housework;Assist for transportation;Help with stairs or ramp for entrance   Can travel by private vehicle        Equipment Recommendations Rolling walker (2 wheels);BSC/3in1  Recommendations for Other Services       Functional Status Assessment Patient has had a recent decline in their functional status and demonstrates the ability to make significant improvements in function in a reasonable and predictable amount of time.     Precautions / Restrictions Precautions Precautions: Fall Recall of Precautions/Restrictions: Intact Precaution/Restrictions Comments: direct anterior THA Restrictions Weight Bearing Restrictions Per Provider Order: Yes RLE Weight Bearing Per Provider Order: Weight bearing as tolerated      Mobility  Bed Mobility Overal bed mobility: Needs Assistance Bed Mobility: Supine to Sit     Supine to sit: Min assist     General bed mobility comments: assist for RLE    Transfers Overall transfer level: Needs assistance Equipment used: Rolling walker (2 wheels) Transfers: Sit to/from Stand Sit to Stand: Contact guard assist                Ambulation/Gait Ambulation/Gait assistance: Contact guard  assist Gait Distance (Feet): 30 Feet Assistive device: Rolling walker (2 wheels) Gait Pattern/deviations: Step-to pattern Gait velocity: reduced Gait velocity interpretation: <1.31 ft/sec, indicative of household ambulator   General Gait Details: slowed step-to gait, reduced stance time on RLE  Stairs            Wheelchair Mobility     Tilt Bed    Modified Rankin (Stroke Patients Only)       Balance Overall balance assessment: Needs assistance Sitting-balance support: No upper extremity supported, Feet supported Sitting balance-Leahy Scale: Good     Standing balance support: Bilateral upper extremity supported, Reliant on assistive device for balance Standing balance-Leahy Scale: Poor                               Pertinent Vitals/Pain Pain Assessment Pain Assessment: Faces Faces Pain Scale: Hurts little more Pain Location: R hip Pain Descriptors / Indicators: Sore Pain Intervention(s): Monitored during session    Home Living Family/patient expects to be discharged to:: Private residence Living Arrangements: Spouse/significant other Available Help at Discharge: Family;Available 24 hours/day Type of Home: House Home Access: Stairs to enter Entrance Stairs-Rails:  (post) Entrance Stairs-Number of Steps: 1   Home Layout: One level Home Equipment: Cane - single point      Prior Function Prior Level of Function : Independent/Modified Independent                     Extremity/Trunk Assessment   Upper Extremity Assessment Upper Extremity Assessment: Overall WFL for tasks assessed    Lower Extremity Assessment Lower Extremity Assessment: RLE  deficits/detail RLE Deficits / Details: generalized post-op weakness RLE Sensation: decreased light touch    Cervical / Trunk Assessment Cervical / Trunk Assessment: Normal  Communication   Communication Communication: No apparent difficulties    Cognition Arousal: Alert Behavior During  Therapy: WFL for tasks assessed/performed   PT - Cognitive impairments: No apparent impairments                         Following commands: Intact       Cueing Cueing Techniques: Verbal cues     General Comments General comments (skin integrity, edema, etc.): VSS on RA    Exercises     Assessment/Plan    PT Assessment Patient needs continued PT services  PT Problem List Decreased strength;Decreased activity tolerance;Decreased balance;Decreased mobility;Decreased knowledge of use of DME;Pain       PT Treatment Interventions DME instruction;Gait training;Stair training;Functional mobility training;Therapeutic activities;Therapeutic exercise;Balance training;Neuromuscular re-education;Patient/family education    PT Goals (Current goals can be found in the Care Plan section)  Acute Rehab PT Goals Patient Stated Goal: to return to independence PT Goal Formulation: With patient Time For Goal Achievement: 11/14/23 Potential to Achieve Goals: Good    Frequency 7X/week     Co-evaluation               AM-PAC PT 6 Clicks Mobility  Outcome Measure Help needed turning from your back to your side while in a flat bed without using bedrails?: A Little Help needed moving from lying on your back to sitting on the side of a flat bed without using bedrails?: A Little Help needed moving to and from a bed to a chair (including a wheelchair)?: A Little Help needed standing up from a chair using your arms (e.g., wheelchair or bedside chair)?: A Little Help needed to walk in hospital room?: A Little Help needed climbing 3-5 steps with a railing? : A Lot 6 Click Score: 17    End of Session Equipment Utilized During Treatment: Gait belt Activity Tolerance: Patient tolerated treatment well Patient left: in chair;with call bell/phone within reach;with chair alarm set Nurse Communication: Mobility status PT Visit Diagnosis: Other abnormalities of gait and mobility  (R26.89);Muscle weakness (generalized) (M62.81)    Time: 8366-8347 PT Time Calculation (min) (ACUTE ONLY): 19 min   Charges:   PT Evaluation $PT Eval Low Complexity: 1 Low   PT General Charges $$ ACUTE PT VISIT: 1 Visit         Bernardino JINNY Ruth, PT, DPT Acute Rehabilitation Office [phone]   Bernardino JINNY Ruth 11/10/2023, 4:59 PM

## 2023-11-10 NOTE — Op Note (Signed)
 Operative Note  Date of operation: 11/10/2023 Preoperative diagnosis: Right hip primary osteoarthritis Postoperative diagnosis: Same  Procedure: Right direct anterior total hip arthroplasty  Implants: Implant Name Type Inv. Item Serial No. Manufacturer Lot No. LRB No. Used Action  PIN SECTOR W/GRIP ACE CUP - [id number] Hips PIN SECTOR W/GRIP ACE CUP  [employer] [id number] Right 1 Implanted  LINER NEUTRAL [id number] PLUS 4 - [id number] Liner LINER NEUTRAL [id number] PLUS 4  DEPUY [employer]TH[employer] [id number] Right 1 Implanted  STEM FEMORAL SZ STD ACTIS - [id number] Stem STEM FEMORAL SZ STD ACTIS  [employer]PU[employer]TH[employer] [id number] Right 1 Implanted  HEAD CERAMIC DELTA 36 PLUS 1.5 - [id number] Hips HEAD CERAMIC DELTA 36 PLUS 1.5  [employer]PU[employer] [id number] Right 1 Implanted   Surgeon: Lonni GRADE. Vernetta, MD Assistant: April Green, RNFA  Anesthesia: Spinal EBL: 150 to 200 cc Antibiotics: IV Ancef Complications: None  Indications: The patient 73 year old female well-known to us .  She unfortunately has developed debilitating arthritis involving her right hip.  This has been confirmed with x-ray findings and clinical exam findings.  She has tried and failed conservative treatment for some period of time.  At this point her right hip pain is daily and it is detrimentally affecting her mobility, her quality of life and her actives daily living to the point she does wish to proceed with a hip replacement and we agree with this as well.  We did discuss in length in detail the risks of acute blood loss anemia, nerve vessel injury, fracture, infection, DVT, leg length differences, dislocation, wound healing issues and implant failure.  She understands that our goals are hopefully decreased pain, improved mobility and improved quality of life.  Procedure description: After informed consent was obtained and the appropriate right hip was marked, the patient was brought to the operating room and set  up on the stretcher where spinal anesthesia was obtained.  She was then laid in supine position on the stretcher and a Foley catheter was placed.  Traction boots were placed on both her feet and next she was placed supine on the Hana fracture table with a perineal post in place in both legs in inline skeletal traction devices but no traction applied.  The right operative hip was assessed radiographically as well as the pelvis.  The right hip was prepped and draped in DuraPrep and sterile drapes.  A timeout was called and she was identified as the correct patient and the correct right hip.  An incision was then made just inferior and posterior to the ASIS and carried slightly obliquely down the leg.  Dissection was carried down to the tensor fascia lateral muscle and the tensor fascia was divided longitudinally to proceed with a direct injury broach the hip.  Circumflex vessels were identified and cauterized.  The hip capsule identified and opened up in L-type format finding a moderate joint effusion.  Cobra retractors were placed around the medial and lateral femoral neck and a femoral neck cut was made with an oscillating saw just proximal to the lesser trochanter and this cut was completed with an osteotome.  A corkscrew guide was placed in the femoral head and the femoral head was removed in its entirety and found to have a wide area devoid of cartilage.  A bent Hohmann was then placed over the medial acetabular rim and remnants of the acetabular labrum and other debris were removed.  Reaming was initiated under direct visualization from a size 43 reamer and stepwise increments  going up to a size 51 reamer with all reamers again placed under direct visualization and the last reamer also placed under direct fluoroscopy in order to obtain the depth reaming, the inclination and anteversion.  The real DePuy sector GRIPTION acetabular opponent size 52 was then placed without difficulty followed by a 36+4 polythene  liner.  Attention was then turned to the femur.  With the right leg externally rotated to 120 degrees, extended and adducted, a Mueller retractors placed medially and Hohmann tractor by the greater trochanter.  The lateral joint capsule was released and a box cutting osteotome was used into the femoral canal.  Broaching was initiated using the Actis broaching system from a size 0 going up to a size 5.  With a size 5 in place we trialed a standard offset femoral neck and a 36+1.5 trial hip ball.  The right leg was brought over and up and with traction and internal rotation reduced in the pelvis.  Based on radiographic and clinical assessment we are pleased with leg length, offset, range of motion and stability.  We then dislocated the hip remove the trial components.  We placed the real Actis femoral component with standard offset size 5 followed by the real 36+1.5 ceramic head ball.  Again this reduced into the acetabulum.  We assessed it radiographically and clinically we are pleased with range of motion as well as stability, offset and leg length.  We then irrigated the soft tissue with normal saline solution.  Remnants of the joint capsule were closed with interrupted #1 Ethibond suture followed by #1 Vicryl to close the tensor fascia.  0 Vicryl was used to close the deep tissue and 2-0 Vicryl was used to close subcutaneous tissue.  The skin was closed with staples.  An Aquacel dressing was applied.  The patient was taken off the Hana table and taken the recovery room.

## 2023-11-10 NOTE — Transfer of Care (Signed)
 Immediate Anesthesia Transfer of Care Note  Patient: Amy [name]  Procedure(s) Performed: ARTHROPLASTY, HIP, TOTAL, ANTERIOR APPROACH (Right: Hip)  Patient Location: PACU  Anesthesia Type:MAC combined with regional for post-op pain  Level of Consciousness: awake, alert , and oriented  Airway & Oxygen Therapy: Patient Spontanous Breathing and Patient connected to face mask oxygen  Post-op Assessment: Report given to RN and Post -op Vital signs reviewed and stable  Post vital signs: Reviewed and stable  Last Vitals:  Vitals Value Taken Time  BP 119/75 11/10/23 12:30  Temp 36.4 C 11/10/23 12:25  Pulse 76 11/10/23 12:31  Resp 18 11/10/23 12:31  SpO2 95 % 11/10/23 12:31  Vitals shown include unfiled device data.  Last Pain:  Vitals:   11/10/23 1225  TempSrc:   PainSc: 0-No pain         Complications: No notable events documented.

## 2023-11-10 NOTE — Interval H&P Note (Signed)
 History and Physical Interval Note: The patient understands that she is here today for a right total hip replacement to treat her significant right hip pain and arthritis.  There has been no acute or interval change in her medical status.  The risks and benefits of surgery have been discussed in detail and informed consent has been obtained.  The right operative hip has been marked.  11/10/2023 8:55 AM  Amy [name]  has presented today for surgery, with the diagnosis of Osteoarthritis Right Hip.  The various methods of treatment have been discussed with the patient and family. After consideration of risks, benefits and other options for treatment, the patient has consented to  Procedure(s): ARTHROPLASTY, HIP, TOTAL, ANTERIOR APPROACH (Right) as a surgical intervention.  The patient's history has been reviewed, patient examined, no change in status, stable for surgery.  I have reviewed the patient's chart and labs.  Questions were answered to the patient's satisfaction.     Amy [name]

## 2023-11-11 ENCOUNTER — Encounter (HOSPITAL_COMMUNITY): Payer: Self-pay | Admitting: Orthopaedic Surgery

## 2023-11-11 ENCOUNTER — Telehealth: Payer: Self-pay | Admitting: Radiology

## 2023-11-11 DIAGNOSIS — M1611 Unilateral primary osteoarthritis, right hip: Secondary | ICD-10-CM | POA: Diagnosis not present

## 2023-11-11 DIAGNOSIS — Z7901 Long term (current) use of anticoagulants: Secondary | ICD-10-CM | POA: Diagnosis not present

## 2023-11-11 DIAGNOSIS — Z7984 Long term (current) use of oral hypoglycemic drugs: Secondary | ICD-10-CM | POA: Diagnosis not present

## 2023-11-11 DIAGNOSIS — Z96641 Presence of right artificial hip joint: Secondary | ICD-10-CM | POA: Diagnosis not present

## 2023-11-11 DIAGNOSIS — E119 Type 2 diabetes mellitus without complications: Secondary | ICD-10-CM | POA: Diagnosis not present

## 2023-11-11 DIAGNOSIS — J45909 Unspecified asthma, uncomplicated: Secondary | ICD-10-CM | POA: Diagnosis not present

## 2023-11-11 DIAGNOSIS — Z87891 Personal history of nicotine dependence: Secondary | ICD-10-CM | POA: Diagnosis not present

## 2023-11-11 LAB — CBC
HCT: 35.7 % — ABNORMAL LOW (ref 36.0–46.0)
Hemoglobin: 12.2 g/dL (ref 12.0–15.0)
MCH: 29.3 pg (ref 26.0–34.0)
MCHC: 34.2 g/dL (ref 30.0–36.0)
MCV: 85.8 fL (ref 80.0–100.0)
Platelets: 287 10*3/uL (ref 150–400)
RBC: 4.16 MIL/uL (ref 3.87–5.11)
RDW: 12.6 % (ref 11.5–15.5)
WBC: 12 10*3/uL — ABNORMAL HIGH (ref 4.0–10.5)
nRBC: 0 % (ref 0.0–0.2)

## 2023-11-11 LAB — GLUCOSE, CAPILLARY: Glucose-Capillary: 142 mg/dL — ABNORMAL HIGH (ref 70–99)

## 2023-11-11 LAB — BASIC METABOLIC PANEL WITH GFR
Anion gap: 10 (ref 5–15)
BUN: 8 mg/dL (ref 8–23)
CO2: 24 mmol/L (ref 22–32)
Calcium: 9.1 mg/dL (ref 8.9–10.3)
Chloride: 99 mmol/L (ref 98–111)
Creatinine, Ser: 0.65 mg/dL (ref 0.44–1.00)
GFR, Estimated: >60 mL/min (ref >60)
Glucose, Bld: 186 mg/dL — ABNORMAL HIGH (ref 70–99)
Potassium: 3.8 mmol/L (ref 3.5–5.1)
Sodium: 133 mmol/L — ABNORMAL LOW (ref 135–145)

## 2023-11-11 MED ORDER — ASPIRIN 81 MG PO CHEW: 81.0000 mg | CHEWABLE_TABLET | Freq: Two times a day (BID) | ORAL | 0 refills | Status: AC

## 2023-11-11 MED ORDER — METHOCARBAMOL 500 MG PO TABS: 500.0000 mg | ORAL_TABLET | Freq: Four times a day (QID) | ORAL | 1 refills | Status: DC | PRN

## 2023-11-11 MED ORDER — OXYCODONE HCL 5 MG PO TABS: 5.0000 mg | ORAL_TABLET | Freq: Four times a day (QID) | ORAL | 0 refills | Status: DC | PRN

## 2023-11-11 NOTE — Progress Notes (Signed)
 Pt doing well. Pt given D/C instructions with verbal understanding. Rx's were sent to the pharmacy by MD. Pt's incision is clean and dry with no sign of infection. Pt's IV was removed prior to D/C. Pt received RW from [employer] per MD order. Pt D/C'd home via wheelchair per MD order. Pt is stable @ D/C and has no other needs at this time. Rema Fendt, RN

## 2023-11-11 NOTE — Discharge Summary (Signed)
 Patient ID: Amy [name] MRN: [id number] DOB/AGE: [birth year] 73 y.o.  Admit date: 11/10/2023 Discharge date: 11/11/2023  Admission Diagnoses:  Principal Problem:   Unilateral primary osteoarthritis, right hip Active Problems:   Status post total replacement of right hip   Discharge Diagnoses:  Same  Past Medical History:  Diagnosis Date   Arthritis    Right Hip   Colon polyp, hyperplastic 03/29/2010   Diabetes mellitus    History of kidney stones    Mild intermittent asthma    Pt denies    Surgeries: Procedure(s): ARTHROPLASTY, HIP, TOTAL, ANTERIOR APPROACH on 11/10/2023   Consultants:   Discharged Condition: Improved  Hospital Course: Amy [name] is an 73 y.o. female who was admitted 11/10/2023 for operative treatment ofUnilateral primary osteoarthritis, right hip. Patient has severe unremitting pain that affects sleep, daily activities, and work/hobbies. After pre-op clearance the patient was taken to the operating room on 11/10/2023 and underwent  Procedure(s): ARTHROPLASTY, HIP, TOTAL, ANTERIOR APPROACH.    Patient was given perioperative antibiotics:  Anti-infectives (From admission, onward)    Start     Dose/Rate Route Frequency Ordered Stop   11/10/23 1530  ceFAZolin (ANCEF) IVPB 2g/100 mL premix        2 g 200 mL/hr over 30 Minutes Intravenous Every 6 hours 11/10/23 1439 11/10/23 2227   11/10/23 0815  ceFAZolin (ANCEF) IVPB 2g/100 mL premix        2 g 200 mL/hr over 30 Minutes Intravenous On call to O.R. 11/10/23 0802 11/10/23 1037        Patient was given sequential compression devices, early ambulation, and chemoprophylaxis to prevent DVT.  Patient benefited maximally from hospital stay and there were no complications.    Recent vital signs: Patient Vitals for the past 24 hrs:  BP Temp Temp src Pulse Resp SpO2  11/11/23 0858 -- -- -- -- -- 99 %  11/11/23 0750 (!) 108/59 98.8 F (37.1 C) Oral 88 17 97 %  11/11/23 0404 125/61 98.7 F (37.1 C) Oral 90  18 94 %  11/10/23 2307 138/86 98.2 F (36.8 C) Oral 80 18 99 %  11/10/23 1950 126/70 98.9 F (37.2 C) Oral 81 18 95 %     Recent laboratory studies:  Recent Labs    11/11/23 0736  WBC 12.0*  HGB 12.2  HCT 35.7*  PLT 287  NA 133*  K 3.8  CL 99  CO2 24  BUN 8  CREATININE 0.65  GLUCOSE 186*  CALCIUM 9.1     Discharge Medications:   Allergies as of 11/11/2023   No Known Allergies      Medication List     TAKE these medications    acetaminophen  500 MG tablet Commonly known as: TYLENOL  Take 1 tablet (500 mg total) by mouth every 6 (six) hours as needed.   albuterol  108 (90 Base) MCG/ACT inhaler Commonly known as: VENTOLIN  HFA Inhale 1-2 puffs into the lungs every 6 (six) hours as needed (asthma).   amoxicillin 500 MG capsule Commonly known as: AMOXIL Take 500 mg by mouth 4 (four) times daily.   aspirin  81 MG chewable tablet Chew 1 tablet (81 mg total) by mouth 2 (two) times daily.   Breo Ellipta  200-25 MCG/ACT Aepb Generic drug: fluticasone  furoate-vilanterol TAKE 1 PUFF BY MOUTH EVERY DAY   FreeStyle Libre 3 Reader Devi 1 Device by Does not apply route once for 1 dose. Use as directed to monitor patient blood glucose   FreeStyle Libre 3 Sensor Misc  Apply 1 senor to skin every 14 days to monitor blood glucose. Use as directed   ibuprofen  600 MG tablet Commonly known as: ADVIL  Take 1 tablet (600 mg total) by mouth every 6 (six) hours as needed. What changed:  how much to take reasons to take this   magnesium  citrate Soln Take 1 Bottle by mouth once as needed for severe constipation.   metFORMIN  1000 MG tablet Commonly known as: GLUCOPHAGE  TAKE 1 TABLET (1,000 MG TOTAL) BY MOUTH TWICE A DAY WITH FOOD What changed: See the new instructions.   methocarbamol  500 MG tablet Commonly known as: ROBAXIN  Take 1 tablet (500 mg total) by mouth every 6 (six) hours as needed for muscle spasms.   oxyCODONE  5 MG immediate release tablet Commonly known as: Oxy  IR/ROXICODONE  Take 1-2 tablets (5-10 mg total) by mouth every 6 (six) hours as needed for moderate pain (pain score 4-6) (pain score 4-6; 5 mg for pain score 4-5, 10 mg for pain score 6).   polyethylene glycol powder 17 GM/SCOOP powder Commonly known as: GLYCOLAX/MIRALAX Take 17 g by mouth daily as needed (constipation).   Preparation H 1 % Crea Generic drug: Hydrocortisone  (Perianal) Place 1 Applicatorful rectally daily as needed (hemorrhoids).               Durable Medical Equipment  (From admission, onward)           Start     Ordered   11/10/23 1440  DME 3 n 1  Once        11/10/23 1439   11/10/23 1440  DME Walker rolling  Once       Question Answer Comment  Walker: With 5 Inch Wheels   Patient needs a walker to treat with the following condition Status post total replacement of right hip      11/10/23 1439            Diagnostic Studies: DG Pelvis Portable Result Date: 11/10/2023 CLINICAL DATA:  Status post right hip replacement. EXAM: PORTABLE PELVIS 1-2 VIEWS COMPARISON:  None Available. FINDINGS: Right hip arthroplasty in expected alignment. No periprosthetic lucency or fracture. Recent postsurgical change includes air and edema in the soft tissues. Overlying skin staples in place. IMPRESSION: Right hip arthroplasty without immediate postoperative complication. Electronically Signed   By: Andrea Gasman M.D.   On: 11/10/2023 14:07   DG HIP UNILAT WITH PELVIS 1V RIGHT Result Date: 11/10/2023 CLINICAL DATA:  Total right hip arthroplasty. Intraoperative fluoroscopy. EXAM: DG HIP (WITH OR WITHOUT PELVIS) 1V RIGHT COMPARISON:  Pelvis and right hip radiographs 09/24/2023 FINDINGS: Images were performed intraoperatively without the presence of a radiologist. Interval total right hip arthroplasty. No hardware complication is seen. Total fluoroscopy images: 3 Total fluoroscopy time: 19 seconds Total dose: Radiation Exposure Index (as provided by the fluoroscopic device):  3.3 mGy air Kerma Please see intraoperative findings for further detail. IMPRESSION: Intraoperative fluoroscopy for total right hip arthroplasty. Electronically Signed   By: Tanda Lyons M.D.   On: 11/10/2023 12:31   DG C-Arm 1-60 Min-No Report Result Date: 11/10/2023 Fluoroscopy was utilized by the requesting physician.  No radiographic interpretation.   DG Bone Density Result Date: 10/20/2023 Table formatting from the original result was not included. Date of study: 10/14/2023 Exam: DUAL X-RAY ABSORPTIOMETRY (DXA) FOR BONE MINERAL DENSITY (BMD) Instrument: [employer] Requesting Provider: PCP Indication: screening for osteoporosis Comparison: none (please note that it is not possible to compare data from different instruments) Clinical data: Pt is  a 73 y.o. female without previous fractures.  On calcium and vitamin D. Results:  Lumbar spine L1-L2 (L3, L4) Femoral neck (FN) 33% distal radius Ultra distal radius T-score +2.5 RFN: -0.5 LFN: -0.8 +0.6 -0.5 Assessment: the BMD is normal according to the [facility] classification for osteoporosis (see below). Fracture risk: low FRAX score: not calculated due to normal BMD Comments: the technical quality of the study is good, however, L3 and L4 vertebrae had to be excluded from analysis due to degenerative changes. Recommend optimizing calcium (1200 mg/day) and vitamin D (800 IU/day) intake. No pharmacological treatment is indicated. Followup: Repeat BMD is appropriate after 2-5 years. WHO criteria for diagnosis of osteoporosis in postmenopausal women and in men 79 y/o or older: - normal: T-score -1.0 to + 1.0 - osteopenia/low bone density: T-score between -2.5 and -1.0 - osteoporosis: T-score below -2.5 - severe osteoporosis: T-score below -2.5 with history of fragility fracture Note: although not part of the WHO classification, the presence of a fragility fracture, regardless of the T-score, should be considered diagnostic of osteoporosis, provided other causes  for the fracture have been excluded. Lela Fendt, MD [facility]   Disposition: Discharge disposition: 01-Home or Self Care          Follow-up Information     [facility] Follow up.   Specialty: Home Health Services Why[facility] will contact you for the first home visit Contact information: [address] [place] [address] [phone]         Vernetta Lonni GRADE, MD Follow up in 2 week(s).   Specialty: Orthopedic Surgery Contact information: [address] [place] [address] [phone]                  Signed: Lonni GRADE Vernetta 11/11/2023, 5:26 PM

## 2023-11-11 NOTE — Progress Notes (Signed)
 Physical Therapy Treatment  Patient Details Name: Amy [name] MRN: [id number] DOB: [birth year] Today's Date: 11/11/2023   History of Present Illness 73 y.o. female presents to [facility] hospital on 11/10/2023 for elective R THA. PMH includes diabetes, asthma.    PT Comments  Pt progressing towards physical therapy goals. Reports she continues to be nauseated but was able to participate in full PT session. Reviewed supine/seated HEP and recommended pt wait for HHPT before attempting standing exercise. Pt progressing with gait and stair training. Will continue to follow and progress as able per POC.     If plan is discharge home, recommend the following: A little help with bathing/dressing/bathroom;Assistance with cooking/housework;Assist for transportation;Help with stairs or ramp for entrance   Can travel by private vehicle        Equipment Recommendations  Rolling walker (2 wheels);BSC/3in1    Recommendations for Other Services       Precautions / Restrictions Precautions Precautions: Fall Recall of Precautions/Restrictions: Intact Precaution/Restrictions Comments: direct anterior THA Restrictions Weight Bearing Restrictions Per Provider Order: Yes RLE Weight Bearing Per Provider Order: Weight bearing as tolerated     Mobility  Bed Mobility Overal bed mobility: Needs Assistance Bed Mobility: Supine to Sit, Sit to Supine     Supine to sit: Min assist Sit to supine: Min assist   General bed mobility comments: Assist for RLE management and repositioning.    Transfers Overall transfer level: Needs assistance Equipment used: Rolling walker (2 wheels) Transfers: Sit to/from Stand Sit to Stand: Contact guard assist           General transfer comment: VC's for hand placement on seated surface for safety. Encouraged pt to extend RLE out prior to sitting for pain control.    Ambulation/Gait Ambulation/Gait assistance: Contact guard assist Gait Distance (Feet): 125  Feet Assistive device: Rolling walker (2 wheels) Gait Pattern/deviations: Step-to pattern, Step-through pattern, Decreased stride length, Trunk flexed Gait velocity: Decreased Gait velocity interpretation: 1.31 - 2.62 ft/sec, indicative of limited community ambulator   General Gait Details: VC's for improved posture, increased heel strike and sequencing with the RW for support.   Stairs Stairs: Yes Stairs assistance: Contact guard assist Stair Management: Two rails, Step to pattern, Forwards Number of Stairs: 1 (x2) General stair comments: VC's for sequencing and general safety.   Wheelchair Mobility     Tilt Bed    Modified Rankin (Stroke Patients Only)       Balance Overall balance assessment: Needs assistance Sitting-balance support: No upper extremity supported, Feet supported Sitting balance-Leahy Scale: Good     Standing balance support: Bilateral upper extremity supported, Reliant on assistive device for balance Standing balance-Leahy Scale: Poor                              Communication Communication Communication: No apparent difficulties  Cognition Arousal: Alert Behavior During Therapy: WFL for tasks assessed/performed   PT - Cognitive impairments: No apparent impairments                         Following commands: Intact      Cueing Cueing Techniques: Verbal cues  Exercises General Exercises - Lower Extremity Ankle Circles/Pumps: 10 reps Quad Sets: 10 reps Short Arc Quad: 10 reps, AAROM Long Arc Quad: 10 reps (low range actively) Heel Slides: 10 reps, AAROM Hip ABduction/ADduction: 10 reps, AAROM    General Comments  Pertinent Vitals/Pain Pain Assessment Pain Assessment: Faces Faces Pain Scale: Hurts little more Pain Location: R hip Pain Descriptors / Indicators: Sore Pain Intervention(s): Limited activity within patient's tolerance, Monitored during session, Repositioned    Home Living                           Prior Function            PT Goals (current goals can now be found in the care plan section) Acute Rehab PT Goals Patient Stated Goal: to return to independence PT Goal Formulation: With patient Time For Goal Achievement: 11/14/23 Potential to Achieve Goals: Good Progress towards PT goals: Progressing toward goals    Frequency    7X/week      PT Plan      Co-evaluation              AM-PAC PT 6 Clicks Mobility   Outcome Measure  Help needed turning from your back to your side while in a flat bed without using bedrails?: A Little Help needed moving from lying on your back to sitting on the side of a flat bed without using bedrails?: A Little Help needed moving to and from a bed to a chair (including a wheelchair)?: A Little Help needed standing up from a chair using your arms (e.g., wheelchair or bedside chair)?: A Little Help needed to walk in hospital room?: A Little Help needed climbing 3-5 steps with a railing? : A Little 6 Click Score: 18    End of Session Equipment Utilized During Treatment: Gait belt Activity Tolerance: Patient tolerated treatment well Patient left: in chair;with call bell/phone within reach Nurse Communication: Mobility status PT Visit Diagnosis: Other abnormalities of gait and mobility (R26.89);Muscle weakness (generalized) (M62.81)     Time: 8662-8588 PT Time Calculation (min) (ACUTE ONLY): 34 min  Charges:    $Gait Training: 8-22 mins $Therapeutic Exercise: 8-22 mins PT General Charges $$ ACUTE PT VISIT: 1 Visit                     Amy [name], PT, DPT Acute Rehabilitation Services Secure Chat Preferred Office: [phone]    Amy [name] 11/11/2023, 2:32 PM

## 2023-11-11 NOTE — Care Management Obs Status (Signed)
 MEDICARE OBSERVATION STATUS NOTIFICATION   Patient Details  Name: Amy [name] MRN: [id number] Date of Birth: August 30, 1950   Medicare Observation Status Notification Given:  Yes    Jon Cruel 11/11/2023, 12:03 PM

## 2023-11-11 NOTE — Progress Notes (Signed)
 Subjective: 1 Day Post-Op Procedure(s) (LRB): ARTHROPLASTY, HIP, TOTAL, ANTERIOR APPROACH (Right) Patient reports pain as moderate.    Objective: Vital signs in last 24 hours: Temp:  [97.6 F (36.4 C)-98.9 F (37.2 C)] 98.8 F (37.1 C) (07/02 0750) Pulse Rate:  [60-90] 88 (07/02 0750) Resp:  [10-22] 17 (07/02 0750) BP: (108-158)/(59-90) 108/59 (07/02 0750) SpO2:  [90 %-99 %] 97 % (07/02 0750) Weight:  [105.7 kg] 105.7 kg (07/01 0811)  Intake/Output from previous day: 07/01 0701 - 07/02 0700 In: 1480 [P.O.:480; I.V.:900; IV Piggyback:100] Out: 1775 [Urine:1700; Blood:75] Intake/Output this shift: No intake/output data recorded.  No results for input(s): HGB in the last 72 hours. No results for input(s): WBC, RBC, HCT, PLT in the last 72 hours. No results for input(s): NA, K, CL, CO2, BUN, CREATININE, GLUCOSE, CALCIUM in the last 72 hours. No results for input(s): LABPT, INR in the last 72 hours.  Sensation intact distally Intact pulses distally Dorsiflexion/Plantar flexion intact Incision: scant drainage   Assessment/Plan: 1 Day Post-Op Procedure(s) (LRB): ARTHROPLASTY, HIP, TOTAL, ANTERIOR APPROACH (Right) Up with therapy Discharge home with home health      Amy [name] Poli 11/11/2023, 7:56 AM

## 2023-11-11 NOTE — Progress Notes (Signed)
 Physical Therapy Treatment  Patient Details Name: Amy [name] MRN: [id number] DOB: 1950-10-31 Today's Date: 11/11/2023   History of Present Illness 73 y.o. female presents to [facility] hospital on 11/10/2023 for elective R THA. PMH includes diabetes, asthma.    PT Comments  Pt progressing towards physical therapy goals. Pt reports nausea and pain this morning, with difficulty initiation active movement during functional mobility. Pt reports feeling like her hip is loosening up with ambulation, however would benefit from further gait and functional mobility training. Will continue to follow and progress as able per POC.    If plan is discharge home, recommend the following: A little help with bathing/dressing/bathroom;Assistance with cooking/housework;Assist for transportation;Help with stairs or ramp for entrance   Can travel by private vehicle        Equipment Recommendations  Rolling walker (2 wheels);BSC/3in1    Recommendations for Other Services       Precautions / Restrictions Precautions Precautions: Fall Recall of Precautions/Restrictions: Intact Precaution/Restrictions Comments: direct anterior THA Restrictions Weight Bearing Restrictions Per Provider Order: Yes RLE Weight Bearing Per Provider Order: Weight bearing as tolerated     Mobility  Bed Mobility               General bed mobility comments: Pt was received sitting up in recliner.    Transfers Overall transfer level: Needs assistance Equipment used: Rolling walker (2 wheels) Transfers: Sit to/from Stand Sit to Stand: Contact guard assist           General transfer comment: VC's for hand placement on seated surface for safety. Encouraged pt to extend RLE out prior to sitting for pain control.    Ambulation/Gait Ambulation/Gait assistance: Contact guard assist Gait Distance (Feet): 75 Feet Assistive device: Rolling walker (2 wheels) Gait Pattern/deviations: Step-to pattern Gait velocity:  Decreased Gait velocity interpretation: <1.31 ft/sec, indicative of household ambulator   General Gait Details: VC's for improved posture, increased heel strike and sequencing with the RW for support.   Stairs             Wheelchair Mobility     Tilt Bed    Modified Rankin (Stroke Patients Only)       Balance Overall balance assessment: Needs assistance Sitting-balance support: No upper extremity supported, Feet supported Sitting balance-Leahy Scale: Good     Standing balance support: Bilateral upper extremity supported, Reliant on assistive device for balance Standing balance-Leahy Scale: Poor                              Communication Communication Communication: No apparent difficulties  Cognition Arousal: Alert Behavior During Therapy: WFL for tasks assessed/performed   PT - Cognitive impairments: No apparent impairments                         Following commands: Intact      Cueing Cueing Techniques: Verbal cues  Exercises General Exercises - Lower Extremity Ankle Circles/Pumps: 10 reps Long Arc Quad: 10 reps, AAROM (Eccentric lower)    General Comments        Pertinent Vitals/Pain Pain Assessment Pain Assessment: Faces Faces Pain Scale: Hurts little more Pain Location: R hip Pain Descriptors / Indicators: Sore Pain Intervention(s): Limited activity within patient's tolerance, Monitored during session, Repositioned    Home Living  Prior Function            PT Goals (current goals can now be found in the care plan section) Acute Rehab PT Goals Patient Stated Goal: to return to independence PT Goal Formulation: With patient Time For Goal Achievement: 11/14/23 Potential to Achieve Goals: Good Progress towards PT goals: Progressing toward goals    Frequency    7X/week      PT Plan      Co-evaluation              AM-PAC PT 6 Clicks Mobility   Outcome Measure   Help needed turning from your back to your side while in a flat bed without using bedrails?: A Little Help needed moving from lying on your back to sitting on the side of a flat bed without using bedrails?: A Little Help needed moving to and from a bed to a chair (including a wheelchair)?: A Little Help needed standing up from a chair using your arms (e.g., wheelchair or bedside chair)?: A Little Help needed to walk in hospital room?: A Little Help needed climbing 3-5 steps with a railing? : A Little 6 Click Score: 18    End of Session Equipment Utilized During Treatment: Gait belt Activity Tolerance: Patient tolerated treatment well Patient left: in chair;with call bell/phone within reach Nurse Communication: Mobility status PT Visit Diagnosis: Other abnormalities of gait and mobility (R26.89);Muscle weakness (generalized) (M62.81)     Time: 9145-9085 PT Time Calculation (min) (ACUTE ONLY): 20 min  Charges:    $Gait Training: 8-22 mins PT General Charges $$ ACUTE PT VISIT: 1 Visit                     Amy [name], PT, DPT Acute Rehabilitation Services Secure Chat Preferred Office: [phone]    Amy [name] 11/11/2023, 10:25 AM

## 2023-11-11 NOTE — Telephone Encounter (Signed)
 Received voicemail from [place], PT with [facility]. He was scheduling patient for first home health visit tomorrow, and she stated that she could not do it, that she had drainage from her incision, the incision feels hot and her leg has purplish color. He advised her to call the office or go to the ED and she told him she preferred he call. CB for Arley is [phone].  I called patient.  Her total hip surgery was done yesterday and she discharged from the hospital at 2:45 this afternoon.  She states that she was walking really well and getting around good, however, started to have trouble prior to discharge. She says that her leg feels warm and is a purplish color.  She is concerned about not being able to use her leg that much now.  I spoke with Dr. Vernetta and called patient back.  He states that this is normal and that she just needs to take it easy.  She expressed understanding.   I also left voicemail for Arley advising.

## 2023-11-12 DIAGNOSIS — Z87891 Personal history of nicotine dependence: Secondary | ICD-10-CM | POA: Diagnosis not present

## 2023-11-12 DIAGNOSIS — K649 Unspecified hemorrhoids: Secondary | ICD-10-CM | POA: Diagnosis not present

## 2023-11-12 DIAGNOSIS — Z860100 Personal history of colon polyps, unspecified: Secondary | ICD-10-CM | POA: Diagnosis not present

## 2023-11-12 DIAGNOSIS — Z96641 Presence of right artificial hip joint: Secondary | ICD-10-CM | POA: Diagnosis not present

## 2023-11-12 DIAGNOSIS — H269 Unspecified cataract: Secondary | ICD-10-CM | POA: Diagnosis not present

## 2023-11-12 DIAGNOSIS — J4520 Mild intermittent asthma, uncomplicated: Secondary | ICD-10-CM | POA: Diagnosis not present

## 2023-11-12 DIAGNOSIS — Z471 Aftercare following joint replacement surgery: Secondary | ICD-10-CM | POA: Diagnosis not present

## 2023-11-12 DIAGNOSIS — R9431 Abnormal electrocardiogram [ECG] [EKG]: Secondary | ICD-10-CM | POA: Diagnosis not present

## 2023-11-12 DIAGNOSIS — Z7984 Long term (current) use of oral hypoglycemic drugs: Secondary | ICD-10-CM | POA: Diagnosis not present

## 2023-11-12 DIAGNOSIS — E119 Type 2 diabetes mellitus without complications: Secondary | ICD-10-CM | POA: Diagnosis not present

## 2023-11-12 DIAGNOSIS — Z7982 Long term (current) use of aspirin: Secondary | ICD-10-CM | POA: Diagnosis not present

## 2023-11-12 DIAGNOSIS — Z9181 History of falling: Secondary | ICD-10-CM | POA: Diagnosis not present

## 2023-11-12 DIAGNOSIS — Z87442 Personal history of urinary calculi: Secondary | ICD-10-CM | POA: Diagnosis not present

## 2023-11-12 DIAGNOSIS — K5900 Constipation, unspecified: Secondary | ICD-10-CM | POA: Diagnosis not present

## 2023-11-15 DIAGNOSIS — Z7984 Long term (current) use of oral hypoglycemic drugs: Secondary | ICD-10-CM | POA: Diagnosis not present

## 2023-11-15 DIAGNOSIS — Z471 Aftercare following joint replacement surgery: Secondary | ICD-10-CM | POA: Diagnosis not present

## 2023-11-15 DIAGNOSIS — H269 Unspecified cataract: Secondary | ICD-10-CM | POA: Diagnosis not present

## 2023-11-15 DIAGNOSIS — K5900 Constipation, unspecified: Secondary | ICD-10-CM | POA: Diagnosis not present

## 2023-11-15 DIAGNOSIS — R9431 Abnormal electrocardiogram [ECG] [EKG]: Secondary | ICD-10-CM | POA: Diagnosis not present

## 2023-11-15 DIAGNOSIS — Z860100 Personal history of colon polyps, unspecified: Secondary | ICD-10-CM | POA: Diagnosis not present

## 2023-11-15 DIAGNOSIS — J4520 Mild intermittent asthma, uncomplicated: Secondary | ICD-10-CM | POA: Diagnosis not present

## 2023-11-15 DIAGNOSIS — K649 Unspecified hemorrhoids: Secondary | ICD-10-CM | POA: Diagnosis not present

## 2023-11-15 DIAGNOSIS — Z87891 Personal history of nicotine dependence: Secondary | ICD-10-CM | POA: Diagnosis not present

## 2023-11-15 DIAGNOSIS — E119 Type 2 diabetes mellitus without complications: Secondary | ICD-10-CM | POA: Diagnosis not present

## 2023-11-15 DIAGNOSIS — Z87442 Personal history of urinary calculi: Secondary | ICD-10-CM | POA: Diagnosis not present

## 2023-11-15 DIAGNOSIS — Z9181 History of falling: Secondary | ICD-10-CM | POA: Diagnosis not present

## 2023-11-15 DIAGNOSIS — Z96641 Presence of right artificial hip joint: Secondary | ICD-10-CM | POA: Diagnosis not present

## 2023-11-15 DIAGNOSIS — Z7982 Long term (current) use of aspirin: Secondary | ICD-10-CM | POA: Diagnosis not present

## 2023-11-16 ENCOUNTER — Telehealth: Payer: Self-pay | Admitting: Orthopaedic Surgery

## 2023-11-16 DIAGNOSIS — K5900 Constipation, unspecified: Secondary | ICD-10-CM | POA: Diagnosis not present

## 2023-11-16 DIAGNOSIS — Z471 Aftercare following joint replacement surgery: Secondary | ICD-10-CM | POA: Diagnosis not present

## 2023-11-16 DIAGNOSIS — Z9181 History of falling: Secondary | ICD-10-CM | POA: Diagnosis not present

## 2023-11-16 DIAGNOSIS — Z860100 Personal history of colon polyps, unspecified: Secondary | ICD-10-CM | POA: Diagnosis not present

## 2023-11-16 DIAGNOSIS — R9431 Abnormal electrocardiogram [ECG] [EKG]: Secondary | ICD-10-CM | POA: Diagnosis not present

## 2023-11-16 DIAGNOSIS — J4520 Mild intermittent asthma, uncomplicated: Secondary | ICD-10-CM | POA: Diagnosis not present

## 2023-11-16 DIAGNOSIS — K649 Unspecified hemorrhoids: Secondary | ICD-10-CM | POA: Diagnosis not present

## 2023-11-16 DIAGNOSIS — Z7982 Long term (current) use of aspirin: Secondary | ICD-10-CM | POA: Diagnosis not present

## 2023-11-16 DIAGNOSIS — Z96641 Presence of right artificial hip joint: Secondary | ICD-10-CM | POA: Diagnosis not present

## 2023-11-16 DIAGNOSIS — Z7984 Long term (current) use of oral hypoglycemic drugs: Secondary | ICD-10-CM | POA: Diagnosis not present

## 2023-11-16 DIAGNOSIS — E119 Type 2 diabetes mellitus without complications: Secondary | ICD-10-CM | POA: Diagnosis not present

## 2023-11-16 DIAGNOSIS — Z87442 Personal history of urinary calculi: Secondary | ICD-10-CM | POA: Diagnosis not present

## 2023-11-16 DIAGNOSIS — H269 Unspecified cataract: Secondary | ICD-10-CM | POA: Diagnosis not present

## 2023-11-16 DIAGNOSIS — Z87891 Personal history of nicotine dependence: Secondary | ICD-10-CM | POA: Diagnosis not present

## 2023-11-16 NOTE — Telephone Encounter (Signed)
 Patient called. She would like to know what type of bandages she needs to use? Can she come by to pick some up or what to buy? Her cb# [phone]

## 2023-11-16 NOTE — Telephone Encounter (Signed)
 Patient aware she can use just dry guaze

## 2023-11-18 DIAGNOSIS — K649 Unspecified hemorrhoids: Secondary | ICD-10-CM | POA: Diagnosis not present

## 2023-11-18 DIAGNOSIS — Z7984 Long term (current) use of oral hypoglycemic drugs: Secondary | ICD-10-CM | POA: Diagnosis not present

## 2023-11-18 DIAGNOSIS — J4520 Mild intermittent asthma, uncomplicated: Secondary | ICD-10-CM | POA: Diagnosis not present

## 2023-11-18 DIAGNOSIS — Z7982 Long term (current) use of aspirin: Secondary | ICD-10-CM | POA: Diagnosis not present

## 2023-11-18 DIAGNOSIS — Z471 Aftercare following joint replacement surgery: Secondary | ICD-10-CM | POA: Diagnosis not present

## 2023-11-18 DIAGNOSIS — Z87442 Personal history of urinary calculi: Secondary | ICD-10-CM | POA: Diagnosis not present

## 2023-11-18 DIAGNOSIS — Z96641 Presence of right artificial hip joint: Secondary | ICD-10-CM | POA: Diagnosis not present

## 2023-11-18 DIAGNOSIS — K5900 Constipation, unspecified: Secondary | ICD-10-CM | POA: Diagnosis not present

## 2023-11-18 DIAGNOSIS — E119 Type 2 diabetes mellitus without complications: Secondary | ICD-10-CM | POA: Diagnosis not present

## 2023-11-18 DIAGNOSIS — Z9181 History of falling: Secondary | ICD-10-CM | POA: Diagnosis not present

## 2023-11-18 DIAGNOSIS — Z87891 Personal history of nicotine dependence: Secondary | ICD-10-CM | POA: Diagnosis not present

## 2023-11-18 DIAGNOSIS — Z860100 Personal history of colon polyps, unspecified: Secondary | ICD-10-CM | POA: Diagnosis not present

## 2023-11-18 DIAGNOSIS — R9431 Abnormal electrocardiogram [ECG] [EKG]: Secondary | ICD-10-CM | POA: Diagnosis not present

## 2023-11-18 DIAGNOSIS — H269 Unspecified cataract: Secondary | ICD-10-CM | POA: Diagnosis not present

## 2023-11-20 DIAGNOSIS — K649 Unspecified hemorrhoids: Secondary | ICD-10-CM | POA: Diagnosis not present

## 2023-11-20 DIAGNOSIS — J4520 Mild intermittent asthma, uncomplicated: Secondary | ICD-10-CM | POA: Diagnosis not present

## 2023-11-20 DIAGNOSIS — Z7982 Long term (current) use of aspirin: Secondary | ICD-10-CM | POA: Diagnosis not present

## 2023-11-20 DIAGNOSIS — Z87442 Personal history of urinary calculi: Secondary | ICD-10-CM | POA: Diagnosis not present

## 2023-11-20 DIAGNOSIS — R9431 Abnormal electrocardiogram [ECG] [EKG]: Secondary | ICD-10-CM | POA: Diagnosis not present

## 2023-11-20 DIAGNOSIS — Z96641 Presence of right artificial hip joint: Secondary | ICD-10-CM | POA: Diagnosis not present

## 2023-11-20 DIAGNOSIS — Z9181 History of falling: Secondary | ICD-10-CM | POA: Diagnosis not present

## 2023-11-20 DIAGNOSIS — E119 Type 2 diabetes mellitus without complications: Secondary | ICD-10-CM | POA: Diagnosis not present

## 2023-11-20 DIAGNOSIS — H269 Unspecified cataract: Secondary | ICD-10-CM | POA: Diagnosis not present

## 2023-11-20 DIAGNOSIS — Z7984 Long term (current) use of oral hypoglycemic drugs: Secondary | ICD-10-CM | POA: Diagnosis not present

## 2023-11-20 DIAGNOSIS — K5900 Constipation, unspecified: Secondary | ICD-10-CM | POA: Diagnosis not present

## 2023-11-20 DIAGNOSIS — Z87891 Personal history of nicotine dependence: Secondary | ICD-10-CM | POA: Diagnosis not present

## 2023-11-20 DIAGNOSIS — Z471 Aftercare following joint replacement surgery: Secondary | ICD-10-CM | POA: Diagnosis not present

## 2023-11-20 DIAGNOSIS — Z860100 Personal history of colon polyps, unspecified: Secondary | ICD-10-CM | POA: Diagnosis not present

## 2023-11-23 ENCOUNTER — Ambulatory Visit (INDEPENDENT_AMBULATORY_CARE_PROVIDER_SITE_OTHER): Admitting: Orthopaedic Surgery

## 2023-11-23 DIAGNOSIS — H269 Unspecified cataract: Secondary | ICD-10-CM | POA: Diagnosis not present

## 2023-11-23 DIAGNOSIS — Z87442 Personal history of urinary calculi: Secondary | ICD-10-CM | POA: Diagnosis not present

## 2023-11-23 DIAGNOSIS — K649 Unspecified hemorrhoids: Secondary | ICD-10-CM | POA: Diagnosis not present

## 2023-11-23 DIAGNOSIS — K5900 Constipation, unspecified: Secondary | ICD-10-CM | POA: Diagnosis not present

## 2023-11-23 DIAGNOSIS — Z7982 Long term (current) use of aspirin: Secondary | ICD-10-CM | POA: Diagnosis not present

## 2023-11-23 DIAGNOSIS — J4520 Mild intermittent asthma, uncomplicated: Secondary | ICD-10-CM | POA: Diagnosis not present

## 2023-11-23 DIAGNOSIS — E119 Type 2 diabetes mellitus without complications: Secondary | ICD-10-CM | POA: Diagnosis not present

## 2023-11-23 DIAGNOSIS — Z96641 Presence of right artificial hip joint: Secondary | ICD-10-CM

## 2023-11-23 DIAGNOSIS — Z471 Aftercare following joint replacement surgery: Secondary | ICD-10-CM | POA: Diagnosis not present

## 2023-11-23 DIAGNOSIS — Z7984 Long term (current) use of oral hypoglycemic drugs: Secondary | ICD-10-CM | POA: Diagnosis not present

## 2023-11-23 DIAGNOSIS — Z9181 History of falling: Secondary | ICD-10-CM | POA: Diagnosis not present

## 2023-11-23 DIAGNOSIS — Z87891 Personal history of nicotine dependence: Secondary | ICD-10-CM | POA: Diagnosis not present

## 2023-11-23 DIAGNOSIS — R9431 Abnormal electrocardiogram [ECG] [EKG]: Secondary | ICD-10-CM | POA: Diagnosis not present

## 2023-11-23 DIAGNOSIS — Z860100 Personal history of colon polyps, unspecified: Secondary | ICD-10-CM | POA: Diagnosis not present

## 2023-11-23 MED ORDER — OXYCODONE HCL 5 MG PO TABS: 5.0000 mg | ORAL_TABLET | Freq: Four times a day (QID) | ORAL | 0 refills | Status: AC | PRN

## 2023-11-23 NOTE — Progress Notes (Signed)
 The patient is here today for first postoperative visit status post a right total hip replacement to treat her significant right hip pain and arthritis.  She ha was here back in a month to see how she is doing overall but no x-rays are needed.  s been ambulating with a walker.  She says therapy is work with her minimally but she is doing well.  Her right hip incision is good.  Staples are removed and Steri-Strips applied.  Calf is soft.  She was only taking her aspirin  once a day and did not realize she should take it twice a day.  There was some confusion there.  She will slowly increase her activities as comfort allows.  I will refill her oxycodone .  She knows not to drive when she has that in her system but does get back to driving as she feels comfortable.  She should transition from a walker to a cane and right now we are holding off on any outpatient therapy unless she ends up needing it and she will let us  know.

## 2023-11-25 ENCOUNTER — Other Ambulatory Visit: Payer: Self-pay

## 2023-11-25 ENCOUNTER — Telehealth: Payer: Self-pay | Admitting: Orthopaedic Surgery

## 2023-11-25 DIAGNOSIS — R9431 Abnormal electrocardiogram [ECG] [EKG]: Secondary | ICD-10-CM | POA: Diagnosis not present

## 2023-11-25 DIAGNOSIS — Z96641 Presence of right artificial hip joint: Secondary | ICD-10-CM

## 2023-11-25 DIAGNOSIS — E119 Type 2 diabetes mellitus without complications: Secondary | ICD-10-CM | POA: Diagnosis not present

## 2023-11-25 DIAGNOSIS — J4520 Mild intermittent asthma, uncomplicated: Secondary | ICD-10-CM | POA: Diagnosis not present

## 2023-11-25 DIAGNOSIS — K649 Unspecified hemorrhoids: Secondary | ICD-10-CM | POA: Diagnosis not present

## 2023-11-25 DIAGNOSIS — Z87891 Personal history of nicotine dependence: Secondary | ICD-10-CM | POA: Diagnosis not present

## 2023-11-25 DIAGNOSIS — Z7984 Long term (current) use of oral hypoglycemic drugs: Secondary | ICD-10-CM | POA: Diagnosis not present

## 2023-11-25 DIAGNOSIS — Z471 Aftercare following joint replacement surgery: Secondary | ICD-10-CM | POA: Diagnosis not present

## 2023-11-25 DIAGNOSIS — Z87442 Personal history of urinary calculi: Secondary | ICD-10-CM | POA: Diagnosis not present

## 2023-11-25 DIAGNOSIS — Z7982 Long term (current) use of aspirin: Secondary | ICD-10-CM | POA: Diagnosis not present

## 2023-11-25 DIAGNOSIS — Z860100 Personal history of colon polyps, unspecified: Secondary | ICD-10-CM | POA: Diagnosis not present

## 2023-11-25 DIAGNOSIS — Z9181 History of falling: Secondary | ICD-10-CM | POA: Diagnosis not present

## 2023-11-25 DIAGNOSIS — K5900 Constipation, unspecified: Secondary | ICD-10-CM | POA: Diagnosis not present

## 2023-11-25 DIAGNOSIS — H269 Unspecified cataract: Secondary | ICD-10-CM | POA: Diagnosis not present

## 2023-11-25 NOTE — Telephone Encounter (Signed)
 Patient called. Says she wold like to have PT. Would like to come here.

## 2023-12-01 ENCOUNTER — Ambulatory Visit: Admitting: Physical Therapy

## 2023-12-01 ENCOUNTER — Encounter: Payer: Self-pay | Admitting: Physical Therapy

## 2023-12-01 DIAGNOSIS — M6281 Muscle weakness (generalized): Secondary | ICD-10-CM

## 2023-12-01 DIAGNOSIS — M25651 Stiffness of right hip, not elsewhere classified: Secondary | ICD-10-CM | POA: Diagnosis not present

## 2023-12-01 DIAGNOSIS — R293 Abnormal posture: Secondary | ICD-10-CM | POA: Diagnosis not present

## 2023-12-01 DIAGNOSIS — M25551 Pain in right hip: Secondary | ICD-10-CM

## 2023-12-01 DIAGNOSIS — R2681 Unsteadiness on feet: Secondary | ICD-10-CM

## 2023-12-01 DIAGNOSIS — R2689 Other abnormalities of gait and mobility: Secondary | ICD-10-CM | POA: Diagnosis not present

## 2023-12-01 NOTE — Therapy (Addendum)
 OUTPATIENT PHYSICAL THERAPY LOWER EXTREMITY EVALUATION   Patient Name: Amy [name] MRN: [id number] DOB:06/25/1950, 73 y.o., female Today's Date: 12/01/2023  END OF SESSION:  PT End of Session - 12/01/23 1102     Visit Number 1    Number of Visits 8    Date for PT Re-Evaluation 01/26/24    Authorization Type [employer]    Authorization Time Period $20 copay    PT Start Time 1018    PT Stop Time 1057    PT Time Calculation (min) 39 min    Activity Tolerance Patient tolerated treatment well    Behavior During Therapy WFL for tasks assessed/performed          Past Medical History:  Diagnosis Date   Arthritis    Right Hip   Colon polyp, hyperplastic 03/29/2010   Diabetes mellitus    History of kidney stones    Mild intermittent asthma    Pt denies   Past Surgical History:  Procedure Laterality Date   COLONOSCOPY     CYST EXCISION Left 15 years ago   Axillary    ECTOPIC PREGNANCY SURGERY     OVARIAN CYST REMOVAL  35 years ago   TOTAL ABDOMINAL HYSTERECTOMY  1984   TOTAL HIP ARTHROPLASTY Right 11/10/2023   Procedure: ARTHROPLASTY, HIP, TOTAL, ANTERIOR APPROACH;  Surgeon: Vernetta Lonni GRADE, MD;  Location: [facility] OR;  Service: Orthopedics;  Laterality: Right;   Patient Active Problem List   Diagnosis Date Noted   Status post total replacement of right hip 11/10/2023   Hemorrhoids 12/18/2020   Abnormal EKG 04/27/2019   Mild intermittent asthma 10/26/2017   Morbid (severe) obesity due to excess calories (HCC) 09/29/2016   Diabetes (HCC) 08/10/2013   History of kidney stones    Colon polyp, hyperplastic 03/29/2010    PCP: Purcell Emil Schanz, MD  REFERRING PROVIDER: Vernetta Lonni GRADE*  REFERRING DIAG: S03.358 (ICD-10-CM) - Status post total replacement of right hip   THERAPY DIAG:  Muscle weakness (generalized)  Other abnormalities of gait and mobility  Stiffness of right hip, not elsewhere classified  Unsteadiness on feet  Abnormal  posture  Pain in right hip  Rationale for Evaluation and Treatment: Rehabilitation  ONSET DATE: DOS: 11/10/2023  SUBJECTIVE:   SUBJECTIVE STATEMENT: Patient is a 73 year old female whom reports to PT, post total replacement of right hip. Patient has had home health physical therapy but feels she needs more to return to full function so she requested outpatient PT. Before surgery was not able to drive due to pressing pedals, had difficulty getting in car, and putting on pants. After surgery, patient has been doing overall well with movement within precaution.  She is able to put on her pants and step over tub to shower.  PERTINENT HISTORY: Arthritis, DM, mild intermittent asthma,   DIAGNOSTIC FINDINGS:  DG Pelvis Portable  FINDINGS: Right hip arthroplasty in expected alignment. No periprosthetic lucency or fracture. Recent postsurgical change includes air and edema in the soft tissues. Overlying skin staples in place.   IMPRESSION: Right hip arthroplasty without immediate postoperative complication.  PAIN:  NPRS scale: 2/10 at rest, 0/10 walking and standing, 0/10 when sleeping Pain location: anterior Rt hip, scar  Pain description: soreness, dull, achey  Aggravating factors: with movement Relieving factors: prescription pain medication, muscle relaxer icing, warm pack  PRECAUTIONS: Anterior hip, no forceful extension  WEIGHT BEARING RESTRICTIONS: No  FALLS:  Has patient fallen in last 6 months? No  LIVING ENVIRONMENT: Lives with: lives  with their spouse Lives in: House/apartment Stairs: Yes: External: 1 steps; none Has following equipment at home: Single point cane, Walker - 2 wheeled, shower chair, and Grab bars  OCCUPATION: retired   PLOF: indpendent but with pain  PATIENT GOALS: walking and standing wihtout pain, gardening, community activities  Next MD visit: Post-op Visit: 12/21/2023  OBJECTIVE:   PATIENT SURVEYS:  Patient-Specific Activity Scoring  Scheme  0 represents "unable to perform." 10 represents "able to perform at prior level. 0 1 2 3 4 5 6 7 8 9  10 (Date and Score)  Activity Eval     1. Walking 4     2. Standing  2     3. Driving 0   4. Stairs  0   5.    Score 1.5    Total score = sum of the activity scores/number of activities Minimum detectable change (90%CI) for average score = 2 points Minimum detectable change (90%CI) for single activity score = 3 points  COGNITION: Overall cognitive status: WFL    SENSATION: WFL  EDEMA: not tested  MUSCLE LENGTH: not tested   POSTURE:  rounded shoulders, forward head, increased lumbar lordosis, posterior pelvic tilt, flexed trunk , and weight shift left, stands with 12* of hip flexion  PALPATION: not tested   LOWER EXTREMITY ROM:   ROM Right eval  Hip flexion RW support Standing AROM: 53*  Hip extension RW support Standing AROM: moved 4* from her standing position (-12* to -8*)  Hip abduction RW support Standing AROM: 21*  Hip adduction RW support Standing AROM: 14*  Hip internal rotation Seated AROM: 9*  Hip external rotation Seated AROM: 12*  Knee flexion   Knee extension   Ankle dorsiflexion   Ankle plantarflexion   Ankle inversion   Ankle eversion    (Blank rows = not tested) At eval: done with walker support for balance  LOWER EXTREMITY MMT:  MMT Right eval  Hip flexion 3-/5  Hip extension 3-/5  Hip abduction 3-/5  Hip adduction 3-/5  Hip internal rotation 3-/5  Hip external rotation 3-/5  Knee flexion   Knee extension   Ankle dorsiflexion   Ankle plantarflexion   Ankle inversion   Ankle eversion    (Blank rows = not tested)  LOWER EXTREMITY SPECIAL TESTS: not tested  FUNCTIONAL TESTS:  18 inch chair transfer: requires BUE support on armrests and Wt Shift to left  5x STS: 20.31 seconds with UE assistance, Wt Shift to left Lt SLS: 3.68 seconds Rt SLS: unable to stand on Rt / only able to lift LLE off floor on 5th attempt for  split second  GAIT: Distance walked: 100 x 2 Assistive device utilized: Single point cane and Walker - 2 wheeled Level of assistance: Modified independence with RW & CGA with cane Comments: SPC: short step length, flexed trunk, lack of heel contact on right foot, and weight shifted on left side Gait speed: 0.56 feet/sec with cane stand alone tip Walker: Flex trunk and weight shifted on the left side  TODAY'S TREATMENT                                                                          DATE: 12/01/2023 Self-Care: PT demo and educated patient about exercises that mimic driving to slowly progress to going back to driving.PT recommended trying to sit behind wheel of running car in [place] and move RLE gas to/from brake.  Increase speed as tolerated up to a sudden stop to prevent rear-ending someone.  Patient verbalized understanding PT educated patient about single-point cane usage, height, and about using it around the house and not community yet.  Patient verbalized understanding  PATIENT EDUCATION:  Education details: POC Person educated: Patient Education method: Solicitor, and Verbal cues Education comprehension: verbalized understanding, returned demonstration, and verbal cues required  HOME EXERCISE PROGRAM:   ASSESSMENT:  CLINICAL IMPRESSION: Patient is a 73 y.o. who comes to clinic status post total replacement of right hip pain with mobility, strength and movement coordination deficits that impair their ability to perform usual daily and recreational functional activities without increase difficulty/symptoms at this time.  Patient to benefit from skilled PT services to address impairments and limitations to improve to previous level of function without restriction secondary to condition.    OBJECTIVE IMPAIRMENTS: Abnormal gait, decreased activity tolerance, decreased balance, decreased endurance, decreased knowledge of use of DME, decreased mobility, difficulty walking, decreased ROM, decreased strength, impaired flexibility, postural dysfunction, and pain.   ACTIVITY LIMITATIONS: carrying, lifting, bending, sitting, standing, squatting, stairs, transfers, bathing, toileting, and locomotion level  PARTICIPATION LIMITATIONS: meal prep, cleaning, driving, shopping, and community activity  PERSONAL FACTORS: Age, Fitness, and 1-2 comorbidities: see PMH are also affecting patient's functional outcome.   REHAB POTENTIAL: Good  CLINICAL DECISION MAKING: Stable/uncomplicated  EVALUATION COMPLEXITY: Low   GOALS: Goals reviewed with patient? Yes  SHORT TERM GOALS: (target date for Short term goals 12/24/2023)   1.  Patient will demonstrate independent use of home exercise program to maintain progress from in clinic treatments.  Goal status: New  LONG TERM GOALS: (target dates for all long term goals  01/21/2024 )   1. Patient will demonstrate/report pain at worst less than or equal to 1/10 to facilitate minimal limitation in daily activity secondary to pain symptoms. Goal status: New   2. Patient will demonstrate independent use of home exercise program to facilitate ability to maintain/progress functional gains from skilled physical therapy services. Goal status: New   3. Patient will demonstrate Patient specific functional scale avg > or = 5 to indicate reduced disability due to condition.  Goal status: New   4.  Patient will demonstrate Rt hip MMT >4/5 throughout to faciltiate usual transfers, stairs, squatting at [facility] for daily life.  Goal status: New   5.  Patient will demonstrate ability to ambulate with LRAD around the community, including negotiating ramps and curbs. Goal status: New   6.  Patient will demonstrate right hip AROM WFLs for ADLs and community  work. Goal status: New  PLAN:  PT FREQUENCY: 1 x/week  PT DURATION:  8 weeks  PLANNED INTERVENTIONS: Can include 02853- PT Re-evaluation, 97110-Therapeutic exercises, 97530- Therapeutic activity, [id number]- Neuromuscular re-education, 97535- Self Care, 97140- Manual therapy, [id number]- Gait training, [phone]- Orthotic Fit/training,  04007- Canalith repositioning, 02886- Aquatic Therapy, [id number]- Electrical stimulation (unattended), [id number] Physical performance testing, 02983- Vasopneumatic device, [id number]- Ultrasound, [id number]- Traction (mechanical), [id number]- Ionotophoresis 4mg /ml Dexamethasone ,  79439 - Needle insertion w/o injection 1 or 2 muscles, 20561 - Needle insertion w/o injection 3 or more muscles.   Patient/Family education, Balance training, Stair training, Taping, Dry Needling, Joint mobilization, Joint manipulation, Spinal manipulation, Spinal mobilization, Scar mobilization, Vestibular training, Visual/preceptual remediation/compensation, DME instructions, Cryotherapy, and Moist heat.  All performed as medically necessary.  All included unless contraindicated  PLAN FOR NEXT SESSION:Create HEP that includes strengthening, mobility, and balance exercises for right hip.  Ambulate w/ single-point cane and instruct in negotiating ramps and curbs   Ismael Nap, Student-PT, DPT 12/01/2023, 11:38 AM  This entire session of physical therapy was performed under the direct supervision of PT signing evaluation /treatment. PT reviewed note and agrees.   Grayce Spatz, PT, DPT 12/01/2023, 1:25 PM   Date of referral: 11/25/2023 Referring provider: Vernetta Lonni GRADE*  Referring diagnosis?   S03.358 (ICD-10-CM) - Status post total replacement of right hip   Treatment diagnosis? (if different than referring diagnosis) M62.81, R26.89, M25.651, R26.81, R29.3, M25.551  What was this (referring dx) caused by? Surgery (Type: total replacement of R hip)  Nature of Condition: Initial Onset (within last 3  months)   Laterality: Rt  Current Functional Measure Score: Patient Specific Functional Scale 1.5  Objective measurements identify impairments when they are compared to normal values, the uninvolved extremity, and prior level of function.  [x]  Yes  []  No  Objective assessment of functional ability: Moderate functional limitations   Briefly describe symptoms: pain in Rt anterior hip in sitting, gait abnormalities, weakness and tightness in Rt hip, abnormal posture, difficulty with community activities.   How did symptoms start: surgery  Average pain intensity:  Last 24 hours: 2/10  Past week: 2/10  How often does the pt experience symptoms? Frequently  How much have the symptoms interfered with usual daily activities? Moderately  How has condition changed since care began at this facility? NA - initial visit  In general, how is the patients overall health? Good   BACK PAIN (STarT Back Screening Tool) No

## 2023-12-08 ENCOUNTER — Encounter: Payer: Self-pay | Admitting: Physical Therapy

## 2023-12-08 ENCOUNTER — Ambulatory Visit: Admitting: Physical Therapy

## 2023-12-08 DIAGNOSIS — R2689 Other abnormalities of gait and mobility: Secondary | ICD-10-CM

## 2023-12-08 DIAGNOSIS — R293 Abnormal posture: Secondary | ICD-10-CM

## 2023-12-08 DIAGNOSIS — M25551 Pain in right hip: Secondary | ICD-10-CM | POA: Diagnosis not present

## 2023-12-08 DIAGNOSIS — M25651 Stiffness of right hip, not elsewhere classified: Secondary | ICD-10-CM | POA: Diagnosis not present

## 2023-12-08 DIAGNOSIS — M6281 Muscle weakness (generalized): Secondary | ICD-10-CM | POA: Diagnosis not present

## 2023-12-08 DIAGNOSIS — R2681 Unsteadiness on feet: Secondary | ICD-10-CM | POA: Diagnosis not present

## 2023-12-08 NOTE — Therapy (Addendum)
 PHYSICAL THERAPY DISCHARGE SUMMARY  Visits from Start of Care: 2  Current functional level related to goals / functional outcomes: Unknown as did not return to PT   Remaining deficits: unknown   Education / Equipment: Patient was educated in initial HEP which she appeared to understand.   Patient agrees to discharge. Patient goals were not met. Patient is being discharged due to not returning since the last visit.   Grayce Spatz, PT, DPT 04/27/2024, 9:04 AM    OUTPATIENT PHYSICAL THERAPY LOWER EXTREMITY TREATMENT   Patient Name: Amy [name] MRN: [id number] DOB:July 21, 1950, 73 y.o., female Today's Date: 12/08/2023  END OF SESSION:  PT End of Session - 12/08/23 1109     Visit Number 2    Number of Visits 8    Date for PT Re-Evaluation 01/26/24    Authorization Type [employer]    Authorization Time Period $20 copay    PT Start Time 1100    PT Stop Time 1140    PT Time Calculation (min) 40 min    Activity Tolerance Patient tolerated treatment well    Behavior During Therapy WFL for tasks assessed/performed           Past Medical History:  Diagnosis Date   Arthritis    Right Hip   Colon polyp, hyperplastic 03/29/2010   Diabetes mellitus    History of kidney stones    Mild intermittent asthma    Pt denies   Past Surgical History:  Procedure Laterality Date   COLONOSCOPY     CYST EXCISION Left 15 years ago   Axillary    ECTOPIC PREGNANCY SURGERY     OVARIAN CYST REMOVAL  35 years ago   TOTAL ABDOMINAL HYSTERECTOMY  1984   TOTAL HIP ARTHROPLASTY Right 11/10/2023   Procedure: ARTHROPLASTY, HIP, TOTAL, ANTERIOR APPROACH;  Surgeon: Vernetta Lonni GRADE, MD;  Location: [facility] OR;  Service: Orthopedics;  Laterality: Right;   Patient Active Problem List   Diagnosis Date Noted   Status post total replacement of right hip 11/10/2023   Hemorrhoids 12/18/2020   Abnormal EKG 04/27/2019   Mild intermittent asthma 10/26/2017   Morbid (severe) obesity due to  excess calories (HCC) 09/29/2016   Diabetes (HCC) 08/10/2013   History of kidney stones    Colon polyp, hyperplastic 03/29/2010    PCP: Purcell Emil Schanz, MD  REFERRING PROVIDER: Vernetta Lonni GRADE*  REFERRING DIAG: S03.358 (ICD-10-CM) - Status post total replacement of right hip   THERAPY DIAG:  Muscle weakness (generalized)  Other abnormalities of gait and mobility  Stiffness of right hip, not elsewhere classified  Unsteadiness on feet  Pain in right hip  Abnormal posture  Rationale for Evaluation and Treatment: Rehabilitation  ONSET DATE: DOS: 11/10/2023  SUBJECTIVE:   SUBJECTIVE STATEMENT: She was able to drive without hip pain.  She had to take a spasm pill due to back spasm.    PERTINENT HISTORY: Arthritis, DM, mild intermittent asthma,   DIAGNOSTIC FINDINGS:  DG Pelvis Portable  FINDINGS: Right hip arthroplasty in expected alignment. No periprosthetic lucency or fracture. Recent postsurgical change includes air and edema in the soft tissues. Overlying skin staples in place.   IMPRESSION: Right hip arthroplasty without immediate postoperative complication.  PAIN:  NPRS scale:  1/10 at rest, 0/10 walking and standing, 0/10 when sleeping Pain location: anterior Rt hip, scar  Pain description: soreness, dull, achey  Aggravating factors: with movement Relieving factors: prescription pain medication, muscle relaxer icing, warm pack  PRECAUTIONS: Anterior hip, no  forceful extension  WEIGHT BEARING RESTRICTIONS: No  FALLS:  Has patient fallen in last 6 months? No  LIVING ENVIRONMENT: Lives with: lives with their spouse Lives in: House/apartment Stairs: Yes: External: 1 steps; none Has following equipment at home: Single point cane, Walker - 2 wheeled, shower chair, and Grab bars  OCCUPATION: retired   PLOF: indpendent but with pain  PATIENT GOALS: walking and standing wihtout pain, gardening, community activities  Next MD visit: Post-op  Visit: 12/21/2023  OBJECTIVE:   PATIENT SURVEYS:  Patient-Specific Activity Scoring Scheme  0 represents unable to perform. 10 represents able to perform at prior level. 0 1 2 3 4 5 6 7 8 9  10 (Date and Score)  Activity Eval     1. Walking 4     2. Standing  2     3. Driving 0   4. Stairs  0   5.    Score 1.5    Total score = sum of the activity scores/number of activities Minimum detectable change (90%CI) for average score = 2 points Minimum detectable change (90%CI) for single activity score = 3 points  COGNITION: Overall cognitive status: WFL    SENSATION: WFL  EDEMA: not tested  MUSCLE LENGTH: not tested   POSTURE:  rounded shoulders, forward head, increased lumbar lordosis, posterior pelvic tilt, flexed trunk , and weight shift left, stands with 12* of hip flexion  PALPATION: not tested   LOWER EXTREMITY ROM:   ROM Right eval  Hip flexion RW support Standing AROM: 53*  Hip extension RW support Standing AROM: moved 4* from her standing position (-12* to -8*)  Hip abduction RW support Standing AROM: 21*  Hip adduction RW support Standing AROM: 14*  Hip internal rotation Seated AROM: 9*  Hip external rotation Seated AROM: 12*  Knee flexion   Knee extension   Ankle dorsiflexion   Ankle plantarflexion   Ankle inversion   Ankle eversion    (Blank rows = not tested) At eval: done with walker support for balance  LOWER EXTREMITY MMT:  MMT Right eval  Hip flexion 3-/5  Hip extension 3-/5  Hip abduction 3-/5  Hip adduction 3-/5  Hip internal rotation 3-/5  Hip external rotation 3-/5  Knee flexion   Knee extension   Ankle dorsiflexion   Ankle plantarflexion   Ankle inversion   Ankle eversion    (Blank rows = not tested)  LOWER EXTREMITY SPECIAL TESTS: not tested  FUNCTIONAL TESTS:  18 inch chair transfer: requires BUE support on armrests and Wt Shift to left  5x STS: 20.31 seconds with UE assistance, Wt Shift to left Lt SLS: 3.68  seconds Rt SLS: unable to stand on Rt / only able to lift LLE off floor on 5th attempt for split second  GAIT: Distance walked: 100 x 2 Assistive device utilized: Single point cane and Walker - 2 wheeled Level of assistance: Modified independence with RW & CGA with cane Comments: SPC: short step length, flexed trunk, lack of heel contact on right foot, and weight shifted on left side Gait speed: 0.56 feet/sec with cane stand alone tip Walker: Flex trunk and weight shifted on the left side  TODAY'S TREATMENT                                                                          DATE: 12/08/2023 Therapeutic Exercise: Nustep seat 9 level 5 with BLEs & BUEs for 8 min. Fluency of RLE motion improved during 8 min.   PT educated on doing at [facility] with 10 min for 3 sets with 5 min rests.  Once this does not fatigue her, then start to reduce rest time until 30 min straight.  Pt verbalized understanding.   Supine Bridge with red Theraband Resistance Band 2 sets - 10 reps - 3 sec hold.  Clamshell with red Theraband  Resistance 2 sets - 10 reps - 3 sec hold Sidelying Reverse Clamshell  2 sets - 10 reps - 3 seconds hold Supine Heel Slide with Strap & Ball 2 sets - 10 reps - 3 seconds hold PT instructed in above exercises as HEP with HO, demo, verbal & tactile cues.  Pt verbalized understanding after completing during session.   Self-Care: PT verbally educated patient about desensitizing right hip by rubbing 5 min in shower.  Slowly over time increase amount of pressure.  Pt verbalized understanding.   Gait Training: Pt amb 100' X 2 with cane with cues on upright posture.  Pt had less antalgic gait today compared to evaluation.     TREATMENT                                                                          DATE:  12/01/2023 Self-Care: PT demo and educated patient about exercises that mimic driving to slowly progress to going back to driving.PT recommended trying to sit behind wheel of running car in [place] and move RLE gas to/from brake.  Increase speed as tolerated up to a sudden stop to prevent rear-ending someone.  Patient verbalized understanding PT educated patient about single-point cane usage, height, and about using it around the house and not community yet.  Patient verbalized understanding   PATIENT EDUCATION:  Education details: POC Person educated: Patient Education method: Programmer, Multimedia, Demonstration, and Verbal cues Education comprehension: verbalized understanding, returned demonstration, and verbal cues required  HOME EXERCISE PROGRAM: Access Code: [email or web address] URL: [email or web address] Date: 12/08/2023 Prepared by: Grayce Spatz  Exercises - Supine Bridge with Resistance Band  - 1 x daily - 7 x weekly - 2-3 sets - 10 reps - 3-5 sec hold - Clamshell with Resistance  - 1 x daily - 7 x weekly - 2-3 sets - 10 reps - 3-5 sec hold - Sidelying Reverse Clamshell  - 1 x daily - 7 x weekly - 2-3 sets - 10 reps - 3-5 seconds hold - Supine Heel Slide with Strap  - 1 x daily - 7 x weekly - 2-3 sets - 10 reps - 3-5 seconds hold  ASSESSMENT:  CLINICAL IMPRESSION: Patient appears to understand updated HEP.   She reports improved walking including notations from neighbors.  Pt  continues to benefit from skilled PT.    OBJECTIVE IMPAIRMENTS: Abnormal gait, decreased activity tolerance, decreased balance, decreased endurance, decreased knowledge of use of DME, decreased mobility, difficulty walking, decreased ROM, decreased strength, impaired flexibility, postural dysfunction, and pain.   ACTIVITY LIMITATIONS: carrying, lifting, bending, sitting, standing, squatting, stairs, transfers, bathing, toileting, and locomotion level  PARTICIPATION LIMITATIONS: meal prep, cleaning, driving,  shopping, and community activity  PERSONAL FACTORS: Age, Fitness, and 1-2 comorbidities: see PMH are also affecting patient's functional outcome.   REHAB POTENTIAL: Good  CLINICAL DECISION MAKING: Stable/uncomplicated  EVALUATION COMPLEXITY: Low   GOALS: Goals reviewed with patient? Yes  SHORT TERM GOALS: (target date for Short term goals 12/24/2023)   1.  Patient will demonstrate independent use of home exercise program to maintain progress from in clinic treatments. Goal status: Ongoing   12/08/2023  LONG TERM GOALS: (target dates for all long term goals  01/21/2024 )   1. Patient will demonstrate/report pain at worst less than or equal to 1/10 to facilitate minimal limitation in daily activity secondary to pain symptoms. Goal status: Ongoing   12/08/2023   2. Patient will demonstrate independent use of home exercise program to facilitate ability to maintain/progress functional gains from skilled physical therapy services. Goal status: Ongoing   12/08/2023   3. Patient will demonstrate Patient specific functional scale avg > or = 5 to indicate reduced disability due to condition.  Goal status: Ongoing   12/08/2023   4.  Patient will demonstrate Rt hip MMT >4/5 throughout to faciltiate usual transfers, stairs, squatting at [facility] for daily life.  Goal status: Ongoing   12/08/2023   5.  Patient will demonstrate ability to ambulate with LRAD around the community, including negotiating ramps and curbs. Goal status: Ongoing   12/08/2023   6.  Patient will demonstrate right hip AROM WFLs for ADLs and community work. Goal status:  Ongoing   12/08/2023  PLAN:  PT FREQUENCY: 1 x/week  PT DURATION:  8 weeks  PLANNED INTERVENTIONS: Can include 02853- PT Re-evaluation, 97110-Therapeutic exercises, 97530- Therapeutic activity, 97112- Neuromuscular re-education, 97535- Self Care, 97140- Manual therapy, [phone]- Gait training, [phone]- Orthotic Fit/training, [phone]- Canalith repositioning, [id number]-  Aquatic Therapy, [phone]- Electrical stimulation (unattended), [id number] Physical performance testing, 97016- Vasopneumatic device, [id number]- Ultrasound, [id number]- Traction (mechanical), [id number]- Ionotophoresis 4mg /ml Dexamethasone ,  79439 - Needle insertion w/o injection 1 or 2 muscles, 20561 - Needle insertion w/o injection 3 or more muscles.   Patient/Family education, Balance training, Stair training, Taping, Dry Needling, Joint mobilization, Joint manipulation, Spinal manipulation, Spinal mobilization, Scar mobilization, Vestibular training, Visual/preceptual remediation/compensation, DME instructions, Cryotherapy, and Moist heat.  All performed as medically necessary.  All included unless contraindicated  PLAN FOR NEXT SESSION:    check & progress HEP add stretches & balance exercises for right hip.  Ambulate w/ single-point cane instruct in negotiating ramps and curbs   Grayce Spatz, PT, DPT 12/08/2023, 12:02 PM    Date of referral: 11/25/2023 Referring provider: Vernetta Lonni GRADE*  Referring diagnosis?   S03.358 (ICD-10-CM) - Status post total replacement of right hip   Treatment diagnosis? (if different than referring diagnosis) M62.81, R26.89, M25.651, R26.81, R29.3, M25.551  What was this (referring dx) caused by? Surgery (Type: total replacement of R hip)  Nature of Condition: Initial Onset (within last 3 months)   Laterality: Rt  Current Functional Measure Score: Patient Specific Functional Scale 1.5  Objective measurements identify impairments when they are compared to normal values, the uninvolved extremity, and prior level of function.  [  x] Yes  []  No  Objective assessment of functional ability: Moderate functional limitations   Briefly describe symptoms: pain in Rt anterior hip in sitting, gait abnormalities, weakness and tightness in Rt hip, abnormal posture, difficulty with community activities.   How did symptoms start: surgery  Average pain intensity:  Last 24  hours: 2/10  Past week: 2/10  How often does the pt experience symptoms? Frequently  How much have the symptoms interfered with usual daily activities? Moderately  How has condition changed since care began at this facility? NA - initial visit  In general, how is the patients overall health? Good   BACK PAIN (STarT Back Screening Tool) No

## 2023-12-15 ENCOUNTER — Encounter: Admitting: Physical Therapy

## 2023-12-15 NOTE — Therapy (Incomplete)
 OUTPATIENT PHYSICAL THERAPY LOWER EXTREMITY TREATMENT   Patient Name: Amy [name] MRN: [id number] DOB:May 21, 1950, 73 y.o., female Today's Date: 12/15/2023  END OF SESSION:     Past Medical History:  Diagnosis Date   Arthritis    Right Hip   Colon polyp, hyperplastic 03/29/2010   Diabetes mellitus    History of kidney stones    Mild intermittent asthma    Pt denies   Past Surgical History:  Procedure Laterality Date   COLONOSCOPY     CYST EXCISION Left 15 years ago   Axillary    ECTOPIC PREGNANCY SURGERY     OVARIAN CYST REMOVAL  35 years ago   TOTAL ABDOMINAL HYSTERECTOMY  1984   TOTAL HIP ARTHROPLASTY Right 11/10/2023   Procedure: ARTHROPLASTY, HIP, TOTAL, ANTERIOR APPROACH;  Surgeon: Vernetta Lonni GRADE, MD;  Location: [facility] OR;  Service: Orthopedics;  Laterality: Right;   Patient Active Problem List   Diagnosis Date Noted   Status post total replacement of right hip 11/10/2023   Hemorrhoids 12/18/2020   Abnormal EKG 04/27/2019   Mild intermittent asthma 10/26/2017   Morbid (severe) obesity due to excess calories (HCC) 09/29/2016   Diabetes (HCC) 08/10/2013   History of kidney stones    Colon polyp, hyperplastic 03/29/2010    PCP: Purcell Emil Schanz, MD  REFERRING PROVIDER: Purcell Emil [place], [place]  REFERRING DIAG: [phone] (ICD-10-CM) - Status post total replacement of right hip   THERAPY DIAG:  No diagnosis found.  Rationale for Evaluation and Treatment: Rehabilitation  ONSET DATE: DOS: 11/10/2023  SUBJECTIVE:   SUBJECTIVE STATEMENT: ***  She was able to drive without hip pain.  She had to take a spasm pill due to back spasm.    PERTINENT HISTORY: Arthritis, DM, mild intermittent asthma,   DIAGNOSTIC FINDINGS:  DG Pelvis Portable  FINDINGS: Right hip arthroplasty in expected alignment. No periprosthetic lucency or fracture. Recent postsurgical change includes air and edema in the soft tissues. Overlying skin staples in place.    IMPRESSION: Right hip arthroplasty without immediate postoperative complication.  PAIN: *** NPRS scale:  1/10 at rest, 0/10 walking and standing, 0/10 when sleeping Pain location: anterior Rt hip, scar  Pain description: soreness, dull, achey  Aggravating factors: with movement Relieving factors: prescription pain medication, muscle relaxer icing, warm pack  PRECAUTIONS: Anterior hip, no forceful extension  WEIGHT BEARING RESTRICTIONS: No  FALLS:  Has patient fallen in last 6 months? No  LIVING ENVIRONMENT: Lives with: lives with their spouse Lives in: House/apartment Stairs: Yes: External: 1 steps; none Has following equipment at home: Single point cane, Walker - 2 wheeled, shower chair, and Grab bars  OCCUPATION: retired   PLOF: indpendent but with pain  PATIENT GOALS: walking and standing wihtout pain, gardening, community activities  Next MD visit: Post-op Visit: 12/21/2023  OBJECTIVE:   PATIENT SURVEYS:  Patient-Specific Activity Scoring Scheme  0 represents "unable to perform." 10 represents "able to perform at prior level. 0 1 2 3 4 5 6 7 8 9  10 (Date and Score)  Activity Eval     1. Walking 4     2. Standing  2     3. Driving 0   4. Stairs  0   5.    Score 1.5    Total score = sum of the activity scores/number of activities Minimum detectable change (90%CI) for average score = 2 points Minimum detectable change (90%CI) for single activity score = 3 points  COGNITION: Overall cognitive status: [facility]  SENSATION: WFL  EDEMA: not tested  MUSCLE LENGTH: not tested   POSTURE:  rounded shoulders, forward head, increased lumbar lordosis, posterior pelvic tilt, flexed trunk , and weight shift left, stands with 12* of hip flexion  PALPATION: not tested   LOWER EXTREMITY ROM:   ROM Right eval  Hip flexion RW support Standing AROM: 53*  Hip extension RW support Standing AROM: moved 4* from her standing position (-12* to -8*)  Hip abduction  RW support Standing AROM: 21*  Hip adduction RW support Standing AROM: 14*  Hip internal rotation Seated AROM: 9*  Hip external rotation Seated AROM: 12*  Knee flexion   Knee extension   Ankle dorsiflexion   Ankle plantarflexion   Ankle inversion   Ankle eversion    (Blank rows = not tested) At eval: done with walker support for balance  LOWER EXTREMITY MMT:  MMT Right eval  Hip flexion 3-/5  Hip extension 3-/5  Hip abduction 3-/5  Hip adduction 3-/5  Hip internal rotation 3-/5  Hip external rotation 3-/5  Knee flexion   Knee extension   Ankle dorsiflexion   Ankle plantarflexion   Ankle inversion   Ankle eversion    (Blank rows = not tested)  LOWER EXTREMITY SPECIAL TESTS: not tested  FUNCTIONAL TESTS:  18 inch chair transfer: requires BUE support on armrests and Wt Shift to left  5x STS: 20.31 seconds with UE assistance, Wt Shift to left Lt SLS: 3.68 seconds Rt SLS: unable to stand on Rt / only able to lift LLE off floor on 5th attempt for split second  GAIT: Distance walked: 100 x 2 Assistive device utilized: Single point cane and Walker - 2 wheeled Level of assistance: Modified independence with RW & CGA with cane Comments: SPC: short step length, flexed trunk, lack of heel contact on right foot, and weight shifted on left side Gait speed: 0.56 feet/sec with cane stand alone tip Walker: Flex trunk and weight shifted on the left side                                                                                                                                                                        TODAY'S TREATMENT                                                                          DATE: 12/15/2023 Therapeutic Exercise: *** Nustep seat 9 level 5 with BLEs & BUEs for 8 min  TREATMENT                                                                          DATE: 12/08/2023 Therapeutic Exercise: Nustep seat 9 level 5 with BLEs & BUEs for 8 min. Fluency of  RLE motion improved during 8 min.   PT educated on doing at [facility] with 10 min for 3 sets with 5 min rests.  Once this does not fatigue her, then start to reduce rest time until 30 min straight.  Pt verbalized understanding.   Supine Bridge with red Theraband Resistance Band 2 sets - 10 reps - 3 sec hold.  Clamshell with red Theraband  Resistance 2 sets - 10 reps - 3 sec hold Sidelying Reverse Clamshell  2 sets - 10 reps - 3 seconds hold Supine Heel Slide with Strap & Ball 2 sets - 10 reps - 3 seconds hold PT instructed in above exercises as HEP with HO, demo, verbal & tactile cues.  Pt verbalized understanding after completing during session.   Self-Care: PT verbally educated patient about desensitizing right hip by rubbing 5 min in shower.  Slowly over time increase amount of pressure.  Pt verbalized understanding.   Gait Training: Pt amb 100' X 2 with cane with cues on upright posture.  Pt had less antalgic gait today compared to evaluation.     TREATMENT                                                                          DATE: 12/01/2023 Self-Care: PT demo and educated patient about exercises that mimic driving to slowly progress to going back to driving.PT recommended trying to sit behind wheel of running car in [place] and move RLE gas to/from brake.  Increase speed as tolerated up to a sudden stop to prevent rear-ending someone.  Patient verbalized understanding PT educated patient about single-point cane usage, height, and about using it around the house and not community yet.  Patient verbalized understanding   PATIENT EDUCATION:  Education details: POC Person educated: Patient Education method: Programmer, multimedia, Demonstration, and Verbal cues Education comprehension: verbalized understanding, returned demonstration, and verbal cues required  HOME EXERCISE PROGRAM: Access Code: [email or web address] URL: [email or web address] Date: 12/08/2023 Prepared by: Grayce Spatz  Exercises - Supine Bridge with Resistance Band  - 1 x daily - 7 x weekly - 2-3 sets - 10 reps - 3-5 sec hold - Clamshell with Resistance  - 1 x daily - 7 x weekly - 2-3 sets - 10 reps - 3-5 sec hold - Sidelying Reverse Clamshell  - 1 x daily - 7 x weekly - 2-3 sets - 10 reps - 3-5 seconds hold - Supine Heel Slide with Strap  - 1 x daily - 7 x weekly - 2-3 sets - 10 reps - 3-5 seconds hold  ASSESSMENT:  CLINICAL IMPRESSION: ***  Patient appears to understand updated HEP.   She reports improved walking including notations from neighbors.  Pt continues to benefit from skilled PT.    OBJECTIVE IMPAIRMENTS: Abnormal gait, decreased activity tolerance, decreased balance, decreased endurance, decreased knowledge of use of DME, decreased mobility, difficulty walking, decreased ROM, decreased strength, impaired flexibility, postural dysfunction, and pain.   ACTIVITY LIMITATIONS: carrying, lifting, bending, sitting, standing, squatting, stairs, transfers, bathing, toileting, and locomotion level  PARTICIPATION LIMITATIONS: meal prep, cleaning, driving, shopping, and community activity  PERSONAL FACTORS: Age, Fitness, and 1-2 comorbidities: see PMH are also affecting patient's functional outcome.   REHAB POTENTIAL: Good  CLINICAL DECISION MAKING: Stable/uncomplicated  EVALUATION COMPLEXITY: Low   GOALS: Goals reviewed with patient? Yes  SHORT TERM GOALS: (target date for Short term goals 12/24/2023)   1.  Patient will demonstrate independent use of home exercise program to maintain progress from in clinic treatments. Goal status: Ongoing   12/08/2023  LONG TERM GOALS: (target dates for all long term goals  01/21/2024 )   1. Patient will demonstrate/report pain at worst less than or equal to 1/10 to facilitate minimal limitation in daily activity secondary to pain symptoms. Goal status: Ongoing   12/08/2023   2. Patient will demonstrate independent use of home exercise program to  facilitate ability to maintain/progress functional gains from skilled physical therapy services. Goal status: Ongoing   12/08/2023   3. Patient will demonstrate Patient specific functional scale avg > or = 5 to indicate reduced disability due to condition.  Goal status: Ongoing   12/08/2023   4.  Patient will demonstrate Rt hip MMT >4/5 throughout to faciltiate usual transfers, stairs, squatting at [facility] for daily life.  Goal status: Ongoing   12/08/2023   5.  Patient will demonstrate ability to ambulate with LRAD around the community, including negotiating ramps and curbs. Goal status: Ongoing   12/08/2023   6.  Patient will demonstrate right hip AROM WFLs for ADLs and community work. Goal status:  Ongoing   12/08/2023  PLAN:  PT FREQUENCY: 1 x/week  PT DURATION:  8 weeks  PLANNED INTERVENTIONS: Can include 02853- PT Re-evaluation, 97110-Therapeutic exercises, 97530- Therapeutic activity, 97112- Neuromuscular re-education, 97535- Self Care, 97140- Manual therapy, [phone]- Gait training, [phone]- Orthotic Fit/training, [phone]- Canalith repositioning, [id number]- Aquatic Therapy, [phone]- Electrical stimulation (unattended), [id number] Physical performance testing, 97016- Vasopneumatic device, [id number]- Ultrasound, [id number]- Traction (mechanical), [id number]- Ionotophoresis 4mg /ml Dexamethasone ,  79439 - Needle insertion w/o injection 1 or 2 muscles, 20561 - Needle insertion w/o injection 3 or more muscles.   Patient/Family education, Balance training, Stair training, Taping, Dry Needling, Joint mobilization, Joint manipulation, Spinal manipulation, Spinal mobilization, Scar mobilization, Vestibular training, Visual/preceptual remediation/compensation, DME instructions, Cryotherapy, and Moist heat.  All performed as medically necessary.  All included unless contraindicated  PLAN FOR NEXT SESSION:  **    check & progress HEP add stretches & balance exercises for right hip.  Ambulate w/ single-point cane instruct in  negotiating ramps and curbs   Ismael Nap, Student-PT, DPT 12/15/2023, 7:56 AM    Date of referral: 11/25/2023 Referring provider: Purcell Emil Schanz, *  Referring diagnosis?   S03.358 (ICD-10-CM) - Status post total replacement of right hip   Treatment diagnosis? (if different than referring diagnosis) M62.81, R26.89, M25.651, R26.81, R29.3, M25.551  What was this (referring dx) caused by? Surgery (Type: total replacement of R hip)  Nature of Condition: Initial Onset (within last 3 months)   Laterality: Rt  Current Functional Measure Score: Patient Specific Functional Scale 1.5  Objective measurements identify impairments when they are compared to normal values, the uninvolved extremity, and  prior level of function.  [x]  Yes  []  No  Objective assessment of functional ability: Moderate functional limitations   Briefly describe symptoms: pain in Rt anterior hip in sitting, gait abnormalities, weakness and tightness in Rt hip, abnormal posture, difficulty with community activities.   How did symptoms start: surgery  Average pain intensity:  Last 24 hours: 2/10  Past week: 2/10  How often does the pt experience symptoms? Frequently  How much have the symptoms interfered with usual daily activities? Moderately  How has condition changed since care began at this facility? NA - initial visit  In general, how is the patients overall health? Good   BACK PAIN (STarT Back Screening Tool) No

## 2023-12-21 ENCOUNTER — Ambulatory Visit: Admitting: Orthopaedic Surgery

## 2023-12-21 ENCOUNTER — Encounter: Admitting: Physical Therapy

## 2023-12-21 ENCOUNTER — Encounter: Payer: Self-pay | Admitting: Orthopaedic Surgery

## 2023-12-21 DIAGNOSIS — Z96641 Presence of right artificial hip joint: Secondary | ICD-10-CM

## 2023-12-21 NOTE — Progress Notes (Signed)
 The patient is now 6 weeks status post a right total hip replacement to treat significant right hip pain and arthritis.  She is a very active 73 year old female.  She says she is doing well.  She said she did just feel some popping in her knee when she was going up steps but otherwise is doing well.  Her right operative hip moves smoothly and fluidly.  She cannot cross her leg yet over her left side like she can only left to right but knows that this will take some time.  Overall though she does look good.  She is walking with a more normal gait.  From our standpoint the next time when you see her is not for 6 months unless she is having issues.  Will have a standing AP pelvis and lateral of her right operative hip at that visit.  If there are issues before then she knows to reach out and let us  know.

## 2023-12-21 NOTE — Therapy (Incomplete)
 OUTPATIENT PHYSICAL THERAPY LOWER EXTREMITY TREATMENT   Patient Name: Amy [name] MRN: [id number] DOB:[birth year], 73 y.o., female Today's Date: 12/21/2023  END OF SESSION:     Past Medical History:  Diagnosis Date   Arthritis    Right Hip   Colon polyp, hyperplastic 03/29/2010   Diabetes mellitus    History of kidney stones    Mild intermittent asthma    Pt denies   Past Surgical History:  Procedure Laterality Date   COLONOSCOPY     CYST EXCISION Left 15 years ago   Axillary    ECTOPIC PREGNANCY SURGERY     OVARIAN CYST REMOVAL  35 years ago   TOTAL ABDOMINAL HYSTERECTOMY  1984   TOTAL HIP ARTHROPLASTY Right 11/10/2023   Procedure: ARTHROPLASTY, HIP, TOTAL, ANTERIOR APPROACH;  Surgeon: Vernetta Lonni GRADE, MD;  Location: [facility] OR;  Service: Orthopedics;  Laterality: Right;   Patient Active Problem List   Diagnosis Date Noted   Status post total replacement of right hip 11/10/2023   Hemorrhoids 12/18/2020   Abnormal EKG 04/27/2019   Mild intermittent asthma 10/26/2017   Morbid (severe) obesity due to excess calories (HCC) 09/29/2016   Diabetes (HCC) 08/10/2013   History of kidney stones    Colon polyp, hyperplastic 03/29/2010    PCP: Purcell Emil Schanz, MD  REFERRING PROVIDER: Vernetta Lonni GRADE*  REFERRING DIAG: S03.358 (ICD-10-CM) - Status post total replacement of right hip   THERAPY DIAG:  No diagnosis found.  Rationale for Evaluation and Treatment: Rehabilitation  ONSET DATE: DOS: 11/10/2023  SUBJECTIVE:   SUBJECTIVE STATEMENT: ***  She was able to drive without hip pain.  She had to take a spasm pill due to back spasm.    PERTINENT HISTORY: Arthritis, DM, mild intermittent asthma,   DIAGNOSTIC FINDINGS:  DG Pelvis Portable  FINDINGS: Right hip arthroplasty in expected alignment. No periprosthetic lucency or fracture. Recent postsurgical change includes air and edema in the soft tissues. Overlying skin staples in place.    IMPRESSION: Right hip arthroplasty without immediate postoperative complication.  PAIN: *** NPRS scale:  1/10 at rest, 0/10 walking and standing, 0/10 when sleeping Pain location: anterior Rt hip, scar  Pain description: soreness, dull, achey  Aggravating factors: with movement Relieving factors: prescription pain medication, muscle relaxer icing, warm pack  PRECAUTIONS: Anterior hip, no forceful extension  WEIGHT BEARING RESTRICTIONS: No  FALLS:  Has patient fallen in last 6 months? No  LIVING ENVIRONMENT: Lives with: lives with their spouse Lives in: House/apartment Stairs: Yes: External: 1 steps; none Has following equipment at home: Single point cane, Walker - 2 wheeled, shower chair, and Grab bars  OCCUPATION: retired   PLOF: indpendent but with pain  PATIENT GOALS: walking and standing wihtout pain, gardening, community activities  Next MD visit: Post-op Visit: 12/21/2023  OBJECTIVE:   PATIENT SURVEYS:  Patient-Specific Activity Scoring Scheme  0 represents "unable to perform." 10 represents "able to perform at prior level. 0 1 2 3 4 5 6 7 8 9  10 (Date and Score)  Activity Eval     1. Walking 4     2. Standing  2     3. Driving 0   4. Stairs  0   5.    Score 1.5    Total score = sum of the activity scores/number of activities Minimum detectable change (90%CI) for average score = 2 points Minimum detectable change (90%CI) for single activity score = 3 points  COGNITION: Overall cognitive status: [facility]  SENSATION: WFL  EDEMA: not tested  MUSCLE LENGTH: not tested   POSTURE:  rounded shoulders, forward head, increased lumbar lordosis, posterior pelvic tilt, flexed trunk , and weight shift left, stands with 12* of hip flexion  PALPATION: not tested   LOWER EXTREMITY ROM:   ROM Right eval  Hip flexion RW support Standing AROM: 53*  Hip extension RW support Standing AROM: moved 4* from her standing position (-12* to -8*)  Hip abduction  RW support Standing AROM: 21*  Hip adduction RW support Standing AROM: 14*  Hip internal rotation Seated AROM: 9*  Hip external rotation Seated AROM: 12*  Knee flexion   Knee extension   Ankle dorsiflexion   Ankle plantarflexion   Ankle inversion   Ankle eversion    (Blank rows = not tested) At eval: done with walker support for balance  LOWER EXTREMITY MMT:  MMT Right eval  Hip flexion 3-/5  Hip extension 3-/5  Hip abduction 3-/5  Hip adduction 3-/5  Hip internal rotation 3-/5  Hip external rotation 3-/5  Knee flexion   Knee extension   Ankle dorsiflexion   Ankle plantarflexion   Ankle inversion   Ankle eversion    (Blank rows = not tested)  LOWER EXTREMITY SPECIAL TESTS: not tested  FUNCTIONAL TESTS:  18 inch chair transfer: requires BUE support on armrests and Wt Shift to left  5x STS: 20.31 seconds with UE assistance, Wt Shift to left Lt SLS: 3.68 seconds Rt SLS: unable to stand on Rt / only able to lift LLE off floor on 5th attempt for split second  GAIT: Distance walked: 100 x 2 Assistive device utilized: Single point cane and Walker - 2 wheeled Level of assistance: Modified independence with RW & CGA with cane Comments: SPC: short step length, flexed trunk, lack of heel contact on right foot, and weight shifted on left side Gait speed: 0.56 feet/sec with cane stand alone tip Walker: Flex trunk and weight shifted on the left side                                                                                                                                                                        TODAY'S TREATMENT                                                                          DATE: 12/21/2023 Therapeutic Exercise: *** Nustep seat 9 level 5 with BLEs & BUEs for 8 min  TREATMENT                                                                          DATE: 12/08/2023 Therapeutic Exercise: Nustep seat 9 level 5 with BLEs & BUEs for 8 min. Fluency of  RLE motion improved during 8 min.   PT educated on doing at [facility] with 10 min for 3 sets with 5 min rests.  Once this does not fatigue her, then start to reduce rest time until 30 min straight.  Pt verbalized understanding.   Supine Bridge with red Theraband Resistance Band 2 sets - 10 reps - 3 sec hold.  Clamshell with red Theraband  Resistance 2 sets - 10 reps - 3 sec hold Sidelying Reverse Clamshell  2 sets - 10 reps - 3 seconds hold Supine Heel Slide with Strap & Ball 2 sets - 10 reps - 3 seconds hold PT instructed in above exercises as HEP with HO, demo, verbal & tactile cues.  Pt verbalized understanding after completing during session.   Self-Care: PT verbally educated patient about desensitizing right hip by rubbing 5 min in shower.  Slowly over time increase amount of pressure.  Pt verbalized understanding.   Gait Training: Pt amb 100' X 2 with cane with cues on upright posture.  Pt had less antalgic gait today compared to evaluation.     TREATMENT                                                                          DATE: 12/01/2023 Self-Care: PT demo and educated patient about exercises that mimic driving to slowly progress to going back to driving.PT recommended trying to sit behind wheel of running car in [place] and move RLE gas to/from brake.  Increase speed as tolerated up to a sudden stop to prevent rear-ending someone.  Patient verbalized understanding PT educated patient about single-point cane usage, height, and about using it around the house and not community yet.  Patient verbalized understanding   PATIENT EDUCATION:  Education details: POC Person educated: Patient Education method: Programmer, multimedia, Demonstration, and Verbal cues Education comprehension: verbalized understanding, returned demonstration, and verbal cues required  HOME EXERCISE PROGRAM: Access Code: [email or web address] URL: [email or web address] Date: 12/08/2023 Prepared by: Grayce Spatz  Exercises - Supine Bridge with Resistance Band  - 1 x daily - 7 x weekly - 2-3 sets - 10 reps - 3-5 sec hold - Clamshell with Resistance  - 1 x daily - 7 x weekly - 2-3 sets - 10 reps - 3-5 sec hold - Sidelying Reverse Clamshell  - 1 x daily - 7 x weekly - 2-3 sets - 10 reps - 3-5 seconds hold - Supine Heel Slide with Strap  - 1 x daily - 7 x weekly - 2-3 sets - 10 reps - 3-5 seconds hold  ASSESSMENT:  CLINICAL IMPRESSION: ***  Patient appears to understand updated HEP.   She reports improved walking including notations from neighbors.  Pt continues to benefit from skilled PT.    OBJECTIVE IMPAIRMENTS: Abnormal gait, decreased activity tolerance, decreased balance, decreased endurance, decreased knowledge of use of DME, decreased mobility, difficulty walking, decreased ROM, decreased strength, impaired flexibility, postural dysfunction, and pain.   ACTIVITY LIMITATIONS: carrying, lifting, bending, sitting, standing, squatting, stairs, transfers, bathing, toileting, and locomotion level  PARTICIPATION LIMITATIONS: meal prep, cleaning, driving, shopping, and community activity  PERSONAL FACTORS: Age, Fitness, and 1-2 comorbidities: see PMH are also affecting patient's functional outcome.   REHAB POTENTIAL: Good  CLINICAL DECISION MAKING: Stable/uncomplicated  EVALUATION COMPLEXITY: Low   GOALS: Goals reviewed with patient? Yes  SHORT TERM GOALS: (target date for Short term goals 12/24/2023)   1.  Patient will demonstrate independent use of home exercise program to maintain progress from in clinic treatments. Goal status: Ongoing    12/21/2023  LONG TERM GOALS: (target dates for all long term goals  01/21/2024 )   1. Patient will demonstrate/report pain at worst less than or equal to 1/10 to facilitate minimal limitation in daily activity secondary to pain symptoms. Goal status: Ongoing    12/21/2023   2. Patient will demonstrate independent use of home exercise program to  facilitate ability to maintain/progress functional gains from skilled physical therapy services. Goal status: Ongoing    12/21/2023   3. Patient will demonstrate Patient specific functional scale avg > or = 5 to indicate reduced disability due to condition.  Goal status: Ongoing    12/21/2023   4.  Patient will demonstrate Rt hip MMT >4/5 throughout to faciltiate usual transfers, stairs, squatting at [facility] for daily life.  Goal status: Ongoing    12/21/2023   5.  Patient will demonstrate ability to ambulate with LRAD around the community, including negotiating ramps and curbs. Goal status: Ongoing    12/21/2023   6.  Patient will demonstrate right hip AROM WFLs for ADLs and community work. Goal status:  Ongoing   12/21/2023  PLAN:  PT FREQUENCY: 1 x/week  PT DURATION:  8 weeks  PLANNED INTERVENTIONS: Can include 02853- PT Re-evaluation, 97110-Therapeutic exercises, 97530- Therapeutic activity, 97112- Neuromuscular re-education, 97535- Self Care, 97140- Manual therapy, [phone]- Gait training, [phone]- Orthotic Fit/training, [phone]- Canalith repositioning, [id number]- Aquatic Therapy, [phone]- Electrical stimulation (unattended), [id number] Physical performance testing, 97016- Vasopneumatic device, [id number]- Ultrasound, [id number]- Traction (mechanical), [id number]- Ionotophoresis 4mg /ml Dexamethasone ,  79439 - Needle insertion w/o injection 1 or 2 muscles, 20561 - Needle insertion w/o injection 3 or more muscles.   Patient/Family education, Balance training, Stair training, Taping, Dry Needling, Joint mobilization, Joint manipulation, Spinal manipulation, Spinal mobilization, Scar mobilization, Vestibular training, Visual/preceptual remediation/compensation, DME instructions, Cryotherapy, and Moist heat.  All performed as medically necessary.  All included unless contraindicated  PLAN FOR NEXT SESSION:  ***  check & progress HEP add stretches & balance exercises for right hip.  Ambulate w/ single-point cane instruct in  negotiating ramps and curbs   Grayce Spatz, PT, DPT 12/21/2023, 7:22 AM    Date of referral: 11/25/2023 Referring provider: Vernetta Lonni GRADE*  Referring diagnosis?   S03.358 (ICD-10-CM) - Status post total replacement of right hip   Treatment diagnosis? (if different than referring diagnosis) M62.81, R26.89, M25.651, R26.81, R29.3, M25.551  What was this (referring dx) caused by? Surgery (Type: total replacement of R hip)  Nature of Condition: Initial Onset (within last 3 months)   Laterality: Rt  Current Functional Measure Score: Patient Specific Functional Scale 1.5  Objective measurements identify impairments when they are compared to normal values, the  uninvolved extremity, and prior level of function.  [x]  Yes  []  No  Objective assessment of functional ability: Moderate functional limitations   Briefly describe symptoms: pain in Rt anterior hip in sitting, gait abnormalities, weakness and tightness in Rt hip, abnormal posture, difficulty with community activities.   How did symptoms start: surgery  Average pain intensity:  Last 24 hours: 2/10  Past week: 2/10  How often does the pt experience symptoms? Frequently  How much have the symptoms interfered with usual daily activities? Moderately  How has condition changed since care began at this facility? NA - initial visit  In general, how is the patients overall health? Good   BACK PAIN (STarT Back Screening Tool) No

## 2023-12-21 NOTE — Therapy (Incomplete)
 OUTPATIENT PHYSICAL THERAPY LOWER EXTREMITY TREATMENT   Patient Name: Amy [name] MRN: [id number] DOB:November 19, 1950, 73 y.o., female Today's Date: 12/21/2023  END OF SESSION:     Past Medical History:  Diagnosis Date   Arthritis    Right Hip   Colon polyp, hyperplastic 03/29/2010   Diabetes mellitus    History of kidney stones    Mild intermittent asthma    Pt denies   Past Surgical History:  Procedure Laterality Date   COLONOSCOPY     CYST EXCISION Left 15 years ago   Axillary    ECTOPIC PREGNANCY SURGERY     OVARIAN CYST REMOVAL  35 years ago   TOTAL ABDOMINAL HYSTERECTOMY  1984   TOTAL HIP ARTHROPLASTY Right 11/10/2023   Procedure: ARTHROPLASTY, HIP, TOTAL, ANTERIOR APPROACH;  Surgeon: Vernetta Lonni GRADE, MD;  Location: [facility] OR;  Service: Orthopedics;  Laterality: Right;   Patient Active Problem List   Diagnosis Date Noted   Status post total replacement of right hip 11/10/2023   Hemorrhoids 12/18/2020   Abnormal EKG 04/27/2019   Mild intermittent asthma 10/26/2017   Morbid (severe) obesity due to excess calories (HCC) 09/29/2016   Diabetes (HCC) 08/10/2013   History of kidney stones    Colon polyp, hyperplastic 03/29/2010    PCP: Purcell Emil Schanz, MD  REFERRING PROVIDER: Vernetta Lonni GRADE*  REFERRING DIAG: S03.358 (ICD-10-CM) - Status post total replacement of right hip   THERAPY DIAG:  No diagnosis found.  Rationale for Evaluation and Treatment: Rehabilitation  ONSET DATE: DOS: 11/10/2023  SUBJECTIVE:   SUBJECTIVE STATEMENT:  ***  She was able to drive without hip pain.  She had to take a spasm pill due to back spasm.    PERTINENT HISTORY: Arthritis, DM, mild intermittent asthma,   DIAGNOSTIC FINDINGS:  DG Pelvis Portable  FINDINGS: Right hip arthroplasty in expected alignment. No periprosthetic lucency or fracture. Recent postsurgical change includes air and edema in the soft tissues. Overlying skin staples in place.    IMPRESSION: Right hip arthroplasty without immediate postoperative complication.  PAIN:  NPRS scale:  1/10 at rest, 0/10 walking and standing, 0/10 when sleeping Pain location: anterior Rt hip, scar  Pain description: soreness, dull, achey  Aggravating factors: with movement Relieving factors: prescription pain medication, muscle relaxer icing, warm pack  PRECAUTIONS: Anterior hip, no forceful extension  WEIGHT BEARING RESTRICTIONS: No  FALLS:  Has patient fallen in last 6 months? No  LIVING ENVIRONMENT: Lives with: lives with their spouse Lives in: House/apartment Stairs: Yes: External: 1 steps; none Has following equipment at home: Single point cane, Walker - 2 wheeled, shower chair, and Grab bars  OCCUPATION: retired   PLOF: indpendent but with pain  PATIENT GOALS: walking and standing wihtout pain, gardening, community activities  Next MD visit: Post-op Visit: 12/21/2023  OBJECTIVE:   PATIENT SURVEYS:  Patient-Specific Activity Scoring Scheme  0 represents "unable to perform." 10 represents "able to perform at prior level. 0 1 2 3 4 5 6 7 8 9  10 (Date and Score)  Activity Eval     1. Walking 4     2. Standing  2     3. Driving 0   4. Stairs  0   5.    Score 1.5    Total score = sum of the activity scores/number of activities Minimum detectable change (90%CI) for average score = 2 points Minimum detectable change (90%CI) for single activity score = 3 points  COGNITION: Overall cognitive status: [facility]  SENSATION: WFL  EDEMA: not tested  MUSCLE LENGTH: not tested   POSTURE:  rounded shoulders, forward head, increased lumbar lordosis, posterior pelvic tilt, flexed trunk , and weight shift left, stands with 12* of hip flexion  PALPATION: not tested   LOWER EXTREMITY ROM:   ROM Right eval  Hip flexion RW support Standing AROM: 53*  Hip extension RW support Standing AROM: moved 4* from her standing position (-12* to -8*)  Hip abduction RW  support Standing AROM: 21*  Hip adduction RW support Standing AROM: 14*  Hip internal rotation Seated AROM: 9*  Hip external rotation Seated AROM: 12*  Knee flexion   Knee extension   Ankle dorsiflexion   Ankle plantarflexion   Ankle inversion   Ankle eversion    (Blank rows = not tested) At eval: done with walker support for balance  LOWER EXTREMITY MMT:  MMT Right eval  Hip flexion 3-/5  Hip extension 3-/5  Hip abduction 3-/5  Hip adduction 3-/5  Hip internal rotation 3-/5  Hip external rotation 3-/5  Knee flexion   Knee extension   Ankle dorsiflexion   Ankle plantarflexion   Ankle inversion   Ankle eversion    (Blank rows = not tested)  LOWER EXTREMITY SPECIAL TESTS: not tested  FUNCTIONAL TESTS:  18 inch chair transfer: requires BUE support on armrests and Wt Shift to left  5x STS: 20.31 seconds with UE assistance, Wt Shift to left Lt SLS: 3.68 seconds Rt SLS: unable to stand on Rt / only able to lift LLE off floor on 5th attempt for split second  GAIT: Distance walked: 100 x 2 Assistive device utilized: Single point cane and Walker - 2 wheeled Level of assistance: Modified independence with RW & CGA with cane Comments: SPC: short step length, flexed trunk, lack of heel contact on right foot, and weight shifted on left side Gait speed: 0.56 feet/sec with cane stand alone tip Walker: Flex trunk and weight shifted on the left side                                                                                                                                                                        TODAY'S TREATMENT                                                                          DATE: 12/22/2023 Therapeutic Exercise: *** Nustep seat 9 level 5 with BLEs & BUEs for 8 min  TREATMENT                                                                          DATE: 12/08/2023 Therapeutic Exercise: Nustep seat 9 level 5 with BLEs & BUEs for 8 min. Fluency of RLE  motion improved during 8 min.   PT educated on doing at [facility] with 10 min for 3 sets with 5 min rests.  Once this does not fatigue her, then start to reduce rest time until 30 min straight.  Pt verbalized understanding.   Supine Bridge with red Theraband Resistance Band 2 sets - 10 reps - 3 sec hold.  Clamshell with red Theraband  Resistance 2 sets - 10 reps - 3 sec hold Sidelying Reverse Clamshell  2 sets - 10 reps - 3 seconds hold Supine Heel Slide with Strap & Ball 2 sets - 10 reps - 3 seconds hold PT instructed in above exercises as HEP with HO, demo, verbal & tactile cues.  Pt verbalized understanding after completing during session.   Self-Care: PT verbally educated patient about desensitizing right hip by rubbing 5 min in shower.  Slowly over time increase amount of pressure.  Pt verbalized understanding.   Gait Training: Pt amb 100' X 2 with cane with cues on upright posture.  Pt had less antalgic gait today compared to evaluation.     TREATMENT                                                                          DATE: 12/01/2023 Self-Care: PT demo and educated patient about exercises that mimic driving to slowly progress to going back to driving.PT recommended trying to sit behind wheel of running car in [place] and move RLE gas to/from brake.  Increase speed as tolerated up to a sudden stop to prevent rear-ending someone.  Patient verbalized understanding PT educated patient about single-point cane usage, height, and about using it around the house and not community yet.  Patient verbalized understanding   PATIENT EDUCATION:  Education details: POC Person educated: Patient Education method: Programmer, multimedia, Demonstration, and Verbal cues Education comprehension: verbalized understanding, returned demonstration, and verbal cues required  HOME EXERCISE PROGRAM: Access Code: [email or web address] URL: [email or web address] Date: 12/08/2023 Prepared by: Grayce Spatz  Exercises - Supine Bridge with Resistance Band  - 1 x daily - 7 x weekly - 2-3 sets - 10 reps - 3-5 sec hold - Clamshell with Resistance  - 1 x daily - 7 x weekly - 2-3 sets - 10 reps - 3-5 sec hold - Sidelying Reverse Clamshell  - 1 x daily - 7 x weekly - 2-3 sets - 10 reps - 3-5 seconds hold - Supine Heel Slide with Strap  - 1 x daily - 7 x weekly - 2-3 sets - 10 reps - 3-5 seconds hold  ASSESSMENT:  CLINICAL IMPRESSION:  ***     Patient appears to understand updated HEP.   She reports improved walking  including notations from neighbors.  Pt continues to benefit from skilled PT.    OBJECTIVE IMPAIRMENTS: Abnormal gait, decreased activity tolerance, decreased balance, decreased endurance, decreased knowledge of use of DME, decreased mobility, difficulty walking, decreased ROM, decreased strength, impaired flexibility, postural dysfunction, and pain.   ACTIVITY LIMITATIONS: carrying, lifting, bending, sitting, standing, squatting, stairs, transfers, bathing, toileting, and locomotion level  PARTICIPATION LIMITATIONS: meal prep, cleaning, driving, shopping, and community activity  PERSONAL FACTORS: Age, Fitness, and 1-2 comorbidities: see PMH are also affecting patient's functional outcome.   REHAB POTENTIAL: Good  CLINICAL DECISION MAKING: Stable/uncomplicated  EVALUATION COMPLEXITY: Low   GOALS: Goals reviewed with patient? Yes  SHORT TERM GOALS: (target date for Short term goals 12/24/2023)   1.  Patient will demonstrate independent use of home exercise program to maintain progress from in clinic treatments. Goal status: Ongoing    12/21/2023  LONG TERM GOALS: (target dates for all long term goals  01/21/2024 )   1. Patient will demonstrate/report pain at worst less than or equal to 1/10 to facilitate minimal limitation in daily activity secondary to pain symptoms. Goal status: Ongoing    12/21/2023   2. Patient will demonstrate independent use of home exercise  program to facilitate ability to maintain/progress functional gains from skilled physical therapy services. Goal status: Ongoing    12/21/2023   3. Patient will demonstrate Patient specific functional scale avg > or = 5 to indicate reduced disability due to condition.  Goal status: Ongoing    12/21/2023   4.  Patient will demonstrate Rt hip MMT >4/5 throughout to faciltiate usual transfers, stairs, squatting at [facility] for daily life.  Goal status: Ongoing    12/21/2023   5.  Patient will demonstrate ability to ambulate with LRAD around the community, including negotiating ramps and curbs. Goal status: Ongoing    12/21/2023   6.  Patient will demonstrate right hip AROM WFLs for ADLs and community work. Goal status:  Ongoing   12/21/2023  PLAN:  PT FREQUENCY: 1 x/week  PT DURATION:  8 weeks  PLANNED INTERVENTIONS: Can include 02853- PT Re-evaluation, 97110-Therapeutic exercises, 97530- Therapeutic activity, 97112- Neuromuscular re-education, 97535- Self Care, 97140- Manual therapy, [phone]- Gait training, [phone]- Orthotic Fit/training, [phone]- Canalith repositioning, [id number]- Aquatic Therapy, [phone]- Electrical stimulation (unattended), [id number] Physical performance testing, 97016- Vasopneumatic device, [id number]- Ultrasound, [id number]- Traction (mechanical), [id number]- Ionotophoresis 4mg /ml Dexamethasone ,  79439 - Needle insertion w/o injection 1 or 2 muscles, 20561 - Needle insertion w/o injection 3 or more muscles.   Patient/Family education, Balance training, Stair training, Taping, Dry Needling, Joint mobilization, Joint manipulation, Spinal manipulation, Spinal mobilization, Scar mobilization, Vestibular training, Visual/preceptual remediation/compensation, DME instructions, Cryotherapy, and Moist heat.  All performed as medically necessary.  All included unless contraindicated  PLAN FOR NEXT SESSION:  ***  check & progress HEP add stretches & balance exercises for right hip.  Ambulate w/ single-point cane instruct  in negotiating ramps and curbs   Susannah Daring, PT, DPT 12/21/23 3:57 PM     Date of referral: 11/25/2023 Referring provider: Vernetta Lonni GRADE*  Referring diagnosis?   S03.358 (ICD-10-CM) - Status post total replacement of right hip   Treatment diagnosis? (if different than referring diagnosis) M62.81, R26.89, M25.651, R26.81, R29.3, M25.551  What was this (referring dx) caused by? Surgery (Type: total replacement of R hip)  Nature of Condition: Initial Onset (within last 3 months)   Laterality: Rt  Current Functional Measure Score: Patient Specific Functional Scale 1.5  Objective measurements identify impairments when they  are compared to normal values, the uninvolved extremity, and prior level of function.  [x]  Yes  []  No  Objective assessment of functional ability: Moderate functional limitations   Briefly describe symptoms: pain in Rt anterior hip in sitting, gait abnormalities, weakness and tightness in Rt hip, abnormal posture, difficulty with community activities.   How did symptoms start: surgery  Average pain intensity:  Last 24 hours: 2/10  Past week: 2/10  How often does the pt experience symptoms? Frequently  How much have the symptoms interfered with usual daily activities? Moderately  How has condition changed since care began at this facility? NA - initial visit  In general, how is the patients overall health? Good   BACK PAIN (STarT Back Screening Tool) No

## 2023-12-22 ENCOUNTER — Encounter

## 2023-12-29 ENCOUNTER — Encounter: Admitting: Physical Therapy

## 2024-01-14 DIAGNOSIS — L600 Ingrowing nail: Secondary | ICD-10-CM | POA: Diagnosis not present

## 2024-01-14 DIAGNOSIS — E119 Type 2 diabetes mellitus without complications: Secondary | ICD-10-CM | POA: Diagnosis not present

## 2024-01-20 ENCOUNTER — Other Ambulatory Visit (INDEPENDENT_AMBULATORY_CARE_PROVIDER_SITE_OTHER): Payer: Self-pay

## 2024-01-20 ENCOUNTER — Ambulatory Visit (INDEPENDENT_AMBULATORY_CARE_PROVIDER_SITE_OTHER): Admitting: Orthopaedic Surgery

## 2024-01-20 ENCOUNTER — Encounter: Payer: Self-pay | Admitting: Orthopaedic Surgery

## 2024-01-20 DIAGNOSIS — Z96641 Presence of right artificial hip joint: Secondary | ICD-10-CM | POA: Diagnosis not present

## 2024-01-20 MED ORDER — METHOCARBAMOL 500 MG PO TABS: 500.0000 mg | ORAL_TABLET | Freq: Three times a day (TID) | ORAL | 1 refills | Status: AC | PRN

## 2024-01-20 NOTE — Progress Notes (Signed)
 The patient is a 73 year old female well-known to us .  She is now 10 weeks status post a right total hip arthroplasty to treat significant right hip pain and arthritis.  She says she is doing really well and has good range of motion and strength.  She still has some occasional spasms and we talked about having some methocarbamol  on hand for when she does have some spasms.  She is walking without assistive device and her ADLs have improved significantly.  Her right operative hip move smoothly and fluidly no blocks or rotation at all.  An AP pelvis and lateral the right hip shows a well-seated right total hip arthroplasty with no complicating features.  She will continue to increase her activities as comfort allows with no restrictions.  Will see her back in 6 months for final visit with an AP pelvis of the right hip.  If there are issues before then she knows to let us  know.

## 2024-01-22 ENCOUNTER — Telehealth: Payer: Self-pay | Admitting: Orthopaedic Surgery

## 2024-01-22 NOTE — Telephone Encounter (Signed)
 Miracle from [facility] called and said that he fax over a standard written order for a walker but it is missing his signature so they ask if he could fax it back at [phone] CB#[phone]

## 2024-01-25 NOTE — Telephone Encounter (Signed)
 Faxed to provided number

## 2024-03-01 ENCOUNTER — Telehealth: Payer: Self-pay | Admitting: Orthopaedic Surgery

## 2024-03-01 NOTE — Telephone Encounter (Signed)
 Patient called and said she needs to you about her surgery she had. She didn't want to tell me anything. CB#[phone]

## 2024-03-02 ENCOUNTER — Encounter: Payer: Self-pay | Admitting: Orthopaedic Surgery

## 2024-03-02 ENCOUNTER — Other Ambulatory Visit (INDEPENDENT_AMBULATORY_CARE_PROVIDER_SITE_OTHER)

## 2024-03-02 ENCOUNTER — Ambulatory Visit: Admitting: Orthopaedic Surgery

## 2024-03-02 DIAGNOSIS — M5431 Sciatica, right side: Secondary | ICD-10-CM

## 2024-03-02 DIAGNOSIS — M5432 Sciatica, left side: Secondary | ICD-10-CM

## 2024-03-02 DIAGNOSIS — R29898 Other symptoms and signs involving the musculoskeletal system: Secondary | ICD-10-CM | POA: Diagnosis not present

## 2024-03-02 NOTE — Progress Notes (Signed)
 Patient is well-known to us .  She comes in with bilateral leg pain and weakness and sciatica.  She feels like this is really due to a flareup of her hemorrhoids.  She says this always occurs after her hemorrhoids flareup and she would like to see a specialist for hemorrhoids and wants a recommendation for that.  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  She said she did have some injections in her back and also states she was not told of the significance of her stenosis.  We replaced her right hip and just July of this year secondary to severe arthritis and that is done very well for her.  Exam her right hip moves smoothly and fluidly getting close to 4 months out from her surgery.  She does have a positive straight leg raise bilaterally and has sciatic pain that radiates into the hamstring on both sides.  An AP and lateral of the lumbar spine today shows loss of lumbar lordosis and significant degenerative changes throughout the lumbar spine and I did go over the MRI report of her lumbar spine from a year ago showing the severity of her foraminal stenosis.  I do feel that she would benefit from seeing a spine specialist but she would rather see a hemorrhoid specialist first for at least a colorectal surgeon who can better evaluate this.  We can certainly refer her to one of our colleagues at [facility] for evaluation for hemorrhoids.

## 2024-03-03 ENCOUNTER — Other Ambulatory Visit: Payer: Self-pay

## 2024-03-03 DIAGNOSIS — R29898 Other symptoms and signs involving the musculoskeletal system: Secondary | ICD-10-CM

## 2024-03-03 DIAGNOSIS — K649 Unspecified hemorrhoids: Secondary | ICD-10-CM

## 2024-03-07 ENCOUNTER — Other Ambulatory Visit: Payer: Self-pay | Admitting: Emergency Medicine

## 2024-03-07 DIAGNOSIS — Z1231 Encounter for screening mammogram for malignant neoplasm of breast: Secondary | ICD-10-CM

## 2024-03-09 ENCOUNTER — Ambulatory Visit
Admission: RE | Admit: 2024-03-09 | Discharge: 2024-03-09 | Disposition: A | Discharge status: Home / Self Care | Source: Ambulatory Visit | Attending: Emergency Medicine | Admitting: Emergency Medicine

## 2024-03-09 DIAGNOSIS — Z1231 Encounter for screening mammogram for malignant neoplasm of breast: Secondary | ICD-10-CM

## 2024-03-14 ENCOUNTER — Encounter: Payer: Self-pay | Admitting: Radiology

## 2024-03-14 ENCOUNTER — Other Ambulatory Visit: Payer: Self-pay | Admitting: Emergency Medicine

## 2024-03-14 DIAGNOSIS — R928 Other abnormal and inconclusive findings on diagnostic imaging of breast: Secondary | ICD-10-CM

## 2024-03-24 ENCOUNTER — Ambulatory Visit
Admission: RE | Admit: 2024-03-24 | Discharge: 2024-03-24 | Disposition: A | Source: Ambulatory Visit | Attending: Emergency Medicine

## 2024-03-24 DIAGNOSIS — R928 Other abnormal and inconclusive findings on diagnostic imaging of breast: Secondary | ICD-10-CM

## 2024-03-25 ENCOUNTER — Other Ambulatory Visit: Payer: Self-pay | Admitting: Emergency Medicine

## 2024-03-25 DIAGNOSIS — R921 Mammographic calcification found on diagnostic imaging of breast: Secondary | ICD-10-CM

## 2024-04-06 ENCOUNTER — Encounter

## 2024-04-11 ENCOUNTER — Ambulatory Visit: Admitting: Emergency Medicine

## 2024-04-13 ENCOUNTER — Ambulatory Visit
Admission: RE | Admit: 2024-04-13 | Discharge: 2024-04-13 | Disposition: A | Source: Ambulatory Visit | Attending: Emergency Medicine | Admitting: Emergency Medicine

## 2024-04-13 ENCOUNTER — Ambulatory Visit
Admission: RE | Admit: 2024-04-13 | Discharge: 2024-04-13 | Disposition: A | Source: Ambulatory Visit | Attending: Emergency Medicine

## 2024-04-13 DIAGNOSIS — R921 Mammographic calcification found on diagnostic imaging of breast: Secondary | ICD-10-CM

## 2024-04-13 HISTORY — PX: BREAST BIOPSY: SHX20

## 2024-04-14 LAB — SURGICAL PATHOLOGY

## 2024-04-24 ENCOUNTER — Other Ambulatory Visit: Payer: Self-pay | Admitting: Emergency Medicine

## 2024-04-26 ENCOUNTER — Telehealth: Payer: Self-pay | Admitting: Emergency Medicine

## 2024-04-26 ENCOUNTER — Other Ambulatory Visit: Payer: Self-pay | Admitting: Emergency Medicine

## 2024-04-26 MED ORDER — ALBUTEROL SULFATE HFA 108 (90 BASE) MCG/ACT IN AERS: 1.0000 | INHALATION_SPRAY | Freq: Four times a day (QID) | RESPIRATORY_TRACT | 0 refills | Status: DC | PRN

## 2024-04-26 NOTE — Telephone Encounter (Signed)
 Copied from CRM #[id number]. Topic: Clinical - Medication Refill >> Apr 26, 2024  9:12 AM Gustabo D wrote: Medication:  albuterol  (VENTOLIN  HFA) 108 (90 Base) MCG/ACT inhaler    Has the patient contacted their pharmacy? Yes, no request (Agent: If no, request that the patient contact the pharmacy for the refill. If patient does not wish to contact the pharmacy document the reason why and proceed with request.) (Agent: If yes, when and what did the pharmacy advise?)  This is the patient's preferred pharmacy:  [id number] - [place],  - [address] AT [facility] [address] [place] [address] Phone: [phone] Fax: [phone]  Is this the correct pharmacy for this prescription? Yes If no, delete pharmacy and type the correct one.   Has the prescription been filled recently? No  Is the patient out of the medication? Yes been out for 3 days  Has the patient been seen for an appointment in the last year OR does the patient have an upcoming appointment? Yes  Can we respond through MyChart? No  Agent: Please be advised that Rx refills may take up to 3 business days. We ask that you follow-up with your pharmacy.

## 2024-04-26 NOTE — Telephone Encounter (Unsigned)
 Copied from CRM #[id number]. Topic: Clinical - Medication Refill >> Apr 26, 2024  9:10 AM Gustabo D wrote: Medication:  albuterol  (VENTOLIN  HFA) 108 (90 Base) MCG/ACT inhaler    Has the patient contacted their pharmacy? Yes , no request  (Agent: If no, request that the patient contact the pharmacy for the refill. If patient does not wish to contact the pharmacy document the reason why and proceed with request.) (Agent: If yes, when and what did the pharmacy advise?)  This is the patient's preferred pharmacy:  [id number] - [place], [place] - [address] AT [facility] [address] [place] [address] Phone: [phone] Fax: [phone]  Is this the correct pharmacy for this prescription? Yes If no, delete pharmacy and type the correct one.   Has the prescription been filled recently? No  Is the patient out of the medication? Yes  Has the patient been seen for an appointment in the last year OR does the patient have an upcoming appointment? Yes  Can we respond through MyChart? No  Agent: Please be advised that Rx refills may take up to 3 business days. We ask that you follow-up with your pharmacy.

## 2024-04-28 ENCOUNTER — Telehealth: Payer: Self-pay

## 2024-04-28 ENCOUNTER — Other Ambulatory Visit: Payer: Self-pay | Admitting: Emergency Medicine

## 2024-04-28 NOTE — Telephone Encounter (Signed)
 Okay for verbal

## 2024-04-28 NOTE — Telephone Encounter (Signed)
 Copied from CRM #[id number]. Topic: Clinical - Prescription Issue >> Apr 28, 2024 11:22 AM Suzen RAMAN wrote: Reason for CRM: [facility] verbal for BREO ELLIPTA  200-25 MCG/ACT AEPB; previous scripted  E-Prescribing Status: Transmission to pharmacy failed (04/25/2024  8:02 AM EST).    Please called:  [phone]

## 2024-04-28 NOTE — Telephone Encounter (Signed)
 Requesting verbal

## 2024-04-29 NOTE — Telephone Encounter (Signed)
 Called pharmacy and was able to give ok verbals on behalf of the patient to have this refilled

## 2024-05-24 ENCOUNTER — Telehealth: Payer: Self-pay

## 2024-05-24 ENCOUNTER — Other Ambulatory Visit: Payer: Self-pay | Admitting: Emergency Medicine

## 2024-05-24 MED ORDER — ALBUTEROL SULFATE HFA 108 (90 BASE) MCG/ACT IN AERS: 1.0000 | INHALATION_SPRAY | Freq: Four times a day (QID) | RESPIRATORY_TRACT | 0 refills | Status: DC | PRN

## 2024-05-24 NOTE — Telephone Encounter (Signed)
 Copied from CRM [id number]. Topic: Clinical - Medication Refill >> May 24, 2024 12:48 PM Thersia C wrote: Medication: albuterol  (VENTOLIN  HFA) 108 (90 Base) MCG/ACT inhaler  Has the patient contacted their pharmacy? Yes (Agent: If no, request that the patient contact the pharmacy for the refill. If patient does not wish to contact the pharmacy document the reason why and proceed with request.) (Agent: If yes, when and what did the pharmacy advise?)  This is the patient's preferred pharmacy:  [id number] - [place], [place] - [address] AT [facility] [address] [place] [address] Phone: [phone] Fax: [phone]  Is this the correct pharmacy for this prescription? Yes If no, delete pharmacy and type the correct one.   Has the prescription been filled recently? No  Is the patient out of the medication? Yes  Has the patient been seen for an appointment in the last year OR does the patient have an upcoming appointment? Yes  Can we respond through MyChart? Yes  Agent: Please be advised that Rx refills may take up to 3 business days. We ask that you follow-up with your pharmacy.

## 2024-05-24 NOTE — Telephone Encounter (Signed)
 Copied from CRM [id number]. Topic: Referral - Question >> May 24, 2024 12:46 PM Thersia BROCKS wrote: Reason for CRM: Patient called in stated she needs her provider or nurse to give Suberina at  [facility] a call regarding patient recent visit ,    [facility] Address: [address], [place], [place] [address]  Phone: [phone]

## 2024-05-27 NOTE — Telephone Encounter (Signed)
 I have called [facility] [place] today and there was no response. I left a voicemail for ms Sabrina to return my call in regards to the patient

## 2024-05-31 ENCOUNTER — Ambulatory Visit: Payer: Self-pay | Admitting: Emergency Medicine

## 2024-05-31 ENCOUNTER — Ambulatory Visit: Admitting: Emergency Medicine

## 2024-05-31 ENCOUNTER — Encounter: Payer: Self-pay | Admitting: Emergency Medicine

## 2024-05-31 VITALS — BP 126/84 | HR 83 | Temp 97.9°F | Ht 66.0 in | Wt 233.0 lb

## 2024-05-31 DIAGNOSIS — K649 Unspecified hemorrhoids: Secondary | ICD-10-CM | POA: Diagnosis not present

## 2024-05-31 DIAGNOSIS — Z7984 Long term (current) use of oral hypoglycemic drugs: Secondary | ICD-10-CM | POA: Diagnosis not present

## 2024-05-31 DIAGNOSIS — Z13220 Encounter for screening for lipoid disorders: Secondary | ICD-10-CM | POA: Diagnosis not present

## 2024-05-31 DIAGNOSIS — Z13228 Encounter for screening for other metabolic disorders: Secondary | ICD-10-CM

## 2024-05-31 DIAGNOSIS — Z0001 Encounter for general adult medical examination with abnormal findings: Secondary | ICD-10-CM

## 2024-05-31 DIAGNOSIS — E1165 Type 2 diabetes mellitus with hyperglycemia: Secondary | ICD-10-CM | POA: Diagnosis not present

## 2024-05-31 DIAGNOSIS — Z130 Encounter for screening for diseases of the blood and blood-forming organs and certain disorders involving the immune mechanism: Secondary | ICD-10-CM | POA: Diagnosis not present

## 2024-05-31 DIAGNOSIS — Z6837 Body mass index (BMI) 37.0-37.9, adult: Secondary | ICD-10-CM

## 2024-05-31 DIAGNOSIS — J4520 Mild intermittent asthma, uncomplicated: Secondary | ICD-10-CM

## 2024-05-31 DIAGNOSIS — Z1329 Encounter for screening for other suspected endocrine disorder: Secondary | ICD-10-CM | POA: Diagnosis not present

## 2024-05-31 DIAGNOSIS — Z0000 Encounter for general adult medical examination without abnormal findings: Secondary | ICD-10-CM

## 2024-05-31 LAB — COMPREHENSIVE METABOLIC PANEL WITH GFR
ALT: 21 U/L (ref 3–35)
AST: 24 U/L (ref 5–37)
Albumin: 4.3 g/dL (ref 3.5–5.2)
Alkaline Phosphatase: 62 U/L (ref 39–117)
BUN: 6 mg/dL (ref 6–23)
CO2: 32 meq/L (ref 19–32)
Calcium: 9.8 mg/dL (ref 8.4–10.5)
Chloride: 100 meq/L (ref 96–112)
Creatinine, Ser: 0.55 mg/dL (ref 0.40–1.20)
GFR: 92.04 mL/min
Glucose, Bld: 142 mg/dL — ABNORMAL HIGH (ref 70–99)
Potassium: 4 meq/L (ref 3.5–5.1)
Sodium: 138 meq/L (ref 135–145)
Total Bilirubin: 0.3 mg/dL (ref 0.2–1.2)
Total Protein: 7.9 g/dL (ref 6.0–8.3)

## 2024-05-31 LAB — VITAMIN B12: Vitamin B-12: 302 pg/mL (ref 211–911)

## 2024-05-31 LAB — LIPID PANEL
Cholesterol: 191 mg/dL (ref 28–200)
HDL: 55.5 mg/dL
LDL Cholesterol: 101 mg/dL — ABNORMAL HIGH (ref 10–99)
NonHDL: 135.48
Total CHOL/HDL Ratio: 3
Triglycerides: 172 mg/dL — ABNORMAL HIGH (ref 10.0–149.0)
VLDL: 34.4 mg/dL (ref 0.0–40.0)

## 2024-05-31 LAB — CBC WITH DIFFERENTIAL/PLATELET
Basophils Absolute: 0 K/uL (ref 0.0–0.1)
Basophils Relative: 0.4 % (ref 0.0–3.0)
Eosinophils Absolute: 0.2 K/uL (ref 0.0–0.7)
Eosinophils Relative: 4 % (ref 0.0–5.0)
HCT: 41.3 % (ref 36.0–46.0)
Hemoglobin: 13.8 g/dL (ref 12.0–15.0)
Lymphocytes Relative: 40.3 % (ref 12.0–46.0)
Lymphs Abs: 2.5 K/uL (ref 0.7–4.0)
MCHC: 33.3 g/dL (ref 30.0–36.0)
MCV: 86.8 fl (ref 78.0–100.0)
Monocytes Absolute: 0.5 K/uL (ref 0.1–1.0)
Monocytes Relative: 8.2 % (ref 3.0–12.0)
Neutro Abs: 2.9 K/uL (ref 1.4–7.7)
Neutrophils Relative %: 47.1 % (ref 43.0–77.0)
Platelets: 299 K/uL (ref 150.0–400.0)
RBC: 4.76 Mil/uL (ref 3.87–5.11)
RDW: 13.5 % (ref 11.5–15.5)
WBC: 6.1 K/uL (ref 4.0–10.5)

## 2024-05-31 LAB — HEMOGLOBIN A1C: Hgb A1c MFr Bld: 7.6 % — ABNORMAL HIGH (ref 4.6–6.5)

## 2024-05-31 NOTE — Progress Notes (Signed)
 Amy [name] 74 y.o.   Chief Complaint  Patient presents with   Annual Exam    Pt states that in the middle of buttock she states that it is painful but she states that it isnt her Hemorid but this has all happen after having her hip surgery done.    HISTORY OF PRESENT ILLNESS: This is a 74 y.o. female here for annual exam and follow-up on chronic medical conditions Since our last visit she had left hip surgery replacement.  Recovering well. Has intermittent pain to tailbone area No other complaints or medical concerns today.  HPI   Prior to Admission medications  Medication Sig Start Date End Date Taking? Authorizing Provider  acetaminophen  (TYLENOL ) 500 MG tablet Take 1 tablet (500 mg total) by mouth every 6 (six) hours as needed. 07/05/22  Yes Redwine, Madison A, PA-C  albuterol  (VENTOLIN  HFA) 108 (90 Base) MCG/ACT inhaler Inhale 1-2 puffs into the lungs every 6 (six) hours as needed (asthma). 05/24/24  Yes Waylon Hershey, Emil Schanz, MD  amoxicillin (AMOXIL) 500 MG capsule Take 500 mg by mouth 4 (four) times daily. 10/20/23  Yes [provider]  aspirin  81 MG chewable tablet Chew 1 tablet (81 mg total) by mouth 2 (two) times daily. 11/11/23  Yes Vernetta Lonni GRADE, MD  BREO ELLIPTA  [phone] MCG/ACT AEPB INHALE 1 PUFF BY MOUTH EVERY DAY 04/28/24  Yes Pedro Whiters, Emil Schanz, MD  Continuous Glucose Receiver (FREESTYLE LIBRE 3 READER) DEVI 1 Device by Does not apply route once for 1 dose. Use as directed to monitor patient blood glucose 09/24/22 05/31/24 Yes Naimah Yingst, Emil Schanz, MD  Hydrocortisone , Perianal, (PREPARATION H) 1 % CREA Place 1 Applicatorful rectally daily as needed (hemorrhoids).   Yes [provider]  ibuprofen  (ADVIL ) 600 MG tablet Take 1 tablet (600 mg total) by mouth every 6 (six) hours as needed. Patient taking differently: Take 220-440 mg by mouth every 6 (six) hours as needed for mild pain (pain score 1-3) or moderate pain (pain score 4-6). 07/05/22  Yes  Redwine, Madison A, PA-C  metFORMIN  (GLUCOPHAGE ) 1000 MG tablet TAKE 1 TABLET (1,000 MG TOTAL) BY MOUTH TWICE A DAY WITH FOOD Patient taking differently: Take 1,000 mg by mouth daily with breakfast. 10/14/23  Yes Curran Lenderman, Emil Schanz, MD  methocarbamol  (ROBAXIN ) 500 MG tablet Take 1 tablet (500 mg total) by mouth every 8 (eight) hours as needed for muscle spasms. 01/20/24  Yes Vernetta Lonni GRADE, MD  oxyCODONE  (OXY IR/ROXICODONE ) 5 MG immediate release tablet Take 1-2 tablets (5-10 mg total) by mouth every 6 (six) hours as needed for moderate pain (pain score 4-6) (pain score 4-6; 5 mg for pain score 4-5, 10 mg for pain score 6). 11/23/23  Yes Vernetta Lonni GRADE, MD  polyethylene glycol powder (GLYCOLAX /MIRALAX ) 17 GM/SCOOP powder Take 17 g by mouth daily as needed (constipation).   Yes [provider]  Continuous Glucose Sensor (FREESTYLE LIBRE 3 SENSOR) MISC Apply 1 senor to skin every 14 days to monitor blood glucose. Use as directed Patient not taking: Reported on 05/31/2024 09/24/22   Purcell Emil Schanz, MD  magnesium  citrate SOLN Take 1 Bottle by mouth once as needed for severe constipation. 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   Hemorrhoids 12/18/2020   Abnormal EKG 04/27/2019   Mild intermittent asthma 10/26/2017   Morbid (  severe) obesity due to excess calories (HCC) 09/29/2016   Diabetes (HCC) 08/10/2013   History of kidney stones    Colon polyp, hyperplastic 03/29/2010    Past Medical History:  Diagnosis Date   Arthritis    Right Hip   Colon polyp, hyperplastic 03/29/2010   Diabetes mellitus    History of kidney stones    Mild intermittent asthma    Pt denies    Past Surgical History:  Procedure Laterality Date   BREAST BIOPSY Right 04/13/2024   MM RT BREAST BX W LOC DEV 1ST LESION IMAGE BX SPEC  STEREO GUIDE 04/13/2024 [facility]   COLONOSCOPY     CYST EXCISION Left 15 years ago   Axillary    ECTOPIC PREGNANCY SURGERY     OVARIAN CYST REMOVAL  35 years ago   TOTAL ABDOMINAL HYSTERECTOMY  1984   TOTAL HIP ARTHROPLASTY Right 11/10/2023   Procedure: ARTHROPLASTY, HIP, TOTAL, ANTERIOR APPROACH;  Surgeon: Vernetta Lonni GRADE, MD;  Location: [facility] OR;  Service: Orthopedics;  Laterality: Right;    Social History   Socioeconomic History   Marital status: Married    Spouse name: Jimmy   Number of children: 2   Years of education: Not on file   Highest education level: Not on file  Occupational History   Occupation: RETIRED    Employer: [employer]  Tobacco Use   Smoking status: Former    Current packs/day: 0.00    Average packs/day: 0.5 packs/day for 12.0 years (6.0 ttl pk-yrs)    Types: Cigarettes    Start date: 05/12/1968    Quit date: 05/12/1980    Years since quitting: 44.0   Smokeless tobacco: Never  Vaping Use   Vaping status: Never Used  Substance and Sexual Activity   Alcohol use: Yes    Comment: socially   Drug use: No   Sexual activity: Yes    Partners: Male    Birth control/protection: Surgical    Comment: Hysterectomy  Other Topics Concern   Not on file  Social History Narrative   Lives with husband/2025   Social Drivers of Health   Tobacco Use: Medium Risk (05/31/2024)   Patient History    Smoking Tobacco Use: Former    Smokeless Tobacco Use: Never    Passive Exposure: Not on Actuary Strain: Low Risk (10/06/2023)   Overall Financial Resource Strain (CARDIA)    Difficulty of Paying Living Expenses: Not very hard  Food Insecurity: No Food Insecurity (10/06/2023)   Hunger Vital Sign    Worried About Running Out of Food in the Last Year: Never true    Ran Out of Food in the Last Year: Never true  Transportation Needs: No Transportation Needs (10/06/2023)   PRAPARE - Administrator, Civil Service (Medical): No     Lack of Transportation (Non-Medical): No  Physical Activity: Inactive (10/06/2023)   Exercise Vital Sign    Days of Exercise per Week: 0 days    Minutes of Exercise per Session: 0 min  Stress: No Stress Concern Present (10/06/2023)   [employer] of Occupational Health - Occupational Stress Questionnaire    Feeling of Stress : Not at all  Social Connections: Socially Integrated (10/06/2023)   Social Connection and Isolation Panel    Frequency of Communication with Friends and Family: Three times a week    Frequency of Social Gatherings with Friends and Family: Once a week    Attends Religious Services: More than 4 times  per year    Active Member of Clubs or Organizations: Yes    Attends Banker Meetings: Never    Marital Status: Married  Catering Manager Violence: Not At Risk (10/06/2023)   Humiliation, Afraid, Rape, and Kick questionnaire    Fear of Current or Ex-Partner: No    Emotionally Abused: No    Physically Abused: No    Sexually Abused: No  Depression (PHQ2-9): Low Risk (05/31/2024)   Depression (PHQ2-9)    PHQ-2 Score: 0  Alcohol Screen: Low Risk (10/06/2023)   Alcohol Screen    Last Alcohol Screening Score (AUDIT): 0  Housing: Unknown (05/24/2024)   Received from [facility]   Epic    Unable to Pay for Housing in the Last Year: Not on file    Number of Times Moved in the Last Year: Not on file    At any time in the past 12 months, were you homeless or living in a shelter (including now)?: No  Utilities: Not At Risk (10/06/2023)   [facility] Utilities    Threatened with loss of utilities: No  Health Literacy: Adequate Health Literacy (10/06/2023)   B1300 Health Literacy    Frequency of need for help with medical instructions: Never    Family History  Problem Relation Age of Onset   Hypertension Mother    Diabetes Father    Hypertension Sister    Allergies Sister    Allergies Daughter    Stomach cancer Maternal Grandmother    Heart  disease Maternal Grandfather    Heart attack Maternal Grandfather    Diabetes Brother    Breast cancer Neg Hx      Review of Systems  Constitutional: Negative.  Negative for chills and fever.  HENT: Negative.  Negative for congestion and sore throat.   Respiratory: Negative.  Negative for cough and shortness of breath.   Cardiovascular: Negative.  Negative for chest pain and palpitations.  Gastrointestinal:  Negative for abdominal pain, diarrhea, nausea and vomiting.  Genitourinary: Negative.  Negative for dysuria and hematuria.  Skin: Negative.  Negative for rash.  Neurological: Negative.  Negative for dizziness and headaches.    Vitals:   05/31/24 1317  BP: 126/84  Pulse: 83  Temp: 97.9 F (36.6 C)  SpO2: 96%    Physical Exam Vitals reviewed.  Constitutional:      Appearance: Normal appearance.  HENT:     Head: Normocephalic.     Right Ear: Tympanic membrane, ear canal and external ear normal.     Left Ear: Tympanic membrane, ear canal and external ear normal.     Mouth/Throat:     Mouth: Mucous membranes are moist.     Pharynx: Oropharynx is clear.  Eyes:     Extraocular Movements: Extraocular movements intact.     Conjunctiva/sclera: Conjunctivae normal.     Pupils: Pupils are equal, round, and reactive to light.  Cardiovascular:     Rate and Rhythm: Normal rate and regular rhythm.     Pulses: Normal pulses.     Heart sounds: Normal heart sounds.  Pulmonary:     Effort: Pulmonary effort is normal.     Breath sounds: Normal breath sounds.  Abdominal:     Palpations: Abdomen is soft.     Tenderness: There is no abdominal tenderness.  Musculoskeletal:     Cervical back: No tenderness.  Lymphadenopathy:     Cervical: No cervical adenopathy.  Skin:    General: Skin is warm and dry.  Capillary Refill: Capillary refill takes less than 2 seconds.  Neurological:     General: No focal deficit present.     Mental Status: She is alert and oriented to person,  place, and time.  Psychiatric:        Mood and Affect: Mood normal.        Behavior: Behavior normal.      ASSESSMENT & PLAN: Problem List Items Addressed This Visit       Cardiovascular and Mediastinum   Hemorrhoids   Stable.  Uses cream with success Recently evaluated by GI doc        Respiratory   Mild intermittent asthma   Well-controlled.  No concerns. Continues daily Breo Ellipta  1 puff.        Endocrine   Diabetes (HCC)   Well-controlled diabetes with hemoglobin A1c of 6.9 Continue metformin  1000 mg twice a day Diet and nutrition discussed Cardiovascular risk associated with diabetes discussed Follow-up in 6 months      Relevant Orders   CBC with Differential/Platelet   Comprehensive metabolic panel with GFR   Hemoglobin A1c   Lipid panel   Vitamin B12     Other   Morbid (severe) obesity due to excess calories (HCC)   Associated with diabetes Wt Readings from Last 3 Encounters:  05/31/24 233 lb (105.7 kg)  11/10/23 233 lb (105.7 kg)  11/03/23 224 lb 3.2 oz (101.7 kg)  Diet and nutrition discussed Benefits of exercise discussed Advised to decrease amount of daily carbohydrate intake and daily calories and increase amount of plant-based protein in her diet.       Relevant Orders   CBC with Differential/Platelet   Comprehensive metabolic panel with GFR   Hemoglobin A1c   Lipid panel   Vitamin B12   Other Visit Diagnoses       Encounter for general adult medical examination with abnormal findings    -  Primary   Relevant Orders   CBC with Differential/Platelet   Comprehensive metabolic panel with GFR   Hemoglobin A1c   Lipid panel   Vitamin B12     Screening for deficiency anemia       Relevant Orders   CBC with Differential/Platelet     Screening for lipoid disorders       Relevant Orders   Lipid panel     Screening for endocrine, metabolic and immunity disorder       Relevant Orders   Comprehensive metabolic panel with GFR    Hemoglobin A1c   Vitamin B12      Modifiable risk factors discussed with patient. Anticipatory guidance according to age provided. The following topics were also discussed: Social Determinants of Health Smoking.  Non-smoker Diet and nutrition Benefits of exercise Cancer screening and need for colon cancer screening with colonoscopy this year Vaccinations review and recommendations Cardiovascular risk assessment Mental health including depression and anxiety Fall and accident prevention  Patient Instructions  Health Maintenance After Age 3 After age 41, you are at a higher risk for certain long-term diseases and infections as well as injuries from falls. Falls are a major cause of broken bones and head injuries in people who are older than age 88. Getting regular preventive care can help to keep you healthy and well. Preventive care includes getting regular testing and making lifestyle changes as recommended by your health care provider. Talk with your health care provider about: Which screenings and tests you should have. A screening is a test that checks for  a disease when you have no symptoms. A diet and exercise plan that is right for you. What should I know about screenings and tests to prevent falls? Screening and testing are the best ways to find a health problem early. Early diagnosis and treatment give you the best chance of managing medical conditions that are common after age 65. Certain conditions and lifestyle choices may make you more likely to have a fall. Your health care provider may recommend: Regular vision checks. Poor vision and conditions such as cataracts can make you more likely to have a fall. If you wear glasses, make sure to get your prescription updated if your vision changes. Medicine review. Work with your health care provider to regularly review all of the medicines you are taking, including over-the-counter medicines. Ask your health care provider about any  side effects that may make you more likely to have a fall. Tell your health care provider if any medicines that you take make you feel dizzy or sleepy. Strength and balance checks. Your health care provider may recommend certain tests to check your strength and balance while standing, walking, or changing positions. Foot health exam. Foot pain and numbness, as well as not wearing proper footwear, can make you more likely to have a fall. Screenings, including: Osteoporosis screening. Osteoporosis is a condition that causes the bones to get weaker and break more easily. Blood pressure screening. Blood pressure changes and medicines to control blood pressure can make you feel dizzy. Depression screening. You may be more likely to have a fall if you have a fear of falling, feel depressed, or feel unable to do activities that you used to do. Alcohol use screening. Using too much alcohol can affect your balance and may make you more likely to have a fall. Follow these instructions at home: Lifestyle Do not drink alcohol if: Your health care provider tells you not to drink. If you drink alcohol: Limit how much you have to: 0-1 drink a day for women. 0-2 drinks a day for men. Know how much alcohol is in your drink. In the [place] one drink equals one 12 oz bottle of beer (355 mL), one 5 oz glass of wine (148 mL), or one 1 oz glass of hard liquor (44 mL). Do not use any products that contain nicotine or tobacco. These products include cigarettes, chewing tobacco, and vaping devices, such as e-cigarettes. If you need help quitting, ask your health care provider. Activity  Follow a regular exercise program to stay fit. This will help you maintain your balance. Ask your health care provider what types of exercise are appropriate for you. If you need a cane or walker, use it as recommended by your health care provider. Wear supportive shoes that have nonskid soles. Safety  Remove any tripping hazards,  such as rugs, cords, and clutter. Install safety equipment such as grab bars in bathrooms and safety rails on stairs. Keep rooms and walkways well-lit. General instructions Talk with your health care provider about your risks for falling. Tell your health care provider if: You fall. Be sure to tell your health care provider about all falls, even ones that seem minor. You feel dizzy, tiredness (fatigue), or off-balance. Take over-the-counter and prescription medicines only as told by your health care provider. These include supplements. Eat a healthy diet and maintain a healthy weight. A healthy diet includes low-fat dairy products, low-fat (lean) meats, and fiber from whole grains, beans, and lots of fruits and vegetables. Stay current with  your vaccines. Schedule regular health, dental, and eye exams. Summary Having a healthy lifestyle and getting preventive care can help to protect your health and wellness after age 54. Screening and testing are the best way to find a health problem early and help you avoid having a fall. Early diagnosis and treatment give you the best chance for managing medical conditions that are more common for people who are older than age 66. Falls are a major cause of broken bones and head injuries in people who are older than age 51. Take precautions to prevent a fall at home. Work with your health care provider to learn what changes you can make to improve your health and wellness and to prevent falls. This information is not intended to replace advice given to you by your health care provider. Make sure you discuss any questions you have with your health care provider. Document Revised: 09/17/2020 Document Reviewed: 09/17/2020 Elsevier Patient Education  2024 Elsevier Inc.     Emil Schaumann, MD [facility]    [1] No Known Allergies

## 2024-05-31 NOTE — Assessment & Plan Note (Signed)
 Stable.  Uses cream with success Recently evaluated by GI doc

## 2024-05-31 NOTE — Patient Instructions (Signed)
 Health Maintenance After Age 72 After age 27, you are at a higher risk for certain long-term diseases and infections as well as injuries from falls. Falls are a major cause of broken bones and head injuries in people who are older than age 73. Getting regular preventive care can help to keep you healthy and well. Preventive care includes getting regular testing and making lifestyle changes as recommended by your health care provider. Talk with your health care provider about: Which screenings and tests you should have. A screening is a test that checks for a disease when you have no symptoms. A diet and exercise plan that is right for you. What should I know about screenings and tests to prevent falls? Screening and testing are the best ways to find a health problem early. Early diagnosis and treatment give you the best chance of managing medical conditions that are common after age 90. Certain conditions and lifestyle choices may make you more likely to have a fall. Your health care provider may recommend: Regular vision checks. Poor vision and conditions such as cataracts can make you more likely to have a fall. If you wear glasses, make sure to get your prescription updated if your vision changes. Medicine review. Work with your health care provider to regularly review all of the medicines you are taking, including over-the-counter medicines. Ask your health care provider about any side effects that may make you more likely to have a fall. Tell your health care provider if any medicines that you take make you feel dizzy or sleepy. Strength and balance checks. Your health care provider may recommend certain tests to check your strength and balance while standing, walking, or changing positions. Foot health exam. Foot pain and numbness, as well as not wearing proper footwear, can make you more likely to have a fall. Screenings, including: Osteoporosis screening. Osteoporosis is a condition that causes  the bones to get weaker and break more easily. Blood pressure screening. Blood pressure changes and medicines to control blood pressure can make you feel dizzy. Depression screening. You may be more likely to have a fall if you have a fear of falling, feel depressed, or feel unable to do activities that you used to do. Alcohol  use screening. Using too much alcohol  can affect your balance and may make you more likely to have a fall. Follow these instructions at home: Lifestyle Do not drink alcohol  if: Your health care provider tells you not to drink. If you drink alcohol : Limit how much you have to: 0-1 drink a day for women. 0-2 drinks a day for men. Know how much alcohol  is in your drink. In the [place] one drink equals one 12 oz bottle of beer (355 mL), one 5 oz glass of wine (148 mL), or one 1 oz glass of hard liquor (44 mL). Do not use any products that contain nicotine or tobacco. These products include cigarettes, chewing tobacco, and vaping devices, such as e-cigarettes. If you need help quitting, ask your health care provider. Activity  Follow a regular exercise program to stay fit. This will help you maintain your balance. Ask your health care provider what types of exercise are appropriate for you. If you need a cane or walker, use it as recommended by your health care provider. Wear supportive shoes that have nonskid soles. Safety  Remove any tripping hazards, such as rugs, cords, and clutter. Install safety equipment such as grab bars in bathrooms and safety rails on stairs. Keep rooms and walkways  well-lit. General instructions Talk with your health care provider about your risks for falling. Tell your health care provider if: You fall. Be sure to tell your health care provider about all falls, even ones that seem minor. You feel dizzy, tiredness (fatigue), or off-balance. Take over-the-counter and prescription medicines only as told by your health care provider. These include  supplements. Eat a healthy diet and maintain a healthy weight. A healthy diet includes low-fat dairy products, low-fat (lean) meats, and fiber from whole grains, beans, and lots of fruits and vegetables. Stay current with your vaccines. Schedule regular health, dental, and eye exams. Summary Having a healthy lifestyle and getting preventive care can help to protect your health and wellness after age 15. Screening and testing are the best way to find a health problem early and help you avoid having a fall. Early diagnosis and treatment give you the best chance for managing medical conditions that are more common for people who are older than age 42. Falls are a major cause of broken bones and head injuries in people who are older than age 64. Take precautions to prevent a fall at home. Work with your health care provider to learn what changes you can make to improve your health and wellness and to prevent falls. This information is not intended to replace advice given to you by your health care provider. Make sure you discuss any questions you have with your health care provider. Document Revised: 09/17/2020 Document Reviewed: 09/17/2020 Elsevier Patient Education  2024 [employer].

## 2024-05-31 NOTE — Assessment & Plan Note (Signed)
 Associated with diabetes Wt Readings from Last 3 Encounters:  05/31/24 233 lb (105.7 kg)  11/10/23 233 lb (105.7 kg)  11/03/23 224 lb 3.2 oz (101.7 kg)  Diet and nutrition discussed Benefits of exercise discussed Advised to decrease amount of daily carbohydrate intake and daily calories and increase amount of plant-based protein in her diet.

## 2024-05-31 NOTE — Assessment & Plan Note (Signed)
Well-controlled.  No concerns. Continues daily Breo Ellipta 1 puff.

## 2024-05-31 NOTE — Assessment & Plan Note (Signed)
 Well-controlled diabetes with hemoglobin A1c of 6.9 Continue metformin 1000 mg twice a day Diet and nutrition discussed Cardiovascular risk associated with diabetes discussed Follow-up in 6 months

## 2024-06-09 ENCOUNTER — Telehealth: Payer: Self-pay

## 2024-06-09 NOTE — Telephone Encounter (Signed)
 Copied from CRM #[id number]. Topic: Clinical - Medical Advice >> Jun 09, 2024  9:53 AM Charolett L wrote: Reason for CRM: Patient called in and stated that Sabrina from Dr.White's office at [facility] is requesting a call from the pcp office to get approval CB# [phone]

## 2024-06-17 ENCOUNTER — Other Ambulatory Visit: Payer: Self-pay | Admitting: Emergency Medicine

## 2024-10-06 ENCOUNTER — Ambulatory Visit
